# Patient Record
Sex: Male | Born: 1980
Health system: Southern US, Community
[De-identification: ages and names within clinical notes are randomized; demographics above are authoritative.]

## PROBLEM LIST (undated history)

## (undated) DIAGNOSIS — Z0000 Encounter for general adult medical examination without abnormal findings: Secondary | ICD-10-CM

## (undated) DIAGNOSIS — E785 Hyperlipidemia, unspecified: Secondary | ICD-10-CM

## (undated) DIAGNOSIS — K219 Gastro-esophageal reflux disease without esophagitis: Secondary | ICD-10-CM

## (undated) DIAGNOSIS — F71 Moderate intellectual disabilities: Secondary | ICD-10-CM

## (undated) DIAGNOSIS — R109 Unspecified abdominal pain: Secondary | ICD-10-CM

## (undated) DIAGNOSIS — Z22322 Carrier or suspected carrier of Methicillin resistant Staphylococcus aureus: Secondary | ICD-10-CM

## (undated) DIAGNOSIS — R319 Hematuria, unspecified: Secondary | ICD-10-CM

## (undated) DIAGNOSIS — R634 Abnormal weight loss: Secondary | ICD-10-CM

## (undated) DIAGNOSIS — M810 Age-related osteoporosis without current pathological fracture: Secondary | ICD-10-CM

## (undated) HISTORY — DX: Encounter for general adult medical examination without abnormal findings: Z00.00

## (undated) HISTORY — DX: Unspecified abdominal pain: R10.9

## (undated) HISTORY — DX: Hematuria, unspecified: R31.9

## (undated) HISTORY — DX: Gastro-esophageal reflux disease without esophagitis: K21.9

## (undated) HISTORY — DX: Hyperlipidemia, unspecified: E78.5

## (undated) HISTORY — DX: Carrier or suspected carrier of methicillin resistant Staphylococcus aureus: Z22.322

## (undated) HISTORY — DX: Age-related osteoporosis without current pathological fracture: M81.0

## (undated) HISTORY — PX: HIP SURGERY: SHX245

## (undated) HISTORY — DX: Abnormal weight loss: R63.4

---

## 2008-10-11 ENCOUNTER — Emergency Department (HOSPITAL_COMMUNITY): Admission: EM | Admit: 2008-10-11 | Discharge: 2008-10-11 | Payer: Self-pay | Admitting: Emergency Medicine

## 2009-06-24 ENCOUNTER — Emergency Department (HOSPITAL_COMMUNITY): Admission: EM | Admit: 2009-06-24 | Discharge: 2009-06-24 | Payer: Self-pay | Admitting: Emergency Medicine

## 2009-11-18 ENCOUNTER — Emergency Department (HOSPITAL_COMMUNITY): Admission: EM | Admit: 2009-11-18 | Discharge: 2009-11-18 | Payer: Self-pay | Admitting: Emergency Medicine

## 2010-01-20 ENCOUNTER — Emergency Department (HOSPITAL_COMMUNITY): Admission: EM | Admit: 2010-01-20 | Discharge: 2010-01-20 | Payer: Self-pay | Admitting: Emergency Medicine

## 2010-06-24 ENCOUNTER — Encounter: Payer: Self-pay | Admitting: Emergency Medicine

## 2010-08-19 LAB — COMPREHENSIVE METABOLIC PANEL
ALT: 22 U/L (ref 0–53)
Albumin: 3.9 g/dL (ref 3.5–5.2)
Alkaline Phosphatase: 44 U/L (ref 39–117)
CO2: 29 mEq/L (ref 19–32)
Calcium: 9.3 mg/dL (ref 8.4–10.5)
Creatinine, Ser: 0.92 mg/dL (ref 0.4–1.5)
GFR calc non Af Amer: >60 mL/min (ref >60)
Sodium: 137 mEq/L (ref 135–145)
Total Protein: 6.4 g/dL (ref 6.0–8.3)

## 2010-08-19 LAB — DIFFERENTIAL
Basophils Absolute: 0.1 10*3/uL (ref 0.0–0.1)
Basophils Absolute: 0 10*3/uL (ref 0.0–0.1)
Basophils Relative: 1 % (ref 0–1)
Basophils Relative: 0 % (ref 0–1)
Lymphocytes Relative: 28 % (ref 12–46)
Lymphs Abs: 1.5 10*3/uL (ref 0.7–4.0)
Monocytes Relative: 8 % (ref 3–12)
Neutrophils Relative %: 61 % (ref 43–77)

## 2010-08-19 LAB — CBC
HCT: 44.4 % (ref 39.0–52.0)
Hemoglobin: 15.3 g/dL (ref 13.0–17.0)
Hemoglobin: 14.5 g/dL (ref 13.0–17.0)
MCHC: 34.3 g/dL (ref 30.0–36.0)
Platelets: 237 10*3/uL (ref 150–400)
Platelets: 227 10*3/uL (ref 150–400)
RDW: 12.8 % (ref 11.5–15.5)
RDW: 12.8 % (ref 11.5–15.5)
WBC: 5.2 10*3/uL (ref 4.0–10.5)
WBC: 6.7 10*3/uL (ref 4.0–10.5)

## 2010-08-19 LAB — BASIC METABOLIC PANEL
BUN: 8 mg/dL (ref 6–23)
Calcium: 9.3 mg/dL (ref 8.4–10.5)
Chloride: 106 mEq/L (ref 96–112)
GFR calc Af Amer: >60 mL/min (ref >60)
Potassium: 3.6 mEq/L (ref 3.5–5.1)

## 2010-08-19 LAB — URINE MICROSCOPIC-ADD ON

## 2010-08-19 LAB — URINALYSIS, ROUTINE W REFLEX MICROSCOPIC
Protein, ur: NEGATIVE mg/dL
Specific Gravity, Urine: 1.02 (ref 1.005–1.030)
pH: 7 (ref 5.0–8.0)

## 2010-08-19 LAB — POCT CARDIAC MARKERS: Troponin i, poc: <0.05 ng/mL (ref 0.00–0.09)

## 2011-02-16 ENCOUNTER — Encounter: Payer: Self-pay | Admitting: *Deleted

## 2011-02-16 ENCOUNTER — Emergency Department (HOSPITAL_COMMUNITY)
Admission: EM | Admit: 2011-02-16 | Discharge: 2011-02-16 | Disposition: A | Discharge status: Home / Self Care | Payer: Medicaid Other | Attending: Emergency Medicine | Admitting: Emergency Medicine

## 2011-02-16 DIAGNOSIS — J45909 Unspecified asthma, uncomplicated: Secondary | ICD-10-CM | POA: Insufficient documentation

## 2011-02-16 DIAGNOSIS — F79 Unspecified intellectual disabilities: Secondary | ICD-10-CM | POA: Insufficient documentation

## 2011-02-16 DIAGNOSIS — L02419 Cutaneous abscess of limb, unspecified: Secondary | ICD-10-CM

## 2011-02-16 DIAGNOSIS — L539 Erythematous condition, unspecified: Secondary | ICD-10-CM | POA: Insufficient documentation

## 2011-02-16 DIAGNOSIS — E119 Type 2 diabetes mellitus without complications: Secondary | ICD-10-CM | POA: Insufficient documentation

## 2011-02-16 HISTORY — DX: Moderate intellectual disabilities: F71

## 2011-02-16 MED ORDER — DOXYCYCLINE HYCLATE 100 MG PO TABS
100.0000 mg | ORAL_TABLET | Freq: Once | ORAL | Status: AC
  Administered 2011-02-16: 100 mg via ORAL
  Filled 2011-02-16: qty 1

## 2011-02-16 MED ORDER — DOXYCYCLINE HYCLATE 100 MG PO CAPS: 100.0000 mg | ORAL_CAPSULE | Freq: Two times a day (BID) | ORAL | Status: DC

## 2011-02-16 MED ORDER — BACITRACIN ZINC 500 UNIT/GM EX OINT
TOPICAL_OINTMENT | Freq: Once | CUTANEOUS | Status: AC
  Administered 2011-02-16: 17:00:00 via TOPICAL
  Filled 2011-02-16: qty 0.9

## 2011-02-16 NOTE — ED Provider Notes (Signed)
History     CSN: [id number] Arrival date & time: 02/16/2011  3:31 PM   Chief Complaint  Patient presents with  . Abscess     (Include location/radiation/quality/duration/timing/severity/associated sxs/prior treatment) HPI Comments: Mom concerned that lesion may be MRSA.  Patient is a 30 y.o. male presenting with abscess. The history is provided by the patient and a relative. No language interpreter was used.  Abscess  This is a new problem. Episode onset: several days ago. The problem has been gradually improving. The abscess is present on the left lower leg. The problem is mild. Incident location: at a group home. Pertinent negatives include no fever.     Past Medical History  Diagnosis Date  . MR (mental retardation), moderate   . Diabetes mellitus   . Asthma      History reviewed. No pertinent past surgical history.  History reviewed. No pertinent family history.  History  Substance Use Topics  . Smoking status: Never Smoker   . Smokeless tobacco: Not on file  . Alcohol Use: No      Review of Systems  Constitutional: Negative for fever and chills.  Skin: Positive for wound.  All other systems reviewed and are negative.    Allergies  Other  Home Medications   Current Outpatient Rx  Name Route Sig Dispense Refill  . ALBUTEROL SULFATE HFA 108 (90 BASE) MCG/ACT IN AERS Inhalation Inhale 2 puffs into the lungs every 4 (four) hours as needed. Shortness of breath/wheezing     . METFORMIN HCL 500 MG PO TABS Oral Take 500 mg by mouth 2 (two) times daily.      Marland Kitchen MONTELUKAST SODIUM 10 MG PO TABS Oral Take 10 mg by mouth at bedtime.      . OMEPRAZOLE 40 MG PO CPDR Oral Take 40 mg by mouth daily.        Physical Exam    BP 127/80  Pulse 92  Temp(Src) 97.7 F (36.5 C) (Oral)  Resp 20  Ht 5\' 9"  (1.753 m)  Wt 197 lb (89.359 kg)  BMI 29.09 kg/m2  SpO2 99%  Physical Exam  Nursing note and vitals reviewed. Constitutional: He is oriented to person, place, and  time. Vital signs are normal. He appears well-developed and well-nourished.  HENT:  Head: Normocephalic and atraumatic.  Right Ear: External ear normal.  Left Ear: External ear normal.  Nose: Nose normal.  Mouth/Throat: No oropharyngeal exudate.  Eyes: Conjunctivae and EOM are normal. Pupils are equal, round, and reactive to light. Right eye exhibits no discharge. Left eye exhibits no discharge. No scleral icterus.  Neck: Normal range of motion. Neck supple. No JVD present. No tracheal deviation present. No thyromegaly present.  Cardiovascular: Normal rate, regular rhythm, normal heart sounds, intact distal pulses and normal pulses.  Exam reveals no gallop and no friction rub.   No murmur heard. Pulmonary/Chest: Effort normal and breath sounds normal. No stridor. No respiratory distress. He has no wheezes. He has no rales. He exhibits no tenderness.  Abdominal: Soft. Normal appearance and bowel sounds are normal. He exhibits no distension and no mass. There is no tenderness. There is no rebound and no guarding.  Musculoskeletal: Normal range of motion. He exhibits no edema and no tenderness.       Left lower leg: He exhibits tenderness. He exhibits no swelling, no deformity and no laceration.       Legs: Lymphadenopathy:    He has no cervical adenopathy.  Neurological: He is alert and oriented  to person, place, and time. He has normal reflexes. No cranial nerve deficit. Coordination normal. GCS eye subscore is 4. GCS verbal subscore is 5. GCS motor subscore is 6.  Skin: Skin is warm and dry. No rash noted. He is not diaphoretic.  Psychiatric: He has a normal mood and affect. His speech is normal and behavior is normal. Judgment and thought content normal. Cognition and memory are normal.    ED Course  Procedures  Results for orders placed during the hospital encounter of 11/18/09  BASIC METABOLIC PANEL      Component Value Range   Sodium 138  135 - 145 (mEq/L)   Potassium 3.6  3.5 - 5.1  (mEq/L)   Chloride 106  96 - 112 (mEq/L)   CO2 25  19 - 32 (mEq/L)   Glucose, Bld 247 (*) 70 - 99 (mg/dL)   BUN 8  6 - 23 (mg/dL)   Creatinine, Ser 4.78  0.4 - 1.5 (mg/dL)   Calcium 9.3  8.4 - 29.5 (mg/dL)   GFR calc non Af Amer >60  >60 (mL/min)   GFR calc Af Amer    >60 (mL/min)   Value: >60            The eGFR has been calculated     using the MDRD equation.     This calculation has not been     validated in all clinical     situations.     eGFR's persistently     <60 mL/min signify     possible Chronic Kidney Disease.  CBC      Component Value Range   WBC 6.7  4.0 - 10.5 (K/uL)   RBC 4.79  4.22 - 5.81 (MIL/uL)   Hemoglobin 14.5  13.0 - 17.0 (g/dL)   HCT 62.1  30.8 - 65.7 (%)   MCV 88.8  78.0 - 100.0 (fL)   MCHC 34.1  30.0 - 36.0 (g/dL)   RDW 84.6  96.2 - 95.2 (%)   Platelets 227  150 - 400 (K/uL)  DIFFERENTIAL      Component Value Range   Neutrophils Relative 61  43 - 77 (%)   Neutro Abs 4.1  1.7 - 7.7 (K/uL)   Lymphocytes Relative 28  12 - 46 (%)   Lymphs Abs 1.9  0.7 - 4.0 (K/uL)   Monocytes Relative 8  3 - 12 (%)   Monocytes Absolute 0.5  0.1 - 1.0 (K/uL)   Eosinophils Relative 3  0 - 5 (%)   Eosinophils Absolute 0.2  0.0 - 0.7 (K/uL)   Basophils Relative 0  0 - 1 (%)   Basophils Absolute 0.0  0.0 - 0.1 (K/uL)  URINALYSIS, ROUTINE W REFLEX MICROSCOPIC      Component Value Range   Color, Urine YELLOW  YELLOW    Appearance HAZY (*) CLEAR    Specific Gravity, Urine 1.020  1.005 - 1.030    pH 7.0  5.0 - 8.0    Glucose, UA >1000 (*) NEGATIVE (mg/dL)   Hgb urine dipstick NEGATIVE  NEGATIVE    Bilirubin Urine NEGATIVE  NEGATIVE    Ketones, ur TRACE (*) NEGATIVE (mg/dL)   Protein, ur NEGATIVE  NEGATIVE (mg/dL)   Urobilinogen, UA 0.2  0.0 - 1.0 (mg/dL)   Nitrite NEGATIVE  NEGATIVE    Leukocytes, UA NEGATIVE  NEGATIVE   URINE MICROSCOPIC-ADD ON      Component Value Range   Squamous Epithelial / LPF RARE  RARE  WBC, UA 0-2  <3 (WBC/hpf)   RBC / HPF 0-2  <3  (RBC/hpf)   Bacteria, UA RARE  RARE    Crystals AMORPHOUS PHOSPHATES (*) NEGATIVE   POCT CARDIAC MARKERS      Component Value Range   Myoglobin, poc 26.6  12 - 200 (ng/mL)   CKMB, poc <1.0 (*) 1.0 - 8.0 (ng/mL)   Troponin i, poc <0.05  0.00 - 0.09 (ng/mL)   Comment       Value:            TROPONIN VALUES IN THE RANGE     OF 0.00-0.09 ng/mL SHOW     NO INDICATION OF     MYOCARDIAL INJURY.                PERSISTENTLY INCREASED TROPONIN     VALUES IN THE RANGE OF 0.10-0.24     ng/mL CAN BE SEEN IN:           -UNSTABLE ANGINA           -CONGESTIVE HEART FAILURE           -MYOCARDITIS           -CHEST TRAUMA           -ARRYHTHMIAS           -LATE PRESENTING MI           -COPD       CLINICAL FOLLOW-UP RECOMMENDED.                TROPONIN VALUES >=0.25 ng/mL     INDICATE POSSIBLE MYOCARDIAL     ISCHEMIA. SERIAL TESTING     RECOMMENDED.   No results found.   No diagnosis found.   MDM        Worthy Rancher, PA 02/16/11 [phone]

## 2011-02-16 NOTE — ED Notes (Signed)
Pt has red swollen area to lle on back side of leg. Pts mother states pt lives at group home and does not know how long it has been there.

## 2011-02-16 NOTE — ED Provider Notes (Signed)
Medical screening examination/treatment/procedure(s) were performed by non-physician practitioner and as supervising physician I was immediately available for consultation/collaboration.  Adam Hutching, MD 02/16/11 Paulo Fruit

## 2011-06-14 ENCOUNTER — Ambulatory Visit: Payer: Medicare Other | Attending: Family Medicine | Admitting: Physical Therapy

## 2011-06-14 DIAGNOSIS — R42 Dizziness and giddiness: Secondary | ICD-10-CM | POA: Insufficient documentation

## 2011-06-14 DIAGNOSIS — IMO0001 Reserved for inherently not codable concepts without codable children: Secondary | ICD-10-CM | POA: Diagnosis not present

## 2011-06-19 ENCOUNTER — Encounter: Payer: Medicare Other | Admitting: Rehabilitative and Restorative Service Providers"

## 2011-06-24 ENCOUNTER — Ambulatory Visit: Payer: Medicare Other | Admitting: Rehabilitative and Restorative Service Providers"

## 2011-06-24 DIAGNOSIS — R42 Dizziness and giddiness: Secondary | ICD-10-CM | POA: Diagnosis not present

## 2011-06-24 DIAGNOSIS — IMO0001 Reserved for inherently not codable concepts without codable children: Secondary | ICD-10-CM | POA: Diagnosis not present

## 2011-06-26 ENCOUNTER — Ambulatory Visit: Payer: Medicare Other | Admitting: Rehabilitative and Restorative Service Providers"

## 2011-06-26 DIAGNOSIS — R42 Dizziness and giddiness: Secondary | ICD-10-CM | POA: Diagnosis not present

## 2011-06-26 DIAGNOSIS — IMO0001 Reserved for inherently not codable concepts without codable children: Secondary | ICD-10-CM | POA: Diagnosis not present

## 2011-07-03 ENCOUNTER — Encounter: Payer: Medicare Other | Admitting: Physical Therapy

## 2011-07-05 ENCOUNTER — Ambulatory Visit: Payer: Medicare Other | Attending: Family Medicine | Admitting: Physical Therapy

## 2011-07-05 DIAGNOSIS — IMO0001 Reserved for inherently not codable concepts without codable children: Secondary | ICD-10-CM | POA: Diagnosis not present

## 2011-07-05 DIAGNOSIS — R42 Dizziness and giddiness: Secondary | ICD-10-CM | POA: Insufficient documentation

## 2011-07-08 ENCOUNTER — Encounter: Payer: Medicare Other | Admitting: Rehabilitative and Restorative Service Providers"

## 2011-07-10 ENCOUNTER — Ambulatory Visit: Payer: Medicare Other | Admitting: Rehabilitative and Restorative Service Providers"

## 2011-07-12 ENCOUNTER — Encounter: Payer: Medicare Other | Admitting: Rehabilitative and Restorative Service Providers"

## 2011-07-15 ENCOUNTER — Encounter: Payer: Medicare Other | Admitting: Rehabilitative and Restorative Service Providers"

## 2011-07-17 ENCOUNTER — Encounter: Payer: Medicare Other | Admitting: Physical Therapy

## 2011-08-01 DIAGNOSIS — R42 Dizziness and giddiness: Secondary | ICD-10-CM | POA: Diagnosis not present

## 2011-08-01 DIAGNOSIS — H9390 Unspecified disorder of ear, unspecified ear: Secondary | ICD-10-CM | POA: Diagnosis not present

## 2011-10-23 ENCOUNTER — Telehealth: Payer: Self-pay | Admitting: *Deleted

## 2011-10-23 ENCOUNTER — Ambulatory Visit (INDEPENDENT_AMBULATORY_CARE_PROVIDER_SITE_OTHER): Payer: Medicare Other | Admitting: Family Medicine

## 2011-10-23 ENCOUNTER — Encounter: Payer: Self-pay | Admitting: Family Medicine

## 2011-10-23 DIAGNOSIS — IMO0001 Reserved for inherently not codable concepts without codable children: Secondary | ICD-10-CM

## 2011-10-23 DIAGNOSIS — K219 Gastro-esophageal reflux disease without esophagitis: Secondary | ICD-10-CM | POA: Diagnosis not present

## 2011-10-23 DIAGNOSIS — J45909 Unspecified asthma, uncomplicated: Secondary | ICD-10-CM | POA: Insufficient documentation

## 2011-10-23 DIAGNOSIS — F79 Unspecified intellectual disabilities: Secondary | ICD-10-CM

## 2011-10-23 DIAGNOSIS — R319 Hematuria, unspecified: Secondary | ICD-10-CM | POA: Diagnosis not present

## 2011-10-23 DIAGNOSIS — E119 Type 2 diabetes mellitus without complications: Secondary | ICD-10-CM

## 2011-10-23 LAB — CBC WITH DIFFERENTIAL/PLATELET
Eosinophils Relative: 4.2 % (ref 0.0–5.0)
Lymphs Abs: 1.5 10*3/uL (ref 0.7–4.0)
Monocytes Relative: 7.4 % (ref 3.0–12.0)
Neutro Abs: 2.8 10*3/uL (ref 1.4–7.7)
Neutrophils Relative %: 57.9 % (ref 43.0–77.0)
WBC: 4.9 10*3/uL (ref 4.5–10.5)

## 2011-10-23 LAB — BASIC METABOLIC PANEL
BUN: 8 mg/dL (ref 6–23)
Creatinine, Ser: 0.7 mg/dL (ref 0.4–1.5)
GFR: 150.75 mL/min (ref >60.00)
Glucose, Bld: 76 mg/dL (ref 70–99)
Potassium: 5 mEq/L (ref 3.5–5.1)
Sodium: 142 mEq/L (ref 135–145)

## 2011-10-23 LAB — POCT URINALYSIS DIPSTICK
Ketones, UA: NEGATIVE
Nitrite, UA: NEGATIVE
Urobilinogen, UA: 0.2

## 2011-10-23 LAB — HEPATIC FUNCTION PANEL
ALT: 13 U/L (ref 0–53)
Total Bilirubin: 1.9 mg/dL — ABNORMAL HIGH (ref 0.3–1.2)
Total Protein: 7.3 g/dL (ref 6.0–8.3)

## 2011-10-23 LAB — LIPID PANEL
Cholesterol: 161 mg/dL (ref 0–200)
HDL: 46.3 mg/dL (ref >39.00)
Total CHOL/HDL Ratio: 3
Triglycerides: 85 mg/dL (ref 0.0–149.0)

## 2011-10-23 LAB — TSH: TSH: 1.37 u[IU]/mL (ref 0.35–5.50)

## 2011-10-23 MED ORDER — OMEPRAZOLE 20 MG PO CPDR: 20.0000 mg | DELAYED_RELEASE_CAPSULE | Freq: Every day | ORAL | Status: DC

## 2011-10-23 NOTE — Assessment & Plan Note (Signed)
New to provider.  Chronic for pt.  On ARB for renal protection.  UTD on eye exam.  On metformin.  Uncertain as to level of control.  Due for A1C.  Pt now very active and eating well- has lost considerable amount of weight.  Applauded his efforts.  Will follow closely.

## 2011-10-23 NOTE — Patient Instructions (Signed)
Follow up in 3-4 months We'll notify you of your lab results When he needs refills have the pharmacy contact me Take the Omeprazole 20mg  x1 month and then stop We'll call you with your urology appt Call with any questions or concerns Welcome!  We're glad to have you!

## 2011-10-23 NOTE — Assessment & Plan Note (Signed)
New to provider.  Will decrease to 20mg  omeprazole and if sxs remain well controlled will d/c.  Will follow.

## 2011-10-23 NOTE — Assessment & Plan Note (Signed)
New to provider.  Chronic for pt.  Well controlled on Singulair.  Rarely using albuterol inhaler.  Will continue to follow.

## 2011-10-23 NOTE — Assessment & Plan Note (Signed)
New.  High functioning.  Aunt is legal guardian.  Living in group home.

## 2011-10-23 NOTE — Assessment & Plan Note (Signed)
New to provider.  Recurrent for pt.  Pt reports frank blood last week.  Has not seen urology.  UA unremarkable today but given report will refer to uro for complete evaluation.  Pt and aunt in agreement.

## 2011-10-23 NOTE — Progress Notes (Signed)
  Subjective:    Patient ID: Adam Fitzpatrick, male    DOB: Oct 28, 1980, 31 y.o.   MRN: [id number]  HPI New to establish.  Previous MD- [place]ard.  Mental retardation- lives in group home in [place].  Celine Ahr is guardian.  Mom is addicted to prescription drugs and pt was removed from home.  Hematuria- 2 days ago told grandmother he had blood in urine.  Reportedly bright red, a lot, and intermittent.  Occasional dysuria.  Had w/u 1 yr ago in PCP office but never saw urology.  DM- dx'd 5 yrs ago.  On Metformin BID.  Has lost over 50lbs in 2 yrs.  Has completely changed diet and is now involved in [employer] (biking).  CBGs running 70-100s w/ one value of 64.  On low dose ARB for renal protection.  UTD on eye exam.  Asthma- chronic problem, has rarely required albuterol since leaving home (parents were smokers).  Taking Singulair daily.  Denies SOB, wheezing.  No difficulty w/ exercise.  GERD- was started on Omeprazole for abd pain.  Was previously on 20mg , aunt feels like the 40mg  dose may have been increased in error.  Would like to drop back to 20mg  daily.  Denies current sxs- pain, sour brash.   Review of Systems For ROS see HPI     Objective:   Physical Exam  Vitals reviewed. Constitutional: He is oriented to person, place, and time. He appears well-developed and well-nourished. No distress.  HENT:  Head: Normocephalic and atraumatic.  Eyes: Conjunctivae and EOM are normal. Pupils are equal, round, and reactive to light.  Neck: Normal range of motion. Neck supple. No thyromegaly present.  Cardiovascular: Normal rate, regular rhythm, normal heart sounds and intact distal pulses.   No murmur heard. Pulmonary/Chest: Effort normal and breath sounds normal. No respiratory distress.  Abdominal: Soft. Bowel sounds are normal. He exhibits no distension.  Musculoskeletal: He exhibits no edema.  Lymphadenopathy:    He has no cervical adenopathy.  Neurological: He is alert and oriented  to person, place, and time. No cranial nerve deficit.  Skin: Skin is warm and dry.  Psychiatric: He has a normal mood and affect. His behavior is normal.          Assessment & Plan:

## 2011-10-23 NOTE — Telephone Encounter (Signed)
Received incoming call from Terri with [facility] noting new RX for omeprazole 20mg , per notes pt has enough of the 40mg  to last til the end of the month and wanted to know if pt can wait to change til the end of the month to save from paying another co-pay, advised per MD Beverely Low it is ok for the pt to start the new rx of 20mg  Omeprazole the first of next month.

## 2011-10-25 ENCOUNTER — Encounter: Payer: Self-pay | Admitting: *Deleted

## 2011-10-25 ENCOUNTER — Telehealth: Payer: Self-pay | Admitting: *Deleted

## 2011-10-25 LAB — CULTURE, URINE COMPREHENSIVE: Colony Count: NO GROWTH

## 2011-10-25 NOTE — Telephone Encounter (Signed)
Pt aunt left vm stating she understood the results that this nurse read to her this am, however wanted to ask if MD Beverely Low thinks that the pt should still have the Urology apt per noted elevated bilirubin and wondered if the blood in the urine was from bilrubin spill over? Called pt aunt back to discuss, however number noted busy several times when calling, will call again to advise MD Beverely Low is out of the office til 10-30-11 and see if she wants to wait til she gets back to decide? Will try to call again

## 2011-10-29 ENCOUNTER — Telehealth: Payer: Self-pay

## 2011-10-29 NOTE — Telephone Encounter (Signed)
Spoke with patient's aunt Lura Em, Lura Em verbalized understanding of culture results.

## 2011-10-29 NOTE — Telephone Encounter (Signed)
Message copied by Maurice Small on Tue Oct 29, 2011  4:25 PM ------      Message from: Sheliah Hatch      Created: Tue Oct 29, 2011  7:33 AM       No evidence of infxn

## 2011-11-01 NOTE — Telephone Encounter (Signed)
Pt should proceed w/ urology appt b/c the UA tests for protein and bilirubin in the urine (which is how the elevated bilirubin would present) and he notes seeing bright red blood.

## 2011-11-01 NOTE — Telephone Encounter (Signed)
Please advise 

## 2011-11-01 NOTE — Telephone Encounter (Signed)
.  left message to have patient return my call.  

## 2011-11-04 NOTE — Telephone Encounter (Signed)
.  left message to have patient return my call.  

## 2011-11-07 NOTE — Telephone Encounter (Signed)
Several attempts have been made to contact pt with no resolve, sent letter to pt address noted in chart with results/instructions/prescriptions. Advised pt to call office if any questions or concerns per letter. Noted in letter mailed for pt to keep his apt with urology on 11-22-11 per MD Tabori advice

## 2011-11-22 DIAGNOSIS — B356 Tinea cruris: Secondary | ICD-10-CM | POA: Diagnosis not present

## 2011-11-22 DIAGNOSIS — R310 Gross hematuria: Secondary | ICD-10-CM | POA: Diagnosis not present

## 2011-11-26 DIAGNOSIS — R310 Gross hematuria: Secondary | ICD-10-CM | POA: Diagnosis not present

## 2011-11-26 DIAGNOSIS — K7689 Other specified diseases of liver: Secondary | ICD-10-CM | POA: Diagnosis not present

## 2012-03-26 ENCOUNTER — Telehealth: Payer: Self-pay

## 2012-03-26 MED ORDER — OMEPRAZOLE 20 MG PO CPDR: 20.0000 mg | DELAYED_RELEASE_CAPSULE | Freq: Every day | ORAL | Status: DC

## 2012-03-26 NOTE — Telephone Encounter (Signed)
Spoke with Ms. Pruitt when she called in concerning pt. Pruitt asked should she d/c Prilosec or continue after using the one I just sent in? Pruitt explained their is a family history of pt's issue and fears if taking off Prilosec he may have problems at group home. ( I believe based off our conversation Cresenciano Genre is asking this because pt ran out of meds and thought once ran out that meant d/c med and you wouldn't approve anymore.) Plz advise     MW

## 2012-03-26 NOTE — Telephone Encounter (Signed)
.  left message to have patient return my call.  

## 2012-03-26 NOTE — Telephone Encounter (Signed)
Pt should continue to Omeprazole 20mg  daily

## 2012-03-30 NOTE — Telephone Encounter (Signed)
.  left message to have patient return my call on mobile

## 2012-03-31 NOTE — Telephone Encounter (Signed)
.  left message to have patient return my call on pt home number

## 2012-04-03 NOTE — Telephone Encounter (Signed)
Noted waiver in pt chart signed to allow detailed messages to be left on voicemail, left detailed message about: results/instructions/prescribtion information. Advise if any further concerns or questions please call our office at [phone]. Advised directions to take the omeprazole 20mg  daily, to aunt Celene Kras per DPR to leave detailed message on home line and to call office with any further assistance needed

## 2012-05-07 DIAGNOSIS — Z23 Encounter for immunization: Secondary | ICD-10-CM | POA: Diagnosis not present

## 2012-07-23 ENCOUNTER — Other Ambulatory Visit: Payer: Self-pay | Admitting: Family Medicine

## 2012-10-15 ENCOUNTER — Ambulatory Visit (INDEPENDENT_AMBULATORY_CARE_PROVIDER_SITE_OTHER): Payer: Medicare Other | Admitting: Family Medicine

## 2012-10-15 ENCOUNTER — Encounter: Payer: Self-pay | Admitting: Family Medicine

## 2012-10-15 VITALS — BP 104/70 | HR 62 | Temp 98.2°F | Ht 68.5 in | Wt 149.6 lb

## 2012-10-15 DIAGNOSIS — M549 Dorsalgia, unspecified: Secondary | ICD-10-CM | POA: Diagnosis not present

## 2012-10-15 DIAGNOSIS — R21 Rash and other nonspecific skin eruption: Secondary | ICD-10-CM | POA: Diagnosis not present

## 2012-10-15 DIAGNOSIS — Z0000 Encounter for general adult medical examination without abnormal findings: Secondary | ICD-10-CM

## 2012-10-15 DIAGNOSIS — E119 Type 2 diabetes mellitus without complications: Secondary | ICD-10-CM

## 2012-10-15 HISTORY — DX: Encounter for general adult medical examination without abnormal findings: Z00.00

## 2012-10-15 MED ORDER — NAPROXEN 500 MG PO TABS: 500.0000 mg | ORAL_TABLET | Freq: Two times a day (BID) | ORAL | Status: DC

## 2012-10-15 MED ORDER — CLOTRIMAZOLE-BETAMETHASONE 1-0.05 % EX CREA: TOPICAL_CREAM | Freq: Two times a day (BID) | CUTANEOUS | Status: DC

## 2012-10-15 MED ORDER — TIZANIDINE HCL 4 MG PO TABS: 4.0000 mg | ORAL_TABLET | Freq: Three times a day (TID) | ORAL | Status: DC | PRN

## 2012-10-15 NOTE — Assessment & Plan Note (Signed)
Pt's PE WNL w/ exception of tinea cruris and known kyphosis/scoliosis.  Check labs.  Anticipatory guidance provided.

## 2012-10-15 NOTE — Assessment & Plan Note (Signed)
Chronic problem.  Tolerating metformin w/out difficulty.  On ARB for renal protection.  Asymptomatic.  Foot exam WNL.  Encouraged him to schedule eye exam.  Check labs.  Adjust meds prn

## 2012-10-15 NOTE — Patient Instructions (Addendum)
Follow up in 6 months to recheck diabetes Start the Naproxen twice daily x10 days and then as needed for back pain- take w/ food Use the Zanaflex 3x/day as needed for muscle spasm/back pain Apply the cream to the groin twice daily for the fungus and the itching We'll notify you of your lab results and make any changes if needed Schedule an eye exam Call with any questions or concerns Have a great summer!

## 2012-10-15 NOTE — Assessment & Plan Note (Signed)
New.  Tinea cruris.  Start Lotrisone.

## 2012-10-15 NOTE — Assessment & Plan Note (Signed)
New.  Pt w/ known kyphosis and scoliosis.  No distress today.  Will add scheduled NSAIDs and muscle relaxer TID.  Suspect sxs flared w/ swimming.  Will follow.

## 2012-10-15 NOTE — Progress Notes (Signed)
  Subjective:    Patient ID: Adam Fitzpatrick, male    DOB: [birth year], 32 y.o.   MRN: [id number]  HPI Here today for CPE.  Risk Factors: DM- chronic problem, on Metformin twice daily.  CBGs ranging 70-130s.  Due for eye exam.  No symptomatic lows.  No CP, SOB, HAs, visual changes, edema.  + numbness and cold feeling to both feet. Back pain- upper back and mid thoracic, + kyphosis and scoliosis.  Has been swimming for [employer].  sxs x6 weeks.  Has stopped swimming recently.  Has tried tylenol and advil for pain w/out relief. Groin rash- hx of similar previously.  Was treated w/ topical ointment.  Very itchy.  Pt unable to relay duration of rash. Physical Activity: exercising regularly Fall Risk: low Depression: no current sxs Hearing: normal hearing ADL's: can perform tasks independently w/ direction Cognitive: mental retardation Home Safety: lives at group home, feels safe Height, Weight, BMI, Visual Acuity: see vitals, vision corrected to 20/20 w/ glasses Counseling: due for eye exam Labs Ordered: See A&P Care Plan: See A&P    Review of Systems Patient reports no vision/hearing changes, anorexia, fever ,adenopathy, persistant/recurrent hoarseness, swallowing issues, chest pain, palpitations, edema, persistant/recurrent cough, hemoptysis, dyspnea (rest,exertional, paroxysmal nocturnal), gastrointestinal  bleeding (melena, rectal bleeding), abdominal pain, excessive heart burn, GU symptoms (dysuria, hematuria, voiding/incontinence issues) syncope, focal weakness, memory loss, skin/hair/nail changes, depression, anxiety, abnormal bruising/bleeding, musculoskeletal symptoms/signs.     Objective:   Physical Exam BP 104/70  Pulse 62  Temp(Src) 98.2 F (36.8 C) (Oral)  Ht 5' 8.5" (1.74 m)  Wt 149 lb 9.6 oz (67.858 kg)  BMI 22.41 kg/m2  SpO2 98%  General Appearance:    Alert, cooperative, no distress, appears stated age  Head:    Normocephalic, without obvious abnormality,  atraumatic  Eyes:    PERRL, conjunctiva/corneas clear, EOM's intact, fundi    benign, both eyes       Ears:    Normal TM's and external ear canals, both ears  Nose:   Nares normal, septum midline, mucosa normal, no drainage   or sinus tenderness  Throat:   Lips, mucosa, and tongue normal; poor dentition  Neck:   Supple, symmetrical, trachea midline, no adenopathy;       thyroid:  No enlargement/tenderness/nodules  Back:     Kyphosis and scoliosis present w/ paraspinal muscle tenderness and spasm  Lungs:     Clear to auscultation bilaterally, respirations unlabored  Chest wall:    No tenderness or deformity  Heart:    Regular rate and rhythm, S1 and S2 normal, no murmur, rub   or gallop  Abdomen:     Soft, non-tender, bowel sounds active all four quadrants,    no masses, no organomegaly  Genitalia:    deferred  Rectal:    Extremities:   Extremities normal, atraumatic, no cyanosis or edema  Pulses:   2+ and symmetric all extremities  Skin:   Skin color, texture, turgor normal, tinear cruris present  Lymph nodes:   Cervical, supraclavicular, and axillary nodes normal  Neurologic:   CNII-XII intact. Normal strength, sensation and reflexes      throughout          Assessment & Plan:

## 2012-10-16 ENCOUNTER — Encounter: Payer: Self-pay | Admitting: *Deleted

## 2012-10-16 LAB — LIPID PANEL
Cholesterol: 157 mg/dL (ref 0–200)
LDL Cholesterol: 94 mg/dL (ref 0–99)
Total CHOL/HDL Ratio: 4

## 2012-10-16 LAB — CBC WITH DIFFERENTIAL/PLATELET
Basophils Absolute: 0 10*3/uL (ref 0.0–0.1)
Basophils Relative: 0.3 % (ref 0.0–3.0)
Hemoglobin: 14.8 g/dL (ref 13.0–17.0)
Lymphocytes Relative: 21.8 % (ref 12.0–46.0)
Monocytes Relative: 7.1 % (ref 3.0–12.0)
Neutro Abs: 4.9 10*3/uL (ref 1.4–7.7)
RBC: 4.8 Mil/uL (ref 4.22–5.81)
RDW: 12.6 % (ref 11.5–14.6)

## 2012-10-16 LAB — BASIC METABOLIC PANEL
CO2: 31 mEq/L (ref 19–32)
Chloride: 101 mEq/L (ref 96–112)
Creatinine, Ser: 0.7 mg/dL (ref 0.4–1.5)

## 2012-10-16 LAB — HEPATIC FUNCTION PANEL
ALT: 16 U/L (ref 0–53)
AST: 14 U/L (ref 0–37)
Bilirubin, Direct: 0.1 mg/dL (ref 0.0–0.3)
Total Protein: 7.3 g/dL (ref 6.0–8.3)

## 2012-10-16 LAB — HEMOGLOBIN A1C: Hgb A1c MFr Bld: 6.1 % (ref 4.6–6.5)

## 2012-12-10 ENCOUNTER — Other Ambulatory Visit: Payer: Self-pay | Admitting: Family Medicine

## 2013-01-29 ENCOUNTER — Other Ambulatory Visit: Payer: Self-pay | Admitting: General Practice

## 2013-04-08 ENCOUNTER — Other Ambulatory Visit: Payer: Self-pay

## 2013-05-04 DIAGNOSIS — E119 Type 2 diabetes mellitus without complications: Secondary | ICD-10-CM | POA: Diagnosis not present

## 2013-07-05 ENCOUNTER — Encounter (HOSPITAL_COMMUNITY): Payer: Self-pay | Admitting: Emergency Medicine

## 2013-07-05 ENCOUNTER — Emergency Department (INDEPENDENT_AMBULATORY_CARE_PROVIDER_SITE_OTHER)
Admission: EM | Admit: 2013-07-05 | Discharge: 2013-07-05 | Disposition: A | Discharge status: Home / Self Care | Payer: Medicare Other | Source: Home / Self Care

## 2013-07-05 DIAGNOSIS — J069 Acute upper respiratory infection, unspecified: Secondary | ICD-10-CM

## 2013-07-05 MED ORDER — AZITHROMYCIN 250 MG PO TABS: ORAL_TABLET | ORAL | Status: DC

## 2013-07-05 MED ORDER — IPRATROPIUM BROMIDE 0.06 % NA SOLN: 2.0000 | Freq: Four times a day (QID) | NASAL | Status: DC

## 2013-07-05 NOTE — Discharge Instructions (Signed)
Drink plenty of fluids as discussed, use medicine as prescribed- z-pack 2 tabs today then 1 for next 4 days, and atrovent nasal spray 2 sprays each nostril 4 times a day, and delsym 2tsp every 12 hrs for cough. Return or see your doctor if further problems

## 2013-07-05 NOTE — ED Provider Notes (Signed)
CSN: [id number]     Arrival date & time 07/05/13  1931 History   None    Chief Complaint  Patient presents with  . Influenza   (Consider location/radiation/quality/duration/timing/severity/associated sxs/prior Treatment) Patient is a 33 y.o. male presenting with flu symptoms. The history is provided by the patient and a relative.  Influenza Presenting symptoms: cough, fever and rhinorrhea   Presenting symptoms: no nausea, no sore throat and no vomiting   Severity:  Mild Onset quality:  Gradual Duration:  11 days Progression:  Partially resolved Chronicity:  New Associated symptoms: nasal congestion   Risk factors: sick contacts   Risk factors comment:  Lives in a group home.   Past Medical History  Diagnosis Date  . MR (mental retardation), moderate   . Diabetes mellitus   . Asthma   . GERD (gastroesophageal reflux disease)   . Hyperlipidemia     notes only hx of hyperlipidemia  . Blood in urine    History reviewed. No pertinent past surgical history. Family History  Problem Relation Age of Onset  . Alcohol abuse Father   . Cancer Maternal Grandmother   . Hyperlipidemia Maternal Grandmother   . Heart disease Maternal Grandmother   . Hypertension Maternal Grandmother   . Heart disease Maternal Grandfather   . Hypertension Maternal Grandfather   . Hyperlipidemia Maternal Grandfather   . Hyperlipidemia Paternal Grandmother   . Heart disease Paternal Grandmother   . Hypertension Paternal Grandmother   . Kidney disease Paternal Grandmother   . Diabetes Paternal Grandmother   . Heart disease Paternal Grandfather   . Hypertension Paternal Grandfather   . Hyperlipidemia Paternal Grandfather    History  Substance Use Topics  . Smoking status: Never Smoker   . Smokeless tobacco: Not on file  . Alcohol Use: No    Review of Systems  Constitutional: Positive for fever. Negative for appetite change.  HENT: Positive for congestion, postnasal drip, rhinorrhea and sinus  pressure. Negative for sore throat.   Respiratory: Positive for cough.   Cardiovascular: Negative.   Gastrointestinal: Negative.  Negative for nausea and vomiting.    Allergies  Other  Home Medications   Current Outpatient Rx  Name  Route  Sig  Dispense  Refill  . albuterol (PROVENTIL HFA;VENTOLIN HFA) 108 (90 BASE) MCG/ACT inhaler   Inhalation   Inhale 2 puffs into the lungs every 4 (four) hours as needed. Shortness of breath/wheezing          . losartan (COZAAR) 25 MG tablet               . metFORMIN (GLUCOPHAGE) 500 MG tablet   Oral   Take 500 mg by mouth 2 (two) times daily.           . montelukast (SINGULAIR) 10 MG tablet   Oral   Take 10 mg by mouth at bedtime.           Marland Kitchen omeprazole (PRILOSEC) 20 MG capsule      TAKE 1 CAPSULE BY MOUTH ONCE DAILY.   31 capsule   5   . acetaminophen (TYLENOL) 500 MG tablet   Oral   Take 500 mg by mouth every 4 (four) hours as needed for pain.         Marland Kitchen azithromycin (ZITHROMAX Z-PAK) 250 MG tablet      Take as directed on pack   6 each   0   . guaiFENesin-dextromethorphan (ROBITUSSIN DM) 100-10 MG/5ML syrup   Oral   Take  15 mLs by mouth every 4 (four) hours as needed for cough. Take 15cc by mouth every 4 hours prn.         . ipratropium (ATROVENT) 0.06 % nasal spray   Each Nare   Place 2 sprays into both nostrils 4 (four) times daily.   15 mL   1   . loperamide (IMODIUM A-D) 2 MG tablet   Oral   Take 2 mg by mouth as needed for diarrhea or loose stools. Take 2 caplets po or 4 tsp liquid po after first loose stool and 1 caplet po or 1 tsp liquid po for each subsequent stool.         . naproxen (NAPROSYN) 500 MG tablet      TAKE 1 TABLET BY MOUTH TWICE DAILY WITH A MEAL.   62 tablet   0   . tiZANidine (ZANAFLEX) 4 MG tablet   Oral   Take 1 tablet (4 mg total) by mouth every 8 (eight) hours as needed.   60 tablet   1    BP 132/89  Pulse 89  Temp(Src) 98.4 F (36.9 C) (Oral)  Resp 16  SpO2  100% Physical Exam  Nursing note and vitals reviewed. Constitutional: He is oriented to person, place, and time. He appears well-developed and well-nourished.  HENT:  Head: Normocephalic.  Right Ear: External ear normal.  Left Ear: External ear normal.  Mouth/Throat: Oropharynx is clear and moist.  Eyes: Conjunctivae are normal. Pupils are equal, round, and reactive to light.  Neck: Normal range of motion. Neck supple.  Cardiovascular: Normal rate, regular rhythm, normal heart sounds and intact distal pulses.   Pulmonary/Chest: Effort normal and breath sounds normal.  Abdominal: Soft. Bowel sounds are normal. There is no tenderness.  Lymphadenopathy:    He has no cervical adenopathy.  Neurological: He is alert and oriented to person, place, and time.  Skin: Skin is warm and dry.    ED Course  Procedures (including critical care time) Labs Review Labs Reviewed - No data to display Imaging Review No results found.    MDM      Billy Fischer, MD 07/05/13 2046

## 2013-07-05 NOTE — ED Notes (Signed)
C/o  Productive cough with yellow sputum.  Headache.  Sinus pressure and pain.  Diarrhea.  Increase in blood sugars, hx of DM.  Pt has been taking tylenol for fever.  Denies n/v.  Symptoms present x 11 days.

## 2013-08-13 ENCOUNTER — Encounter: Payer: Self-pay | Admitting: Family Medicine

## 2013-08-13 ENCOUNTER — Ambulatory Visit (INDEPENDENT_AMBULATORY_CARE_PROVIDER_SITE_OTHER): Payer: Medicare Other | Admitting: Family Medicine

## 2013-08-13 VITALS — BP 110/74 | HR 71 | Temp 98.4°F | Wt 150.0 lb

## 2013-08-13 DIAGNOSIS — H6590 Unspecified nonsuppurative otitis media, unspecified ear: Secondary | ICD-10-CM

## 2013-08-13 MED ORDER — LORATADINE 10 MG PO TABS: 10.0000 mg | ORAL_TABLET | Freq: Every day | ORAL | Status: DC

## 2013-08-13 MED ORDER — ACETIC ACID 2 % OT SOLN: 4.0000 [drp] | Freq: Three times a day (TID) | OTIC | Status: DC

## 2013-08-13 MED ORDER — AMOXICILLIN-POT CLAVULANATE 875-125 MG PO TABS: 1.0000 | ORAL_TABLET | Freq: Two times a day (BID) | ORAL | Status: DC

## 2013-08-13 MED ORDER — FLUTICASONE PROPIONATE 50 MCG/ACT NA SUSP: 2.0000 | Freq: Every day | NASAL | Status: DC

## 2013-08-13 NOTE — Progress Notes (Signed)
Pre visit review using our clinic review tool, if applicable. No additional management support is needed unless otherwise documented below in the visit note. 

## 2013-08-13 NOTE — Progress Notes (Signed)
  Subjective:     Adam Fitzpatrick is a 33 y.o. male who presents with ear pain and possible ear infection. Symptoms include: bilateral ear pain and plugged sensation in both ears. Onset of symptoms was several weeks ago, and have been gradually worsening since that time. Associated symptoms include: none.  Patient denies: achiness, chills, congestion, coryza, fever , headache, low grade fever, non productive cough, post nasal drip, productive cough, sinus pressure, sneezing and sore throat. He is drinking plenty of fluids.  The following portions of the patient's history were reviewed and updated as appropriate: allergies, current medications, past family history, past medical history, past social history, past surgical history and problem list.  Review of Systems Pertinent items are noted in HPI.   Objective:    BP 110/74  Pulse 71  Temp(Src) 98.4 F (36.9 C) (Oral)  Wt 150 lb (68.04 kg)  SpO2 95% General:  alert, cooperative, appears stated age and no distress  Right Ear: diminished mobility,  Canal--+ swelling and errythema  Left Ear: diminished mobility-- + canal swelling and errythema  Mouth:  lips, mucosa, and tongue normal; teeth and gums normal  Neck: no adenopathy, supple, symmetrical, trachea midline and thyroid not enlarged, symmetric, no tenderness/mass/nodules     Assessment:    Bilateral acute serous otitis media   Plan:    Treatment: Augmentin.-- nasacort and claritin OTC analgesia as needed. Fluids, rest, avoid carbonated/alcoholic and caffeinated beverages.  Follow up in a few days if not improving.

## 2013-08-13 NOTE — Patient Instructions (Signed)
Serous Otitis Media  Serous otitis media is fluid in the middle ear space. This space contains the bones for hearing and air. Air in the middle ear space helps to transmit sound.  The air gets there through the eustachian tube. This tube goes from the back of the nose (nasopharynx) to the middle ear space. It keeps the pressure in the middle ear the same as the outside world. It also helps to drain fluid from the middle ear space. CAUSES  Serous otitis media occurs when the eustachian tube gets blocked. Blockage can come from:  Ear infections.  Colds and other upper respiratory infections.  Allergies.  Irritants such as cigarette smoke.  Sudden changes in air pressure (such as descending in an airplane).  Enlarged adenoids.  A mass in the nasopharynx. During colds and upper respiratory infections, the middle ear space can become temporarily filled with fluid. This can happen after an ear infection also. Once the infection clears, the fluid will generally drain out of the ear through the eustachian tube. If it does not, then serous otitis media occurs. SIGNS AND SYMPTOMS   Hearing loss.  A feeling of fullness in the ear, without pain.  Young children may not show any symptoms but may show slight behavioral changes, such as agitation, ear pulling, or crying. DIAGNOSIS  Serous otitis media is diagnosed by an ear exam. Tests may be done to check on the movement of the eardrum. Hearing exams may also be done. TREATMENT  The fluid most often goes away without treatment. If allergy is the cause, allergy treatment may be helpful. Fluid that persists for several months may require minor surgery. A small tube is placed in the eardrum to:  Drain the fluid.  Restore the air in the middle ear space. In certain situations, antibiotics are used to avoid surgery. Surgery may be done to remove enlarged adenoids (if this is the cause). HOME CARE INSTRUCTIONS   Keep children away from tobacco  smoke.  Be sure to keep any follow-up appointments. SEEK MEDICAL CARE IF:   Your hearing is not better in 3 months.  Your hearing is worse.  You have ear pain.  You have drainage from the ear.  You have dizziness.  You have serous otitis media only in one ear or have any bleeding from your nose (epistaxis).  You notice a lump on your neck. MAKE SURE YOU:  Understand these instructions.   Will watch your condition.   Will get help right away if you are not doing well or get worse.  Document Released: 08/10/2003 Document Revised: 01/20/2013 Document Reviewed: 12/15/2012 [employer] Patient Information 2014 [employer].  

## 2013-09-21 ENCOUNTER — Ambulatory Visit: Payer: Medicare Other | Admitting: Family Medicine

## 2013-10-06 ENCOUNTER — Encounter: Payer: Self-pay | Admitting: Family Medicine

## 2013-10-06 ENCOUNTER — Ambulatory Visit (INDEPENDENT_AMBULATORY_CARE_PROVIDER_SITE_OTHER): Payer: Medicare Other | Admitting: Family Medicine

## 2013-10-06 VITALS — BP 108/80 | HR 77 | Temp 98.2°F | Resp 16 | Wt 150.4 lb

## 2013-10-06 DIAGNOSIS — Z593 Problems related to living in residential institution: Secondary | ICD-10-CM | POA: Insufficient documentation

## 2013-10-06 DIAGNOSIS — E119 Type 2 diabetes mellitus without complications: Secondary | ICD-10-CM | POA: Diagnosis not present

## 2013-10-06 LAB — HEMOGLOBIN A1C: Hgb A1c MFr Bld: 8.2 % — ABNORMAL HIGH (ref 4.6–6.5)

## 2013-10-06 LAB — HEPATIC FUNCTION PANEL
ALBUMIN: 4.2 g/dL (ref 3.5–5.2)
ALT: 13 U/L (ref 0–53)
AST: 14 U/L (ref 0–37)
Alkaline Phosphatase: 51 U/L (ref 39–117)
BILIRUBIN TOTAL: 1.6 mg/dL — AB (ref 0.2–1.2)
Bilirubin, Direct: 0.2 mg/dL (ref 0.0–0.3)
Total Protein: 7.1 g/dL (ref 6.0–8.3)

## 2013-10-06 LAB — BASIC METABOLIC PANEL
BUN: 7 mg/dL (ref 6–23)
CO2: 27 mEq/L (ref 19–32)
CREATININE: 0.7 mg/dL (ref 0.4–1.5)
Calcium: 9.5 mg/dL (ref 8.4–10.5)
Chloride: 102 mEq/L (ref 96–112)
GFR: 130.42 mL/min (ref >60.00)
GLUCOSE: 97 mg/dL (ref 70–99)
Potassium: 3.4 mEq/L — ABNORMAL LOW (ref 3.5–5.1)
Sodium: 137 mEq/L (ref 135–145)

## 2013-10-06 LAB — LIPID PANEL
CHOLESTEROL: 155 mg/dL (ref 0–200)
HDL: 42.9 mg/dL (ref >39.00)
LDL CALC: 92 mg/dL (ref 0–99)
Total CHOL/HDL Ratio: 4
Triglycerides: 102 mg/dL (ref 0.0–149.0)
VLDL: 20.4 mg/dL (ref 0.0–40.0)

## 2013-10-06 LAB — TSH: TSH: 0.71 u[IU]/mL (ref 0.35–4.50)

## 2013-10-06 NOTE — Patient Instructions (Signed)
Schedule your complete physical in 4 months We'll notify you of your lab results and make any changes if needed Keep up the good work! Happy Spring!!

## 2013-10-06 NOTE — Progress Notes (Signed)
Pre visit review using our clinic review tool, if applicable. No additional management support is needed unless otherwise documented below in the visit note. 

## 2013-10-06 NOTE — Assessment & Plan Note (Signed)
Chronic problem.  Pt overdue on labs.  UTD on eye exam.  On ARB for renal protection.  Asymptomatic.  Check labs.  Adjust meds prn.

## 2013-10-06 NOTE — Progress Notes (Signed)
   Subjective:    Patient ID: Adam Fitzpatrick, male    DOB: 10/08/1980, 33 y.o.   MRN: [id number]  HPI DM- chronic problem, on Metformin.  On ARB for renal protection.  Lives in a group home and gets CBGs checked regularly- typically sugars run 90-100s.  Denies symptomatic lows.  UTD on eye exam.  No CP, SOB, HAs, visual changes, edema, numbness/tingling hands.   No N/V/D.  Care management- pt needs FL2 and care plans updated today.   Review of Systems For ROS see HPI     Objective:   Physical Exam  Vitals reviewed. Constitutional: He is oriented to person, place, and time. He appears well-developed and well-nourished. No distress.  HENT:  Head: Normocephalic and atraumatic.  Eyes: Conjunctivae and EOM are normal. Pupils are equal, round, and reactive to light.  Neck: Normal range of motion. Neck supple. No thyromegaly present.  Cardiovascular: Normal rate, regular rhythm, normal heart sounds and intact distal pulses.   No murmur heard. Pulmonary/Chest: Effort normal and breath sounds normal. No respiratory distress.  Abdominal: Soft. Bowel sounds are normal. He exhibits no distension.  Musculoskeletal: He exhibits no edema.  Lymphadenopathy:    He has no cervical adenopathy.  Neurological: He is alert and oriented to person, place, and time. No cranial nerve deficit.  Skin: Skin is warm and dry.  Psychiatric: He has a normal mood and affect. His behavior is normal.          Assessment & Plan:

## 2013-10-06 NOTE — Assessment & Plan Note (Signed)
Completed new FL2, signed care plan and orders.

## 2013-10-06 NOTE — Progress Notes (Signed)
Called the aunt to advise of results.

## 2013-10-08 ENCOUNTER — Encounter: Payer: Self-pay | Admitting: General Practice

## 2013-10-08 MED ORDER — METFORMIN HCL 1000 MG PO TABS: 1000.0000 mg | ORAL_TABLET | Freq: Two times a day (BID) | ORAL | Status: DC

## 2013-10-12 ENCOUNTER — Telehealth: Payer: Self-pay | Admitting: Family Medicine

## 2013-10-12 ENCOUNTER — Encounter: Payer: Self-pay | Admitting: General Practice

## 2013-10-12 NOTE — Telephone Encounter (Signed)
You may leave detailed VM on phone if guardian is unavailable.

## 2013-10-12 NOTE — Telephone Encounter (Signed)
Caller name:  Patys Relation to pt: guardian  Call back number: [phone]   Reason for call:  Legal Guardian wanted to get pt's recent lab results and weight from most current office visit.

## 2013-10-12 NOTE — Telephone Encounter (Signed)
Called and spoke with Adam Fitzpatrick in regards to pt's labs. Notified that I had also mailed her a copy of the labs. Pt guardian also advised that they are using [employer] diabetic supplies.

## 2013-10-14 ENCOUNTER — Telehealth: Payer: Self-pay

## 2013-10-14 NOTE — Telephone Encounter (Signed)
Relevant patient education assigned to patient using Emmi. ° °

## 2013-12-28 ENCOUNTER — Telehealth: Payer: Self-pay | Admitting: Family Medicine

## 2013-12-28 NOTE — Telephone Encounter (Signed)
Caller name: Patsy Relation to HY:QMVH Call back [phone]   Reason for call: Aunt wanted to give a FYI that she  Will not be able to come with pt tomorrow but Kieth Brightly, group home owner, will be bringing him.    Shanon Brow is coming in for a rash, David's mother had the rash too for 4 months.  David did visit with her.  Aunt wants to let us know this update.

## 2013-12-28 NOTE — Telephone Encounter (Signed)
FYI

## 2013-12-29 ENCOUNTER — Ambulatory Visit (INDEPENDENT_AMBULATORY_CARE_PROVIDER_SITE_OTHER): Payer: Medicare Other | Admitting: Medical

## 2013-12-29 ENCOUNTER — Encounter: Payer: Self-pay | Admitting: Medical

## 2013-12-29 VITALS — BP 109/75 | HR 71 | Temp 98.2°F | Wt 147.0 lb

## 2013-12-29 DIAGNOSIS — L089 Local infection of the skin and subcutaneous tissue, unspecified: Secondary | ICD-10-CM | POA: Diagnosis not present

## 2013-12-29 DIAGNOSIS — B86 Scabies: Secondary | ICD-10-CM

## 2013-12-29 DIAGNOSIS — B354 Tinea corporis: Secondary | ICD-10-CM | POA: Diagnosis not present

## 2013-12-29 DIAGNOSIS — L851 Acquired keratosis [keratoderma] palmaris et plantaris: Secondary | ICD-10-CM | POA: Diagnosis not present

## 2013-12-29 DIAGNOSIS — L853 Xerosis cutis: Secondary | ICD-10-CM

## 2013-12-29 MED ORDER — DOXYCYCLINE HYCLATE 100 MG PO TABS: 100.0000 mg | ORAL_TABLET | Freq: Two times a day (BID) | ORAL | Status: DC

## 2013-12-29 MED ORDER — PERMETHRIN 5 % EX CREA: 1.0000 "application " | TOPICAL_CREAM | Freq: Once | CUTANEOUS | Status: DC

## 2013-12-29 NOTE — Assessment & Plan Note (Signed)
Lotrimin bid to all areas. Pt has standing order at group home. Advised to use on group home order form.

## 2013-12-29 NOTE — Assessment & Plan Note (Signed)
Bs 93 today. Continue metformin. If bs increasing with current skin conditions over next week or so please notify us.

## 2013-12-29 NOTE — Patient Instructions (Signed)
Please apply lotrimin otc to lt shoulder ringworm type area, groin regions and toes distal feet between toes.(Apply to these areas bid.) I sent in permethrin to your pharmacy for rash on wrist, waist and ankles. Use as directed. Also I sent doxycycline to your pharmacy for area back of left leg. Apply moisturizer to this area as well twice daily. Follow up in 10 days or as needed. During tx for these condition if bs reading are increasing please notify us.

## 2013-12-29 NOTE — Assessment & Plan Note (Signed)
Not definitive but some characteristic distribution. So rx permethrin. Did advise on group home order that this is not definitive. Treating out of caution. I wrote note to staff to note if other persons in home get characteristic rash. So in that event, they should get evaluated by medical provider.

## 2013-12-29 NOTE — Progress Notes (Signed)
   Subjective:    Patient ID: Adam Fitzpatrick, male    DOB: November 07, 1980, 33 y.o.   MRN: [id number]  HPI   Pt in with some recent rash. Describes lt shoulder area with appearance of ring worm. This area does itch some. Also has rash on groin and feet. These area itch and he use lotrim in past for these areas. Has standing order and can use. But not using recently.   Also he has  scattered small scabs around wrist and forearms.Minimal scab or bump on his rt ankle.(Pt states this present for 5 days approximate) Visited mom and states some scabs around wrist. His aunt thought her rash looked different.   Pt also has area area behind lt knee. Present for 1 wk dry skin. He itched this area a lot and then he noticed slight yellow dc on Monday but none since.  Pt is diabetic.    Review of Systems  Constitutional: Negative for fever, chills and fatigue.  HENT: Negative.   Respiratory: Negative for cough, chest tightness and wheezing.   Cardiovascular: Negative for chest pain and palpitations.  Gastrointestinal: Negative.   Skin: Positive for rash.       See hop. But rash on left shoulder, groin, feet. Wrist, waist, ankle and back of left leg.  Hematological: Negative.        Objective:   Physical Exam  General NAD, pleasant. Skin- lt shoulder anterior aspect. Area of skin typical ringworm appearace. Red slight raised border with central clearing.  -dark pigmented groin rash bilateral. Between toes and top of feet faint red/ pinkish rash.  -small scabs around both wrist. Some distal forearm. Faint few scabs around waist. Only couple of scabs on each anke. -lt posterior leg above poplieal fossae dry patch of skin with scattered scabs faint honey crusting(Earlier in week yellow creamy dc.None now. No fluctuance. No warm, faint tender.         Assessment & Plan:

## 2013-12-29 NOTE — Progress Notes (Signed)
Pre visit review using our clinic review tool, if applicable. No additional management support is needed unless otherwise documented below in the visit note. 

## 2013-12-29 NOTE — Assessment & Plan Note (Addendum)
Rx doxycycline  bid. Area looked dry initially and may have itched then got infection after breaking skin. So advised could use otc moisturizer to area bid.

## 2014-01-10 ENCOUNTER — Encounter: Payer: Self-pay | Admitting: Medical

## 2014-01-10 ENCOUNTER — Ambulatory Visit (INDEPENDENT_AMBULATORY_CARE_PROVIDER_SITE_OTHER): Payer: Medicare Other | Admitting: Medical

## 2014-01-10 VITALS — BP 115/78 | HR 70 | Temp 98.0°F | Wt 147.4 lb

## 2014-01-10 DIAGNOSIS — L089 Local infection of the skin and subcutaneous tissue, unspecified: Secondary | ICD-10-CM | POA: Diagnosis not present

## 2014-01-10 DIAGNOSIS — B86 Scabies: Secondary | ICD-10-CM

## 2014-01-10 DIAGNOSIS — B354 Tinea corporis: Secondary | ICD-10-CM | POA: Diagnosis not present

## 2014-01-10 NOTE — Patient Instructions (Addendum)
Your scabies, skin( bacterial) infection and fungal infections have all resolved. Please follow good hygiene(keep groin, feet and  buttox areas dry) and keep your blood sugars under control. Usual Lotrimin OTC twice daily when necessary for recurrent fungal infections. Followup in September with her PCP for diabetic exam/visit. Otherwise followup as needed.

## 2014-01-10 NOTE — Progress Notes (Signed)
Subjective:    Patient ID: Adam Fitzpatrick, male    DOB: 08/08/1980, 33 y.o.   MRN: [id number]  HPI Prior areas of skin infection have improved greatly. He used lotrimin otc. He used it on his left  shoulder, groin area and buttocks. These areas get better quickly.  The wrist areas, waist and ankle area scabbing got better quickly with scabies medication.  The area on the back of his left leg that appeared to be infected cleared quickly with the antibiotic.  Past Medical History  Diagnosis Date  . MR (mental retardation), moderate   . Diabetes mellitus   . Asthma   . GERD (gastroesophageal reflux disease)   . Hyperlipidemia     notes only hx of hyperlipidemia  . Blood in urine     History   Social History  . Marital Status: Single    Spouse Name: N/A    Number of Children: N/A  . Years of Education: N/A   Occupational History  . Not on file.   Social History Main Topics  . Smoking status: Never Smoker   . Smokeless tobacco: Not on file  . Alcohol Use: No  . Drug Use: No  . Sexual Activity: No   Other Topics Concern  . Not on file   Social History Narrative  . No narrative on file    No past surgical history on file.  Family History  Problem Relation Age of Onset  . Alcohol abuse Father   . Cancer Maternal Grandmother   . Hyperlipidemia Maternal Grandmother   . Heart disease Maternal Grandmother   . Hypertension Maternal Grandmother   . Heart disease Maternal Grandfather   . Hypertension Maternal Grandfather   . Hyperlipidemia Maternal Grandfather   . Hyperlipidemia Paternal Grandmother   . Heart disease Paternal Grandmother   . Hypertension Paternal Grandmother   . Kidney disease Paternal Grandmother   . Diabetes Paternal Grandmother   . Heart disease Paternal Grandfather   . Hypertension Paternal Grandfather   . Hyperlipidemia Paternal Grandfather     Allergies  Allergen Reactions  . Other Anaphylaxis    Shell fish    Current Outpatient  Prescriptions on File Prior to Visit  Medication Sig Dispense Refill  . acetaminophen (TYLENOL) 500 MG tablet Take 500 mg by mouth every 4 (four) hours as needed for pain.      Marland Kitchen albuterol (PROVENTIL HFA;VENTOLIN HFA) 108 (90 BASE) MCG/ACT inhaler Inhale 2 puffs into the lungs every 4 (four) hours as needed. Shortness of breath/wheezing       . fluticasone (FLONASE) 50 MCG/ACT nasal spray Place 2 sprays into both nostrils daily. As needed      . guaiFENesin-dextromethorphan (ROBITUSSIN DM) 100-10 MG/5ML syrup Take 15 mLs by mouth every 4 (four) hours as needed for cough. Take 15cc by mouth every 4 hours prn.      . ipratropium (ATROVENT) 0.06 % nasal spray       . loperamide (IMODIUM A-D) 2 MG tablet Take 2 mg by mouth as needed for diarrhea or loose stools. Take 2 caplets po or 4 tsp liquid po after first loose stool and 1 caplet po or 1 tsp liquid po for each subsequent stool. As needed      . loratadine (CLARITIN) 10 MG tablet Take 1 tablet (10 mg total) by mouth daily.  30 tablet  11  . losartan (COZAAR) 25 MG tablet       . metFORMIN (GLUCOPHAGE) 1000 MG tablet Take  1 tablet (1,000 mg total) by mouth 2 (two) times daily with a meal.  180 tablet  3  . montelukast (SINGULAIR) 10 MG tablet Take 10 mg by mouth at bedtime.        . naproxen (NAPROSYN) 500 MG tablet TAKE 1 TABLET BY MOUTH TWICE DAILY WITH A MEAL. as needed      . omeprazole (PRILOSEC) 20 MG capsule TAKE 1 CAPSULE BY MOUTH ONCE DAILY.  31 capsule  5  . permethrin (ELIMITE) 5 % cream Apply 1 application topically once. Apply to back of neck to soles of feet. Wash off 10-14 hours later. Repeat in 2 wks if necessary.  60 g  0  . tiZANidine (ZANAFLEX) 4 MG tablet Take 1 tablet (4 mg total) by mouth every 8 (eight) hours as needed.  60 tablet  1   No current facility-administered medications on file prior to visit.    BP 115/78  Pulse 70  Temp(Src) 98 F (36.7 C) (Oral)  Wt 147 lb 6.4 oz (66.86 kg)  SpO2 99%     Review of  Systems  Constitutional: Negative for fever, chills and fatigue.  Respiratory: Negative for cough, chest tightness and wheezing.   Cardiovascular: Negative for chest pain and palpitations.  Skin:       Per patient all the areas of his skin are now clear.  Hematological: Negative for adenopathy. Does not bruise/bleed easily.        Objective:   Physical Exam  General-no acute distress. Pleasant patient. Lungs-clear even and unlabored. Heart regular rate and rhythm. Skin-the areas of hyperpigmentation in his groin area and mild redness are now resolved. The same applies for the spaces between his toes. Also the circular area on his left shoulder consistent with tinea corporis has also resolved. In addition the area on the back of his left leg near the popliteal fossa is now normal and there is no drainage or yellow crusting.  Finally, all the small scabbing lesions around his wrist ankles and waist are now cleared.       Assessment & Plan:

## 2014-01-10 NOTE — Assessment & Plan Note (Signed)
Resolved after treatment and I did advise him to keep his sugars under control as hyperglycemia may promote fungal infections. Patient will followup with PCP in September for diabetic visit.

## 2014-01-10 NOTE — Assessment & Plan Note (Signed)
Resolved after treatment

## 2014-01-11 ENCOUNTER — Other Ambulatory Visit: Payer: Self-pay | Admitting: Family Medicine

## 2014-01-11 NOTE — Telephone Encounter (Signed)
Med filled.  

## 2014-02-09 ENCOUNTER — Encounter: Payer: Self-pay | Admitting: General Practice

## 2014-02-09 ENCOUNTER — Other Ambulatory Visit: Payer: Self-pay | Admitting: Family Medicine

## 2014-02-09 ENCOUNTER — Ambulatory Visit (INDEPENDENT_AMBULATORY_CARE_PROVIDER_SITE_OTHER): Payer: Medicare Other | Admitting: Family Medicine

## 2014-02-09 ENCOUNTER — Encounter: Payer: Self-pay | Admitting: Family Medicine

## 2014-02-09 VITALS — BP 110/72 | HR 71 | Temp 98.4°F | Resp 16 | Ht 69.5 in | Wt 146.2 lb

## 2014-02-09 DIAGNOSIS — E119 Type 2 diabetes mellitus without complications: Secondary | ICD-10-CM | POA: Diagnosis not present

## 2014-02-09 DIAGNOSIS — F79 Unspecified intellectual disabilities: Secondary | ICD-10-CM

## 2014-02-09 DIAGNOSIS — Z0000 Encounter for general adult medical examination without abnormal findings: Secondary | ICD-10-CM

## 2014-02-09 DIAGNOSIS — J4520 Mild intermittent asthma, uncomplicated: Secondary | ICD-10-CM

## 2014-02-09 DIAGNOSIS — J45909 Unspecified asthma, uncomplicated: Secondary | ICD-10-CM

## 2014-02-09 LAB — BASIC METABOLIC PANEL
BUN: 11 mg/dL (ref 6–23)
CO2: 30 mEq/L (ref 19–32)
CREATININE: 0.7 mg/dL (ref 0.4–1.5)
Calcium: 9.4 mg/dL (ref 8.4–10.5)
Chloride: 102 mEq/L (ref 96–112)
GFR: 130.13 mL/min (ref >60.00)
GLUCOSE: 102 mg/dL — AB (ref 70–99)
Potassium: 3.7 mEq/L (ref 3.5–5.1)
Sodium: 139 mEq/L (ref 135–145)

## 2014-02-09 LAB — CBC WITH DIFFERENTIAL/PLATELET
BASOS PCT: 0.3 % (ref 0.0–3.0)
Basophils Absolute: 0 10*3/uL (ref 0.0–0.1)
EOS PCT: 4.8 % (ref 0.0–5.0)
Eosinophils Absolute: 0.3 10*3/uL (ref 0.0–0.7)
HCT: 43.5 % (ref 39.0–52.0)
HEMOGLOBIN: 14.6 g/dL (ref 13.0–17.0)
LYMPHS PCT: 25.7 % (ref 12.0–46.0)
Lymphs Abs: 1.5 10*3/uL (ref 0.7–4.0)
MCHC: 33.6 g/dL (ref 30.0–36.0)
MCV: 91.3 fl (ref 78.0–100.0)
MONOS PCT: 5.7 % (ref 3.0–12.0)
Monocytes Absolute: 0.3 10*3/uL (ref 0.1–1.0)
NEUTROS PCT: 63.5 % (ref 43.0–77.0)
Neutro Abs: 3.6 10*3/uL (ref 1.4–7.7)
Platelets: 266 10*3/uL (ref 150.0–400.0)
RBC: 4.76 Mil/uL (ref 4.22–5.81)
RDW: 12.7 % (ref 11.5–15.5)
WBC: 5.7 10*3/uL (ref 4.0–10.5)

## 2014-02-09 LAB — LIPID PANEL
CHOLESTEROL: 151 mg/dL (ref 0–200)
HDL: 49.6 mg/dL (ref >39.00)
LDL CALC: 88 mg/dL (ref 0–99)
NonHDL: 101.4
Total CHOL/HDL Ratio: 3
Triglycerides: 65 mg/dL (ref 0.0–149.0)
VLDL: 13 mg/dL (ref 0.0–40.0)

## 2014-02-09 LAB — HEPATIC FUNCTION PANEL
ALBUMIN: 4.4 g/dL (ref 3.5–5.2)
ALT: 11 U/L (ref 0–53)
AST: 12 U/L (ref 0–37)
Alkaline Phosphatase: 46 U/L (ref 39–117)
BILIRUBIN DIRECT: 0.1 mg/dL (ref 0.0–0.3)
TOTAL PROTEIN: 7.6 g/dL (ref 6.0–8.3)
Total Bilirubin: 1.5 mg/dL — ABNORMAL HIGH (ref 0.2–1.2)

## 2014-02-09 LAB — TSH: TSH: 1.14 u[IU]/mL (ref 0.35–4.50)

## 2014-02-09 LAB — HEMOGLOBIN A1C: Hgb A1c MFr Bld: 6.5 % (ref 4.6–6.5)

## 2014-02-09 NOTE — Progress Notes (Signed)
Pre visit review using our clinic review tool, if applicable. No additional management support is needed unless otherwise documented below in the visit note. 

## 2014-02-09 NOTE — Progress Notes (Signed)
   Subjective:    Patient ID: Adam Fitzpatrick, male    DOB: [birth year], 33 y.o.   MRN: [id number]  HPI Here today for CPE.  Risk Factors: DM- chronic problem, on Metformin.  On ARB for renal protection.  Fasting CBGs 70-80s.  UTD on eye exam (December).  Denies symptomatic lows. Asthma- chronic problem, well controlled on Singulair and Albuterol as needed.  Has not required albuterol use recently Physical Activity: very active in outdoor activities Fall Risk: low risk Depression: denies current sxs Hearing: normal to conversational tones and whispered voice ADL's: independent- group home prepares meals Cognitive: pt w/ known MR Home Safety: safe at group home Height, Weight, BMI, Visual Acuity: see vitals, vision corrected to 20/20 w/ glasses Counseling: too young for DRE or PSA Labs Ordered: See A&P Care Plan: See A&P    Review of Systems Patient reports no vision/hearing changes, anorexia, fever ,adenopathy, persistant/recurrent hoarseness, swallowing issues, chest pain, palpitations, edema, persistant/recurrent cough, hemoptysis, dyspnea (rest,exertional, paroxysmal nocturnal), gastrointestinal  bleeding (melena, rectal bleeding), abdominal pain, excessive heart burn, GU symptoms (dysuria, hematuria, voiding/incontinence issues) syncope, focal weakness, memory loss, numbness & tingling, skin/hair/nail changes, depression, anxiety, abnormal bruising/bleeding, musculoskeletal symptoms/signs.     Objective:   Physical Exam General Appearance:    Alert, cooperative, no distress, appears stated age  Head:    Normocephalic, without obvious abnormality, atraumatic  Eyes:    PERRL, conjunctiva/corneas clear, EOM's intact, fundi    benign, both eyes       Ears:    Normal TM's and external ear canals, both ears  Nose:   Nares normal, septum midline, mucosa normal, no drainage   or sinus tenderness  Throat:   Lips, mucosa, and tongue normal; teeth and gums normal  Neck:   Supple,  symmetrical, trachea midline, no adenopathy;       thyroid:  No enlargement/tenderness/nodules  Back:     Symmetric, no curvature, ROM normal, no CVA tenderness  Lungs:     Clear to auscultation bilaterally, respirations unlabored  Chest wall:    No tenderness or deformity  Heart:    Regular rate and rhythm, S1 and S2 normal, no murmur, rub   or gallop  Abdomen:     Soft, non-tender, bowel sounds active all four quadrants,    no masses, no organomegaly  Genitalia:    Normal male without lesion, masses,discharge or tenderness  Rectal:    Deferred due to young age  Extremities:   Extremities normal, atraumatic, no cyanosis or edema  Pulses:   2+ and symmetric all extremities  Skin:   Skin color, texture, turgor normal, no rashes or lesions  Lymph nodes:   Cervical, supraclavicular, and axillary nodes normal  Neurologic:   CNII-XII intact. Normal strength, sensation and reflexes      throughout          Assessment & Plan:

## 2014-02-09 NOTE — Assessment & Plan Note (Signed)
Pt's PE WNL.  Check labs.  Anticipatory guidance provided.  

## 2014-02-09 NOTE — Telephone Encounter (Signed)
Med filled.  

## 2014-02-09 NOTE — Assessment & Plan Note (Signed)
Ongoing issue for pt.  Safe in group home.  Well cared for by aunt who is guardian.

## 2014-02-09 NOTE — Patient Instructions (Signed)
Follow up in 3-4 months to recheck diabetes We'll notify you of your lab results and make any changes if needed Keep up the good work!  You look great! Call with any questions or concerns Happy Fall!

## 2014-02-09 NOTE — Assessment & Plan Note (Signed)
Chronic problem, adequate control on Singulair w/ rare use of albuterol.  No changes at this time.  Will follow.

## 2014-02-09 NOTE — Assessment & Plan Note (Signed)
Chronic problem.  Tolerating metformin w/o difficulty.  Reported CBGs are excellent.  UTD on eye exam.  On ARB for renal protection.  Check labs.  Adjust meds prn

## 2014-02-10 ENCOUNTER — Encounter: Payer: Self-pay | Admitting: General Practice

## 2014-03-01 ENCOUNTER — Telehealth: Payer: Self-pay

## 2014-03-01 NOTE — Telephone Encounter (Signed)
Caregiver states she will still come and pick up the  Original copy as well.

## 2014-03-01 NOTE — Telephone Encounter (Signed)
Attempted to fax forms twice.  Unable to go through. Called and left a message on caregiver's phone making her aware.  She was encouraged to call back with questions or concerns.  Otherwise, original forms are up front for pick up.

## 2014-03-01 NOTE — Telephone Encounter (Signed)
Application brought into clinic by caregiver, Vivianne Master.  Application completed by Dr. Birdie Riddle.  Caregiver called and made aware that application was available for pick up.  Copy of application made, numbered and placed in scan basket for scanning.  Original placed up front for pick up.

## 2014-03-01 NOTE — Telephone Encounter (Signed)
Forms faxed to [phone].

## 2014-03-01 NOTE — Telephone Encounter (Signed)
Caregiver called back wanting to provide fax # to fax paperwork [phone]

## 2014-04-09 ENCOUNTER — Encounter (HOSPITAL_BASED_OUTPATIENT_CLINIC_OR_DEPARTMENT_OTHER): Payer: Self-pay | Admitting: *Deleted

## 2014-04-09 ENCOUNTER — Inpatient Hospital Stay (HOSPITAL_COMMUNITY): Payer: Medicare Other

## 2014-04-09 ENCOUNTER — Emergency Department (HOSPITAL_BASED_OUTPATIENT_CLINIC_OR_DEPARTMENT_OTHER): Payer: Medicare Other

## 2014-04-09 ENCOUNTER — Inpatient Hospital Stay (HOSPITAL_BASED_OUTPATIENT_CLINIC_OR_DEPARTMENT_OTHER)
Admission: EM | Admit: 2014-04-09 | Discharge: 2014-04-12 | DRG: 482 | Disposition: A | Discharge status: Skilled Nursing Facility | Payer: Medicare Other | Attending: Internal Medicine | Admitting: Internal Medicine

## 2014-04-09 DIAGNOSIS — S72132A Displaced apophyseal fracture of left femur, initial encounter for closed fracture: Secondary | ICD-10-CM | POA: Diagnosis not present

## 2014-04-09 DIAGNOSIS — Z01818 Encounter for other preprocedural examination: Secondary | ICD-10-CM | POA: Diagnosis not present

## 2014-04-09 DIAGNOSIS — W010XXA Fall on same level from slipping, tripping and stumbling without subsequent striking against object, initial encounter: Secondary | ICD-10-CM | POA: Diagnosis present

## 2014-04-09 DIAGNOSIS — B354 Tinea corporis: Secondary | ICD-10-CM

## 2014-04-09 DIAGNOSIS — I10 Essential (primary) hypertension: Secondary | ICD-10-CM | POA: Diagnosis present

## 2014-04-09 DIAGNOSIS — E1140 Type 2 diabetes mellitus with diabetic neuropathy, unspecified: Secondary | ICD-10-CM | POA: Diagnosis not present

## 2014-04-09 DIAGNOSIS — S72142D Displaced intertrochanteric fracture of left femur, subsequent encounter for closed fracture with routine healing: Secondary | ICD-10-CM | POA: Diagnosis not present

## 2014-04-09 DIAGNOSIS — R278 Other lack of coordination: Secondary | ICD-10-CM | POA: Diagnosis not present

## 2014-04-09 DIAGNOSIS — R21 Rash and other nonspecific skin eruption: Secondary | ICD-10-CM

## 2014-04-09 DIAGNOSIS — Y9351 Activity, roller skating (inline) and skateboarding: Secondary | ICD-10-CM | POA: Diagnosis not present

## 2014-04-09 DIAGNOSIS — F71 Moderate intellectual disabilities: Secondary | ICD-10-CM | POA: Diagnosis not present

## 2014-04-09 DIAGNOSIS — Z8249 Family history of ischemic heart disease and other diseases of the circulatory system: Secondary | ICD-10-CM | POA: Diagnosis not present

## 2014-04-09 DIAGNOSIS — F79 Unspecified intellectual disabilities: Secondary | ICD-10-CM

## 2014-04-09 DIAGNOSIS — L853 Xerosis cutis: Secondary | ICD-10-CM

## 2014-04-09 DIAGNOSIS — K219 Gastro-esophageal reflux disease without esophagitis: Secondary | ICD-10-CM | POA: Diagnosis not present

## 2014-04-09 DIAGNOSIS — E119 Type 2 diabetes mellitus without complications: Secondary | ICD-10-CM

## 2014-04-09 DIAGNOSIS — S72142A Displaced intertrochanteric fracture of left femur, initial encounter for closed fracture: Secondary | ICD-10-CM | POA: Diagnosis not present

## 2014-04-09 DIAGNOSIS — Z593 Problems related to living in residential institution: Secondary | ICD-10-CM

## 2014-04-09 DIAGNOSIS — Z833 Family history of diabetes mellitus: Secondary | ICD-10-CM | POA: Diagnosis not present

## 2014-04-09 DIAGNOSIS — Z91013 Allergy to seafood: Secondary | ICD-10-CM | POA: Diagnosis not present

## 2014-04-09 DIAGNOSIS — B86 Scabies: Secondary | ICD-10-CM

## 2014-04-09 DIAGNOSIS — M25552 Pain in left hip: Secondary | ICD-10-CM | POA: Diagnosis not present

## 2014-04-09 DIAGNOSIS — Y92331 Roller skating rink as the place of occurrence of the external cause: Secondary | ICD-10-CM

## 2014-04-09 DIAGNOSIS — W19XXXS Unspecified fall, sequela: Secondary | ICD-10-CM | POA: Diagnosis not present

## 2014-04-09 DIAGNOSIS — M21252 Flexion deformity, left hip: Secondary | ICD-10-CM | POA: Diagnosis not present

## 2014-04-09 DIAGNOSIS — E11349 Type 2 diabetes mellitus with severe nonproliferative diabetic retinopathy without macular edema: Secondary | ICD-10-CM | POA: Diagnosis not present

## 2014-04-09 DIAGNOSIS — T148XXA Other injury of unspecified body region, initial encounter: Secondary | ICD-10-CM

## 2014-04-09 DIAGNOSIS — S72143A Displaced intertrochanteric fracture of unspecified femur, initial encounter for closed fracture: Secondary | ICD-10-CM

## 2014-04-09 DIAGNOSIS — Z9889 Other specified postprocedural states: Secondary | ICD-10-CM | POA: Diagnosis not present

## 2014-04-09 DIAGNOSIS — E785 Hyperlipidemia, unspecified: Secondary | ICD-10-CM | POA: Diagnosis present

## 2014-04-09 DIAGNOSIS — L089 Local infection of the skin and subcutaneous tissue, unspecified: Secondary | ICD-10-CM

## 2014-04-09 DIAGNOSIS — R531 Weakness: Secondary | ICD-10-CM | POA: Diagnosis not present

## 2014-04-09 DIAGNOSIS — Z9181 History of falling: Secondary | ICD-10-CM | POA: Diagnosis not present

## 2014-04-09 DIAGNOSIS — Y998 Other external cause status: Secondary | ICD-10-CM

## 2014-04-09 DIAGNOSIS — R2681 Unsteadiness on feet: Secondary | ICD-10-CM | POA: Diagnosis not present

## 2014-04-09 DIAGNOSIS — S72142S Displaced intertrochanteric fracture of left femur, sequela: Secondary | ICD-10-CM | POA: Diagnosis not present

## 2014-04-09 DIAGNOSIS — Z0000 Encounter for general adult medical examination without abnormal findings: Secondary | ICD-10-CM

## 2014-04-09 LAB — BASIC METABOLIC PANEL
Anion gap: 14 (ref 5–15)
BUN: 11 mg/dL (ref 6–23)
CO2: 24 mEq/L (ref 19–32)
Calcium: 9.8 mg/dL (ref 8.4–10.5)
Chloride: 100 mEq/L (ref 96–112)
Creatinine, Ser: 0.7 mg/dL (ref 0.50–1.35)
GLUCOSE: 155 mg/dL — AB (ref 70–99)
Potassium: 4.4 mEq/L (ref 3.7–5.3)
SODIUM: 138 meq/L (ref 137–147)

## 2014-04-09 LAB — CBC WITH DIFFERENTIAL/PLATELET
BASOS ABS: 0 10*3/uL (ref 0.0–0.1)
BASOS PCT: 0 % (ref 0–1)
EOS ABS: 0 10*3/uL (ref 0.0–0.7)
EOS PCT: 0 % (ref 0–5)
HCT: 39.1 % (ref 39.0–52.0)
Hemoglobin: 13.6 g/dL (ref 13.0–17.0)
Lymphocytes Relative: 5 % — ABNORMAL LOW (ref 12–46)
Lymphs Abs: 0.8 10*3/uL (ref 0.7–4.0)
MCH: 31 pg (ref 26.0–34.0)
MCHC: 34.8 g/dL (ref 30.0–36.0)
MCV: 89.1 fL (ref 78.0–100.0)
Monocytes Absolute: 1 10*3/uL (ref 0.1–1.0)
Monocytes Relative: 7 % (ref 3–12)
NEUTROS PCT: 88 % — AB (ref 43–77)
Neutro Abs: 12.5 10*3/uL — ABNORMAL HIGH (ref 1.7–7.7)
Platelets: 270 10*3/uL (ref 150–400)
RBC: 4.39 MIL/uL (ref 4.22–5.81)
RDW: 11.9 % (ref 11.5–15.5)
WBC: 14.3 10*3/uL — ABNORMAL HIGH (ref 4.0–10.5)

## 2014-04-09 LAB — CBC
HCT: 36.8 % — ABNORMAL LOW (ref 39.0–52.0)
HEMOGLOBIN: 12.8 g/dL — AB (ref 13.0–17.0)
MCH: 30.3 pg (ref 26.0–34.0)
MCHC: 34.8 g/dL (ref 30.0–36.0)
MCV: 87.2 fL (ref 78.0–100.0)
PLATELETS: 278 10*3/uL (ref 150–400)
RBC: 4.22 MIL/uL (ref 4.22–5.81)
RDW: 12.1 % (ref 11.5–15.5)
WBC: 8.9 10*3/uL (ref 4.0–10.5)

## 2014-04-09 LAB — CREATININE, SERUM
CREATININE: 0.66 mg/dL (ref 0.50–1.35)
GFR calc Af Amer: >90 mL/min (ref >90)
GFR calc non Af Amer: >90 mL/min (ref >90)

## 2014-04-09 LAB — GLUCOSE, CAPILLARY
Glucose-Capillary: 103 mg/dL — ABNORMAL HIGH (ref 70–99)
Glucose-Capillary: 260 mg/dL — ABNORMAL HIGH (ref 70–99)

## 2014-04-09 MED ORDER — LOSARTAN POTASSIUM 25 MG PO TABS
25.0000 mg | ORAL_TABLET | Freq: Every day | ORAL | Status: DC
  Administered 2014-04-10 – 2014-04-12 (×3): 25 mg via ORAL
  Filled 2014-04-09 (×3): qty 1

## 2014-04-09 MED ORDER — MORPHINE SULFATE 4 MG/ML IJ SOLN
4.0000 mg | Freq: Once | INTRAMUSCULAR | Status: AC
Start: 2014-04-09 — End: 2014-04-09
  Administered 2014-04-09: 4 mg via INTRAVENOUS
  Filled 2014-04-09: qty 1

## 2014-04-09 MED ORDER — MORPHINE SULFATE 2 MG/ML IJ SOLN
0.5000 mg | INTRAMUSCULAR | Status: DC | PRN
  Administered 2014-04-09 – 2014-04-10 (×5): 0.5 mg via INTRAVENOUS
  Filled 2014-04-09 (×5): qty 1

## 2014-04-09 MED ORDER — ENOXAPARIN SODIUM 40 MG/0.4ML ~~LOC~~ SOLN
40.0000 mg | SUBCUTANEOUS | Status: DC
  Administered 2014-04-10 – 2014-04-11 (×2): 40 mg via SUBCUTANEOUS
  Filled 2014-04-09 (×4): qty 0.4

## 2014-04-09 MED ORDER — HYDROCODONE-ACETAMINOPHEN 5-325 MG PO TABS
1.0000 | ORAL_TABLET | Freq: Four times a day (QID) | ORAL | Status: DC | PRN
  Administered 2014-04-10 – 2014-04-12 (×5): 2 via ORAL
  Filled 2014-04-09 (×6): qty 2

## 2014-04-09 MED ORDER — INSULIN ASPART 100 UNIT/ML ~~LOC~~ SOLN
0.0000 [IU] | Freq: Three times a day (TID) | SUBCUTANEOUS | Status: DC
  Administered 2014-04-10: 3 [IU] via SUBCUTANEOUS
  Administered 2014-04-10 (×2): 2 [IU] via SUBCUTANEOUS
  Administered 2014-04-11: 5 [IU] via SUBCUTANEOUS

## 2014-04-09 NOTE — ED Notes (Signed)
Patient fell while skating [place] [employer], c/o L hip pain, unable to bear weight due to pain

## 2014-04-09 NOTE — ED Provider Notes (Signed)
CSN: [id number]     Arrival date & time 04/09/14  1247 History   First MD Initiated Contact with Patient 04/09/14 1310     Chief Complaint  Patient presents with  . Fall     (Consider location/radiation/quality/duration/timing/severity/associated sxs/prior Treatment) HPI Comments: Pt fell onto L hip while roller [place] [employer] [place]. He has pain & deformity of L prox femur  Patient is a 33 y.o. male presenting with fall. The history is provided by the patient and a caregiver.  Fall This is a new problem. The current episode started 1 to 2 hours ago. The problem occurs rarely. The problem has not changed since onset.Pertinent negatives include no chest pain, no abdominal pain, no headaches and no shortness of breath. Associated symptoms comments: L hip pain . Exacerbated by: movement. The symptoms are relieved by rest. He has tried nothing for the symptoms. The treatment provided no relief.    Past Medical History  Diagnosis Date  . MR (mental retardation), moderate   . Diabetes mellitus   . Asthma   . GERD (gastroesophageal reflux disease)   . Hyperlipidemia     notes only hx of hyperlipidemia  . Blood in urine    History reviewed. No pertinent past surgical history. Family History  Problem Relation Age of Onset  . Alcohol abuse Father   . Cancer Maternal Grandmother   . Hyperlipidemia Maternal Grandmother   . Heart disease Maternal Grandmother   . Hypertension Maternal Grandmother   . Heart disease Maternal Grandfather   . Hypertension Maternal Grandfather   . Hyperlipidemia Maternal Grandfather   . Hyperlipidemia Paternal Grandmother   . Heart disease Paternal Grandmother   . Hypertension Paternal Grandmother   . Kidney disease Paternal Grandmother   . Diabetes Paternal Grandmother   . Heart disease Paternal Grandfather   . Hypertension Paternal Grandfather   . Hyperlipidemia Paternal Grandfather    History  Substance Use Topics  . Smoking status:  Never Smoker   . Smokeless tobacco: Not on file  . Alcohol Use: No    Review of Systems  Constitutional: Negative for fever, activity change, appetite change and fatigue.  HENT: Negative for congestion, facial swelling, rhinorrhea and trouble swallowing.   Eyes: Negative for photophobia and pain.  Respiratory: Negative for cough, chest tightness and shortness of breath.   Cardiovascular: Negative for chest pain and leg swelling.  Gastrointestinal: Negative for nausea, vomiting, abdominal pain, diarrhea and constipation.  Endocrine: Negative for polydipsia and polyuria.  Genitourinary: Negative for dysuria, urgency, decreased urine volume and difficulty urinating.  Musculoskeletal: Negative for back pain and gait problem.  Skin: Negative for color change, rash and wound.  Allergic/Immunologic: Negative for immunocompromised state.  Neurological: Negative for dizziness, facial asymmetry, speech difficulty, weakness, numbness and headaches.  Psychiatric/Behavioral: Negative for confusion, decreased concentration and agitation.      Allergies  Other  Home Medications   Prior to Admission medications   Medication Sig Start Date End Date Taking? Authorizing Provider  acetaminophen (TYLENOL) 500 MG tablet Take 500 mg by mouth every 4 (four) hours as needed for pain.    Historical Provider, MD  albuterol (PROVENTIL HFA;VENTOLIN HFA) 108 (90 BASE) MCG/ACT inhaler Inhale 2 puffs into the lungs every 4 (four) hours as needed. Shortness of breath/wheezing     Historical Provider, MD  guaiFENesin-dextromethorphan (ROBITUSSIN DM) 100-10 MG/5ML syrup Take 15 mLs by mouth every 4 (four) hours as needed for cough. Take 15cc by mouth every 4 hours prn.  Historical Provider, MD  ipratropium (ATROVENT) 0.06 % nasal spray  08/11/13   Historical Provider, MD  loperamide (IMODIUM A-D) 2 MG tablet Take 2 mg by mouth as needed for diarrhea or loose stools. Take 2 caplets po or 4 tsp liquid po after first  loose stool and 1 caplet po or 1 tsp liquid po for each subsequent stool. As needed    Historical Provider, MD  loratadine (CLARITIN) 10 MG tablet Take 1 tablet (10 mg total) by mouth daily. Patient taking differently: Take 10 mg by mouth as needed.  08/13/13   Rosalita Chessman, DO  losartan (COZAAR) 25 MG tablet  10/07/11   Historical Provider, MD  metFORMIN (GLUCOPHAGE) 1000 MG tablet TAKE 1 TABLET BY MOUTH TWICE DAILY WITH A MEAL. 02/09/14   Midge Minium, MD  montelukast (SINGULAIR) 10 MG tablet Take 10 mg by mouth at bedtime.      Historical Provider, MD  naproxen (NAPROSYN) 500 MG tablet TAKE 1 TABLET BY MOUTH TWICE DAILY WITH A MEAL. as needed 12/10/12   Midge Minium, MD  omeprazole (PRILOSEC) 20 MG capsule TAKE 1 CAPSULE BY MOUTH ONCE DAILY. 07/23/12   Midge Minium, MD   BP 125/75 mmHg  Pulse 92  Temp(Src) 99.7 F (37.6 C) (Oral)  Resp 20  SpO2 100% Physical Exam  Constitutional: He is oriented to person, place, and time. He appears well-developed and well-nourished. No distress.  HENT:  Head: Normocephalic and atraumatic.  Mouth/Throat: No oropharyngeal exudate.  Eyes: Pupils are equal, round, and reactive to light.  Neck: Normal range of motion. Neck supple.  Cardiovascular: Normal rate, regular rhythm and normal heart sounds.  Exam reveals no gallop and no friction rub.   No murmur heard. Pulmonary/Chest: Effort normal and breath sounds normal. No respiratory distress. He has no wheezes. He has no rales.  Abdominal: Soft. Bowel sounds are normal. He exhibits no distension and no mass. There is no tenderness. There is no rebound and no guarding.  Musculoskeletal: Normal range of motion. He exhibits no edema.       Left hip: He exhibits tenderness and deformity.  L leg shortened and externally rotated. NVI distally.   Neurological: He is alert and oriented to person, place, and time.  Skin: Skin is warm and dry.  Psychiatric: He has a normal mood and affect.    ED  Course  Procedures (including critical care time) Labs Review Labs Reviewed  CBC WITH DIFFERENTIAL  BASIC METABOLIC PANEL    Imaging Review No results found.   EKG Interpretation None      MDM   Final diagnoses:  Left hip pain  Other non-in-line roller-skating accident, initial encounter    Pt is a 33 y.o. male with Pmhx as above who presents with L proximal femur deformity after fall while roller skating w/ [place] [employer]. NVI distally. No other complaints. XR shows L intertrochanteric hip fracture. Spoke w/ Dr. Percell Miller who will see pt at [facility]. Dr. Doretha Imus will admit to triad. Of note, pt does not have a health care power of attorney.  His Aunt who usually help make medical decisions is [place] town, but on way back to [place] [employer].         Ernestina Patches, MD 04/09/14 2008

## 2014-04-09 NOTE — Progress Notes (Addendum)
33 year old male with a history of moderate mental retardation who lives in a group home, diabetes, GERD who was skating at [employer] event and fell. Has a hip fracture. ED M.D. Has already spoken with Dr. Zeb Comfort on-call.patient has stable vitals, labs are relatively unremarkable. To keep NPO post midnight. Patient accepted to Med-Surg bed, please let orthopedics-Dr. Percell Miller know if patient's arrival when patient gets to [facility].

## 2014-04-09 NOTE — H&P (Signed)
PCP:   Annye Asa, MD   Chief Complaint:    HPI: 33 -year-old male who   has a past medical history of MR (mental retardation), moderate; Diabetes mellitus; Asthma; GERD (gastroesophageal reflux disease); Hyperlipidemia; and Blood in urine. Patient has history of moderate mental toleration and resides at a group home. Today presented to the [facility] [place] after patient fell while skating at [place]. Patient sustained left hip fracture, and was transferred to [facility]. Orthopedics Dr. Percell Miller was consulted by the ED physician, who has already seen the patient. Patient denies passing out, says the pain is controlled at this time. 2/10 in intensity He denies nausea vomiting or diarrhea. No chest pain shortness of breath Patient has history of diabetes mellitus, and takes metformin at home. No history of CAD, no history of seizures. Allergies:   Allergies  Allergen Reactions  . Other Anaphylaxis    Shell fish      Past Medical History  Diagnosis Date  . MR (mental retardation), moderate   . Diabetes mellitus   . Asthma   . GERD (gastroesophageal reflux disease)   . Hyperlipidemia     notes only hx of hyperlipidemia  . Blood in urine     History reviewed. No pertinent past surgical history.  Prior to Admission medications   Medication Sig Start Date End Date Taking? Authorizing Provider  acetaminophen (TYLENOL) 500 MG tablet Take 500 mg by mouth every 4 (four) hours as needed for pain.    Historical Provider, MD  albuterol (PROVENTIL HFA;VENTOLIN HFA) 108 (90 BASE) MCG/ACT inhaler Inhale 2 puffs into the lungs every 4 (four) hours as needed. Shortness of breath/wheezing     Historical Provider, MD  guaiFENesin-dextromethorphan (ROBITUSSIN DM) 100-10 MG/5ML syrup Take 15 mLs by mouth every 4 (four) hours as needed for cough. Take 15cc by mouth every 4 hours prn.    Historical Provider, MD  ipratropium (ATROVENT) 0.06 % nasal spray  08/11/13    Historical Provider, MD  loperamide (IMODIUM A-D) 2 MG tablet Take 2 mg by mouth as needed for diarrhea or loose stools. Take 2 caplets po or 4 tsp liquid po after first loose stool and 1 caplet po or 1 tsp liquid po for each subsequent stool. As needed    Historical Provider, MD  loratadine (CLARITIN) 10 MG tablet Take 1 tablet (10 mg total) by mouth daily. Patient taking differently: Take 10 mg by mouth as needed.  08/13/13   Rosalita Chessman, DO  losartan (COZAAR) 25 MG tablet  10/07/11   Historical Provider, MD  metFORMIN (GLUCOPHAGE) 1000 MG tablet TAKE 1 TABLET BY MOUTH TWICE DAILY WITH A MEAL. 02/09/14   Midge Minium, MD  montelukast (SINGULAIR) 10 MG tablet Take 10 mg by mouth at bedtime.      Historical Provider, MD  naproxen (NAPROSYN) 500 MG tablet TAKE 1 TABLET BY MOUTH TWICE DAILY WITH A MEAL. as needed 12/10/12   Midge Minium, MD  omeprazole (PRILOSEC) 20 MG capsule TAKE 1 CAPSULE BY MOUTH ONCE DAILY. 07/23/12   Midge Minium, MD    Social History:  reports that he has never smoked. He does not have any smokeless tobacco history on file. He reports that he does not drink alcohol or use illicit drugs.  Family History  Problem Relation Age of Onset  . Alcohol abuse Father   . Cancer Maternal Grandmother   . Hyperlipidemia Maternal Grandmother   . Heart disease Maternal Grandmother   .  Hypertension Maternal Grandmother   . Heart disease Maternal Grandfather   . Hypertension Maternal Grandfather   . Hyperlipidemia Maternal Grandfather   . Hyperlipidemia Paternal Grandmother   . Heart disease Paternal Grandmother   . Hypertension Paternal Grandmother   . Kidney disease Paternal Grandmother   . Diabetes Paternal Grandmother   . Heart disease Paternal Grandfather   . Hypertension Paternal Grandfather   . Hyperlipidemia Paternal Grandfather      All the positives are listed in BOLD  Review of Systems:  HEENT: Headache, blurred vision, runny nose, sore throat Neck:  Hypothyroidism, hyperthyroidism,,lymphadenopathy Chest : Shortness of breath, history of COPD, Asthma Heart : Chest pain, history of coronary arterey disease GI:  Nausea, vomiting, diarrhea, constipation, GERD GU: Dysuria, urgency, frequency of urination, hematuria Neuro: Stroke, seizures, syncope Psych: Depression, anxiety, hallucinations   Physical Exam: Blood pressure 122/79, pulse 88, temperature 98.5 F (36.9 C), temperature source Oral, resp. rate 16, height 5\' 10"  (1.778 m), weight 66.225 kg (146 lb), SpO2 100 %. Constitutional:   Patient is a well-developed and well-nourished male * in no acute distress and cooperative with exam. Head: Normocephalic and atraumatic Mouth: Mucus membranes moist Eyes: PERRL, EOMI, conjunctivae normal Neck: Supple, No Thyromegaly Cardiovascular: RRR, S1 normal, S2 normal Pulmonary/Chest: CTAB, no wheezes, rales, or rhonchi Abdominal: Soft. Non-tender, non-distended, bowel sounds are normal, no masses, organomegaly, or guarding present.  Neurological: A&O x3, Strenght is normal and symmetric bilaterally, cranial nerve II-XII are grossly intact, no focal motor deficit, sensory intact to light touch bilaterally.  Extremities : No Cyanosis, Clubbing or Edema  Labs on Admission:  Basic Metabolic Panel:  Recent Labs Lab 04/09/14 1435  NA 138  K 4.4  CL 100  CO2 24  GLUCOSE 155*  BUN 11  CREATININE 0.70  CALCIUM 9.8   Liver Function Tests: No results for input(s): AST, ALT, ALKPHOS, BILITOT, PROT, ALBUMIN in the last 168 hours. No results for input(s): LIPASE, AMYLASE in the last 168 hours. No results for input(s): AMMONIA in the last 168 hours. CBC:  Recent Labs Lab 04/09/14 1435  WBC 14.3*  NEUTROABS 12.5*  HGB 13.6  HCT 39.1  MCV 89.1  PLT 270   Cardiac Enzymes: No results for input(s): CKTOTAL, CKMB, CKMBINDEX, TROPONINI in the last 168 hours.  BNP (last 3 results) No results for input(s): PROBNP in the last 8760  hours. CBG: No results for input(s): GLUCAP in the last 168 hours.  Radiological Exams on Admission: Dg Hip Complete Left  04/09/2014   CLINICAL DATA:  Rollerskating injury, left hip pain  EXAM: LEFT HIP - COMPLETE 2+ VIEW  COMPARISON:  None.  FINDINGS: Intertrochanteric left hip fracture. Foreshortening with varus angulation.  Visualized bony pelvis appears intact.  IMPRESSION: Intertrochanteric left hip fracture.   Electronically Signed   By: Julian Hy M.D.   On: 04/09/2014 14:48   Dg Femur Left  04/09/2014   CLINICAL DATA:  33 year old male unable to move left lower extremity secondary to pain  EXAM: LEFT FEMUR - 2 VIEW  COMPARISON:  Concurrently obtained radiographs of the pelvis  FINDINGS: Intertrochanteric left femoral fracture with varus angulation of the hip joint. The visualized bony pelvis appears intact. The femoral head remains located on the cross-table lateral view. The remainder of the femur and visualize knee are unremarkable. Normal bony mineralization. No liver blastic osseous lesion. No knee joint effusion.  IMPRESSION: Intertrochanteric left femoral fracture with varus angulation.   Electronically Signed   By: Jacqulynn Cadet  M.D.   On: 04/09/2014 14:50       Assessment/Plan Active Problems:   DM type 2 (diabetes mellitus, type 2)   Mental retardation   Intertrochanteric fracture of left hip   Hip fracture  Left intertrochanteric hip fracture Patient will be admitted, will initiate hip fracture protocol Dr. Percell Miller orthopedics consulted Patient will be kept nothing by mouth for surgery tomorrow  Diabetes mellitus We'll hold the metformin, and initiate sliding scale insulin with NovoLog.  Hypertension Continue Cozaar 25 mg by mouth daily  DVT prophylaxis Lovenox  Will obtain chest x-ray and EKG as per protocol for preoperative evaluation  Code status:Full code  Family discussion: No family at bedside.   Time Spent on Admission: 60  minutes  [facility] Pager: [phone] 04/09/2014, 6:08 PM  If 7PM-7AM, please contact night-coverage  [email or web address]  Password [email or web address]

## 2014-04-09 NOTE — Consult Note (Signed)
   ORTHOPAEDIC CONSULTATION  REQUESTING PHYSICIAN: Gagan S Lama, MD  Chief Complaint: left hip fracture  HPI: Adam Fitzpatrick is a 33 y.o. male who complains of a mechanical fall [place]. Pain in the left groin, no other pain  Past Medical History  Diagnosis Date  . MR (mental retardation), moderate   . Diabetes mellitus   . Asthma   . GERD (gastroesophageal reflux disease)   . Hyperlipidemia     notes only hx of hyperlipidemia  . Blood in urine    History reviewed. No pertinent past surgical history. History   Social History  . Marital Status: Single    Spouse Name: N/A    Number of Children: N/A  . Years of Education: N/A   Social History Main Topics  . Smoking status: Never Smoker   . Smokeless tobacco: None  . Alcohol Use: No  . Drug Use: No  . Sexual Activity: No   Other Topics Concern  . None   Social History Narrative   Family History  Problem Relation Age of Onset  . Alcohol abuse Father   . Cancer Maternal Grandmother   . Hyperlipidemia Maternal Grandmother   . Heart disease Maternal Grandmother   . Hypertension Maternal Grandmother   . Heart disease Maternal Grandfather   . Hypertension Maternal Grandfather   . Hyperlipidemia Maternal Grandfather   . Hyperlipidemia Paternal Grandmother   . Heart disease Paternal Grandmother   . Hypertension Paternal Grandmother   . Kidney disease Paternal Grandmother   . Diabetes Paternal Grandmother   . Heart disease Paternal Grandfather   . Hypertension Paternal Grandfather   . Hyperlipidemia Paternal Grandfather    Allergies  Allergen Reactions  . Shellfish Allergy Anaphylaxis   Prior to Admission medications   Medication Sig Start Date End Date Taking? Authorizing Provider  albuterol (PROVENTIL HFA;VENTOLIN HFA) 108 (90 BASE) MCG/ACT inhaler Inhale 2 puffs into the lungs 4 (four) times daily as needed for wheezing or shortness of breath. Shortness of breath/wheezing    Yes Historical Provider, MD  losartan (COZAAR) 25 MG tablet Take 25 mg by mouth daily.  10/07/11  Yes Historical Provider, MD  metFORMIN (GLUCOPHAGE) 1000 MG tablet TAKE 1 TABLET BY MOUTH TWICE DAILY WITH A MEAL. 02/09/14  Yes [name], MD  montelukast (SINGULAIR) 10 MG tablet Take 10 mg by mouth daily.    Yes Historical Provider, MD  naproxen sodium (ALEVE) 220 MG tablet Take 220 mg by mouth every 8 (eight) hours as needed (pain).   Yes Historical Provider, MD  acetaminophen (TYLENOL) 500 MG tablet Take 500 mg by mouth every 4 (four) hours as needed for pain.    Historical Provider, MD  fluticasone (FLONASE) 50 MCG/ACT nasal spray  03/14/14   Historical Provider, MD  guaiFENesin-dextromethorphan (ROBITUSSIN DM) 100-10 MG/5ML syrup Take 15 mLs by mouth every 4 (four) hours as needed for cough. Take 15cc by mouth every 4 hours prn.    Historical Provider, MD  ipratropium (ATROVENT) 0.06 % nasal spray  08/11/13   Historical Provider, MD  loperamide (IMODIUM A-D) 2 MG tablet Take 2 mg by mouth as needed for diarrhea or loose stools. Take 2 caplets po or 4 tsp liquid po after first loose stool and 1 caplet po or 1 tsp liquid po for each subsequent stool. As needed    Historical Provider, MD  loratadine (CLARITIN) 10 MG tablet Take 1 tablet (10 mg total) by mouth daily. Patient taking   differently: Take 10 mg by mouth as needed.  08/13/13   Yvonne R Lowne, DO  naproxen (NAPROSYN) 500 MG tablet TAKE 1 TABLET BY MOUTH TWICE DAILY WITH A MEAL. as needed 12/10/12   [name], MD  omeprazole (PRILOSEC) 20 MG capsule TAKE 1 CAPSULE BY MOUTH ONCE DAILY. 07/23/12   [name], MD   Dg Hip Complete Left  04/09/2014   CLINICAL DATA:  Rollerskating injury, left hip pain  EXAM: LEFT HIP - COMPLETE 2+ VIEW  COMPARISON:  None.  FINDINGS: Intertrochanteric left hip fracture. Foreshortening with varus angulation.  Visualized bony pelvis appears intact.  IMPRESSION: Intertrochanteric left hip fracture.    Electronically Signed   By: Sriyesh  Krishnan M.D.   On: 04/09/2014 14:48   Dg Femur Left  04/09/2014   CLINICAL DATA:  33-year-old male unable to move left lower extremity secondary to pain  EXAM: LEFT FEMUR - 2 VIEW  COMPARISON:  Concurrently obtained radiographs of the pelvis  FINDINGS: Intertrochanteric left femoral fracture with varus angulation of the hip joint. The visualized bony pelvis appears intact. The femoral head remains located on the cross-table lateral view. The remainder of the femur and visualize knee are unremarkable. Normal bony mineralization. No liver blastic osseous lesion. No knee joint effusion.  IMPRESSION: Intertrochanteric left femoral fracture with varus angulation.   Electronically Signed   By: Heath  McCullough M.D.   On: 04/09/2014 14:50    Positive ROS: All other systems have been reviewed and were otherwise negative with the exception of those mentioned in the HPI and as above.  Labs cbc  Recent Labs  04/09/14 1435 04/09/14 1928  WBC 14.3* 8.9  HGB 13.6 12.8*  HCT 39.1 36.8*  PLT 270 278    Labs inflam No results for input(s): CRP in the last 72 hours.  Invalid input(s): ESR  Labs coag No results for input(s): INR, PTT in the last 72 hours.  Invalid input(s): PT   Recent Labs  04/09/14 1435  NA 138  K 4.4  CL 100  CO2 24  GLUCOSE 155*  BUN 11  CREATININE 0.70  CALCIUM 9.8    Physical Exam: Filed Vitals:   04/09/14 1944  BP: 126/86  Pulse: 87  Temp: 99.7 F (37.6 C)  Resp: 16   General: Alert, no acute distress Cardiovascular: No pedal edema Respiratory: No cyanosis, no use of accessory musculature GI: No organomegaly, abdomen is soft and non-tender Skin: No lesions in the area of chief complaint other than those listed below in MSK exam.  Neurologic: Sensation intact distally Psychiatric: Patient is competent for consent with normal mood and affect Lymphatic: No axillary or cervical lymphadenopathy  MUSCULOSKELETAL:    LLE: shortened leg Skin benign, wiggles toes, decreased sensation globally at feet 2/2 diabetic neuropathy Other extremities are atraumatic with painless ROM and NVI.  Assessment: Left intertroch hip fracture  Plan: IM nail on 11/8 Weight Bearing Status: Bedrest for now, WBAT post op PT VTE px: SCD's and hold chemical px tomorrow am.    Yassmine Tamm, D, MD Cell [phone]   04/09/2014 8:01 PM    

## 2014-04-10 ENCOUNTER — Inpatient Hospital Stay (HOSPITAL_COMMUNITY): Payer: Medicare Other

## 2014-04-10 ENCOUNTER — Inpatient Hospital Stay (HOSPITAL_COMMUNITY): Payer: Medicare Other | Admitting: Anesthesiology

## 2014-04-10 ENCOUNTER — Encounter (HOSPITAL_COMMUNITY)
Admission: EM | Disposition: A | Discharge status: Skilled Nursing Facility | Payer: Self-pay | Source: Home / Self Care | Attending: Internal Medicine

## 2014-04-10 ENCOUNTER — Encounter (HOSPITAL_COMMUNITY): Payer: Self-pay | Admitting: Anesthesiology

## 2014-04-10 DIAGNOSIS — I10 Essential (primary) hypertension: Secondary | ICD-10-CM

## 2014-04-10 HISTORY — PX: INTRAMEDULLARY (IM) NAIL INTERTROCHANTERIC: SHX5875

## 2014-04-10 LAB — GLUCOSE, CAPILLARY
GLUCOSE-CAPILLARY: 150 mg/dL — AB (ref 70–99)
GLUCOSE-CAPILLARY: 175 mg/dL — AB (ref 70–99)
GLUCOSE-CAPILLARY: 169 mg/dL — AB (ref 70–99)
GLUCOSE-CAPILLARY: 198 mg/dL — AB (ref 70–99)
GLUCOSE-CAPILLARY: 235 mg/dL — AB (ref 70–99)

## 2014-04-10 LAB — MRSA PCR SCREENING: MRSA by PCR: POSITIVE — AB

## 2014-04-10 SURGERY — FIXATION, FRACTURE, INTERTROCHANTERIC, WITH INTRAMEDULLARY ROD
Anesthesia: General | Site: Hip | Laterality: Left

## 2014-04-10 MED ORDER — ONDANSETRON HCL 4 MG/2ML IJ SOLN: 4.0000 mg | Freq: Four times a day (QID) | INTRAMUSCULAR | Status: DC | PRN

## 2014-04-10 MED ORDER — GLYCOPYRROLATE 0.2 MG/ML IJ SOLN
INTRAMUSCULAR | Status: DC | PRN
  Administered 2014-04-10: 0.6 mg via INTRAVENOUS

## 2014-04-10 MED ORDER — MENTHOL 3 MG MT LOZG: 1.0000 | LOZENGE | OROMUCOSAL | Status: DC | PRN

## 2014-04-10 MED ORDER — ACETAMINOPHEN 325 MG PO TABS: 650.0000 mg | ORAL_TABLET | Freq: Four times a day (QID) | ORAL | Status: DC | PRN

## 2014-04-10 MED ORDER — PHENOL 1.4 % MT LIQD: 1.0000 | OROMUCOSAL | Status: DC | PRN

## 2014-04-10 MED ORDER — MONTELUKAST SODIUM 10 MG PO TABS
10.0000 mg | ORAL_TABLET | Freq: Every day | ORAL | Status: DC
  Administered 2014-04-10 – 2014-04-12 (×3): 10 mg via ORAL
  Filled 2014-04-10 (×3): qty 1

## 2014-04-10 MED ORDER — METOCLOPRAMIDE HCL 10 MG PO TABS: 5.0000 mg | ORAL_TABLET | Freq: Three times a day (TID) | ORAL | Status: DC | PRN

## 2014-04-10 MED ORDER — VANCOMYCIN HCL IN DEXTROSE 1-5 GM/200ML-% IV SOLN
INTRAVENOUS | Status: AC
  Filled 2014-04-10: qty 200

## 2014-04-10 MED ORDER — VANCOMYCIN HCL 1000 MG IV SOLR
1000.0000 mg | INTRAVENOUS | Status: DC | PRN
  Administered 2014-04-10: 1000 mg via INTRAVENOUS

## 2014-04-10 MED ORDER — MIDAZOLAM HCL 5 MG/5ML IJ SOLN
INTRAMUSCULAR | Status: DC | PRN
  Administered 2014-04-10: 2 mg via INTRAVENOUS

## 2014-04-10 MED ORDER — FLUTICASONE PROPIONATE 50 MCG/ACT NA SUSP: 1.0000 | Freq: Every day | NASAL | Status: DC | PRN

## 2014-04-10 MED ORDER — PROPOFOL 10 MG/ML IV BOLUS
INTRAVENOUS | Status: AC
  Filled 2014-04-10: qty 20

## 2014-04-10 MED ORDER — DOCUSATE SODIUM 100 MG PO CAPS: 100.0000 mg | ORAL_CAPSULE | Freq: Two times a day (BID) | ORAL | Status: AC

## 2014-04-10 MED ORDER — LIDOCAINE HCL (CARDIAC) 20 MG/ML IV SOLN
INTRAVENOUS | Status: DC | PRN
  Administered 2014-04-10: 80 mg via INTRAVENOUS

## 2014-04-10 MED ORDER — PROPOFOL 10 MG/ML IV BOLUS
INTRAVENOUS | Status: DC | PRN
  Administered 2014-04-10: 150 mg via INTRAVENOUS

## 2014-04-10 MED ORDER — 0.9 % SODIUM CHLORIDE (POUR BTL) OPTIME
TOPICAL | Status: DC | PRN
  Administered 2014-04-10: 1000 mL

## 2014-04-10 MED ORDER — ACETAMINOPHEN 650 MG RE SUPP: 650.0000 mg | Freq: Four times a day (QID) | RECTAL | Status: DC | PRN

## 2014-04-10 MED ORDER — ASPIRIN EC 325 MG PO TBEC: 325.0000 mg | DELAYED_RELEASE_TABLET | Freq: Every day | ORAL | Status: DC

## 2014-04-10 MED ORDER — HYDROCODONE-ACETAMINOPHEN 5-325 MG PO TABS: 1.0000 | ORAL_TABLET | ORAL | Status: DC | PRN

## 2014-04-10 MED ORDER — ROCURONIUM BROMIDE 50 MG/5ML IV SOLN
INTRAVENOUS | Status: AC
  Filled 2014-04-10: qty 1

## 2014-04-10 MED ORDER — ONDANSETRON HCL 4 MG PO TABS: 4.0000 mg | ORAL_TABLET | Freq: Four times a day (QID) | ORAL | Status: DC | PRN

## 2014-04-10 MED ORDER — METHOCARBAMOL 500 MG PO TABS: 500.0000 mg | ORAL_TABLET | Freq: Four times a day (QID) | ORAL | Status: DC | PRN

## 2014-04-10 MED ORDER — ROCURONIUM BROMIDE 100 MG/10ML IV SOLN
INTRAVENOUS | Status: DC | PRN
  Administered 2014-04-10: 40 mg via INTRAVENOUS

## 2014-04-10 MED ORDER — LACTATED RINGERS IV SOLN
INTRAVENOUS | Status: DC | PRN
  Administered 2014-04-10 (×2): via INTRAVENOUS

## 2014-04-10 MED ORDER — HYDROMORPHONE HCL 1 MG/ML IJ SOLN
INTRAMUSCULAR | Status: AC
Start: 2014-04-10 — End: 2014-04-10
  Administered 2014-04-10: 0.25 mg via INTRAVENOUS
  Filled 2014-04-10: qty 1

## 2014-04-10 MED ORDER — CEFAZOLIN SODIUM-DEXTROSE 2-3 GM-% IV SOLR
2.0000 g | Freq: Four times a day (QID) | INTRAVENOUS | Status: AC
  Administered 2014-04-10 (×2): 2 g via INTRAVENOUS
  Filled 2014-04-10 (×2): qty 50

## 2014-04-10 MED ORDER — FENTANYL CITRATE 0.05 MG/ML IJ SOLN
INTRAMUSCULAR | Status: AC
  Filled 2014-04-10: qty 5

## 2014-04-10 MED ORDER — HYDROMORPHONE HCL 1 MG/ML IJ SOLN
0.2500 mg | INTRAMUSCULAR | Status: DC | PRN
  Administered 2014-04-10 (×2): 0.25 mg via INTRAVENOUS

## 2014-04-10 MED ORDER — PHENYLEPHRINE HCL 10 MG/ML IJ SOLN
INTRAMUSCULAR | Status: DC | PRN
  Administered 2014-04-10 (×3): 80 ug via INTRAVENOUS

## 2014-04-10 MED ORDER — MIDAZOLAM HCL 2 MG/2ML IJ SOLN
INTRAMUSCULAR | Status: AC
  Filled 2014-04-10: qty 2

## 2014-04-10 MED ORDER — FENTANYL CITRATE 0.05 MG/ML IJ SOLN
INTRAMUSCULAR | Status: DC | PRN
  Administered 2014-04-10 (×2): 50 ug via INTRAVENOUS

## 2014-04-10 MED ORDER — NEOSTIGMINE METHYLSULFATE 10 MG/10ML IV SOLN
INTRAVENOUS | Status: DC | PRN
  Administered 2014-04-10: 4 mg via INTRAVENOUS

## 2014-04-10 MED ORDER — PANTOPRAZOLE SODIUM 40 MG PO TBEC
40.0000 mg | DELAYED_RELEASE_TABLET | Freq: Every day | ORAL | Status: DC
  Administered 2014-04-10 – 2014-04-12 (×3): 40 mg via ORAL
  Filled 2014-04-10 (×3): qty 1

## 2014-04-10 MED ORDER — ASPIRIN EC 325 MG PO TBEC
325.0000 mg | DELAYED_RELEASE_TABLET | Freq: Every day | ORAL | Status: DC
  Administered 2014-04-11 – 2014-04-12 (×2): 325 mg via ORAL
  Filled 2014-04-10 (×3): qty 1

## 2014-04-10 MED ORDER — METOCLOPRAMIDE HCL 5 MG/ML IJ SOLN: 5.0000 mg | Freq: Three times a day (TID) | INTRAMUSCULAR | Status: DC | PRN

## 2014-04-10 MED ORDER — ONDANSETRON HCL 4 MG/2ML IJ SOLN
INTRAMUSCULAR | Status: AC
  Filled 2014-04-10: qty 2

## 2014-04-10 MED ORDER — ONDANSETRON HCL 4 MG/2ML IJ SOLN
INTRAMUSCULAR | Status: DC | PRN
  Administered 2014-04-10: 4 mg via INTRAVENOUS

## 2014-04-10 SURGICAL SUPPLY — 43 items
CLOSURE STERI-STRIP 1/2X4 (GAUZE/BANDAGES/DRESSINGS) ×2
CLSR STERI-STRIP ANTIMIC 1/2X4 (GAUZE/BANDAGES/DRESSINGS) ×4 IMPLANT
COVER MAYO STAND STRL (DRAPES) ×3 IMPLANT
COVER PERINEAL POST (MISCELLANEOUS) ×3 IMPLANT
COVER SURGICAL LIGHT HANDLE (MISCELLANEOUS) ×3 IMPLANT
DRAPE STERI IOBAN 125X83 (DRAPES) ×3 IMPLANT
DRSG ADAPTIC 3X8 NADH LF (GAUZE/BANDAGES/DRESSINGS) ×3 IMPLANT
DRSG MEPILEX BORDER 4X4 (GAUZE/BANDAGES/DRESSINGS) ×6 IMPLANT
DRSG TEGADERM 2-3/8X2-3/4 SM (GAUZE/BANDAGES/DRESSINGS) ×3 IMPLANT
DRSG TEGADERM 4X4.75 (GAUZE/BANDAGES/DRESSINGS) ×3 IMPLANT
DURAPREP 26ML APPLICATOR (WOUND CARE) ×3 IMPLANT
ELECT REM PT RETURN 9FT ADLT (ELECTROSURGICAL) ×3
ELECTRODE REM PT RTRN 9FT ADLT (ELECTROSURGICAL) ×1 IMPLANT
GAUZE SPONGE 4X4 12PLY STRL (GAUZE/BANDAGES/DRESSINGS) ×3 IMPLANT
GAUZE XEROFORM 5X9 LF (GAUZE/BANDAGES/DRESSINGS) ×3 IMPLANT
GLOVE BIO SURGEON STRL SZ7.5 (GLOVE) ×3 IMPLANT
GLOVE BIOGEL PI IND STRL 8 (GLOVE) ×1 IMPLANT
GLOVE BIOGEL PI INDICATOR 8 (GLOVE) ×2
GOWN STRL REUS W/ TWL LRG LVL3 (GOWN DISPOSABLE) ×1 IMPLANT
GOWN STRL REUS W/TWL LRG LVL3 (GOWN DISPOSABLE) ×3
GUIDEROD T2 3X1000 (ROD) ×3 IMPLANT
K-WIRE  3.2X450M STR (WIRE) ×2
K-WIRE 3.2X450M STR (WIRE) ×1
KIT NAIL LONG 10X140X125 (Nail) ×3 IMPLANT
KIT ROOM TURNOVER OR (KITS) ×3 IMPLANT
KWIRE 3.2X450M STR (WIRE) ×1 IMPLANT
LINER BOOT UNIVERSAL DISP (MISCELLANEOUS) ×3 IMPLANT
MANIFOLD NEPTUNE II (INSTRUMENTS) ×3 IMPLANT
NS IRRIG 1000ML POUR BTL (IV SOLUTION) ×3 IMPLANT
PACK GENERAL/GYN (CUSTOM PROCEDURE TRAY) ×3 IMPLANT
PAD ARMBOARD 7.5X6 YLW CONV (MISCELLANEOUS) ×6 IMPLANT
REAMER SHAFT BIXCUT (INSTRUMENTS) ×3 IMPLANT
SCREW LAG GAMMA 3 TI 10.5X105M (Screw) ×3 IMPLANT
STAPLER VISISTAT 35W (STAPLE) ×3 IMPLANT
SUT MNCRL AB 4-0 PS2 18 (SUTURE) IMPLANT
SUT MON AB 2-0 CT1 27 (SUTURE) ×3 IMPLANT
SUT MON AB 2-0 CT1 36 (SUTURE) ×3 IMPLANT
SUT VIC AB 0 CT1 27 (SUTURE) ×3
SUT VIC AB 0 CT1 27XBRD ANBCTR (SUTURE) ×1 IMPLANT
TOWEL OR 17X24 6PK STRL BLUE (TOWEL DISPOSABLE) ×3 IMPLANT
TOWEL OR 17X26 10 PK STRL BLUE (TOWEL DISPOSABLE) ×3 IMPLANT
TOWEL OR NON WOVEN STRL DISP B (DISPOSABLE) ×3 IMPLANT
WATER STERILE IRR 1000ML POUR (IV SOLUTION) IMPLANT

## 2014-04-10 NOTE — Anesthesia Postprocedure Evaluation (Signed)
  Anesthesia Post-op Note  Patient: Adam Fitzpatrick  Procedure(s) Performed: Procedure(s): INTRAMEDULLARY (IM) NAIL INTERTROCHANTRIC LEFT HIP (Left)  Patient Location: PACU  Anesthesia Type:General  Level of Consciousness: awake  Airway and Oxygen Therapy: Patient Spontanous Breathing  Post-op Pain: mild  Post-op Assessment: Post-op Vital signs reviewed  Post-op Vital Signs: Reviewed  Last Vitals:  Filed Vitals:   04/10/14 1001  BP: 133/93  Pulse: 73  Temp:   Resp: 14    Complications: No apparent anesthesia complications

## 2014-04-10 NOTE — Progress Notes (Signed)
Patient and patients aunt confirmed that they are both aware of the surgical procedure to take place on 04/10/2014. Patients aunt informed RN that patient is competent to sign surgical consent and blood consent. There are no surgical consent orders as of yet. Dr. Alain Marion visited with the patient on 04/09/2014 during day shift and will re-visit with the patient before surgery on 04/10/2014 as reported by Candace Cruise, RN. Night RN will perform PCR swab on patient. Patients aunt stated that she spoke with Dr. Percell Miller on the phone and that he stated that the patient will have a left IM nail on 04/10/2014. Nursing will continue to monitor the status of the patient.

## 2014-04-10 NOTE — Anesthesia Procedure Notes (Signed)
Procedure Name: Intubation Date/Time: 04/10/2014 7:43 AM Performed by: Neldon Newport Pre-anesthesia Checklist: Patient being monitored, Suction available, Emergency Drugs available, Patient identified and Timeout performed Patient Re-evaluated:Patient Re-evaluated prior to inductionOxygen Delivery Method: Circle system utilized Preoxygenation: Pre-oxygenation with 100% oxygen Intubation Type: IV induction Ventilation: Mask ventilation without difficulty Laryngoscope Size: Mac and 3 Grade View: Grade I Tube type: Oral Tube size: 7.5 mm Number of attempts: 1 Placement Confirmation: ETT inserted through vocal cords under direct vision,  breath sounds checked- equal and bilateral and positive ETCO2 Secured at: 23 cm Tube secured with: Tape Dental Injury: Teeth and Oropharynx as per pre-operative assessment

## 2014-04-10 NOTE — Op Note (Signed)
DATE OF SURGERY:  04/10/2014  TIME: 9:25 AM  PATIENT NAME:  Adam Fitzpatrick  AGE: 33 y.o.  PRE-OPERATIVE DIAGNOSIS:  LEFT HIP FX  POST-OPERATIVE DIAGNOSIS:  SAME  PROCEDURE:  INTRAMEDULLARY (IM) NAIL INTERTROCHANTRIC LEFT HIP  SURGEON:  MURPHY, TIMOTHY, D  ASSISTANT: none   OPERATIVE IMPLANTS: Stryker Gamma Nail with distal interlock screw  PREOPERATIVE INDICATIONS:  Adam Fitzpatrick is a 33 y.o. year old who fell and suffered a hip fracture. He was brought into the ER and then admitted and optimized and then elected for surgical intervention.    The risks benefits and alternatives were discussed with the patient including but not limited to the risks of nonoperative treatment, versus surgical intervention including infection, bleeding, nerve injury, malunion, nonunion, hardware prominence, hardware failure, need for hardware removal, blood clots, cardiopulmonary complications, morbidity, mortality, among others, and they were willing to proceed.    OPERATIVE PROCEDURE:  The patient was brought to the operating room and placed in the supine position. General anesthesia was administered, with a foley. He was placed on the fracture table.  Closed reduction was performed under C-arm guidance. The length of the femur was also measured using fluoroscopy. Time out was then performed after sterile prep and drape. He received preoperative antibiotics.  Incision was made proximal to the greater trochanter. A guidewire was placed in the appropriate position. Confirmation was made on AP and lateral views. The above-named nail was opened. I opened the proximal femur with a reamer. I then placed the nail by hand easily down. I did not need to ream the femur.  Once the nail was completely seated, I placed a guidepin into the femoral head into the center center position. I measured the length, and then reamed the lateral cortex and up into the head. I then placed the lag screw. Slight compression was  applied. Anatomic fixation achieved. Bone quality was mediocre.  I then secured the proximal interlocking bolt, and took off a half a turn, and then removed the instruments, and took final C-arm pictures AP and lateral the entire length of the leg.   Anatomic reconstruction was achieved, and the wounds were irrigated copiously and closed with Vicryl followed by staples and sterile gauze for the skin. The patient was awakened and returned to PACU in stable and satisfactory condition. There no complications and the patient tolerated the procedure well.  He will be weightbearing as tolerated, and will be on ASA 325  for a period of four weeks after discharge.   Adam Fitzpatrick, M.D.    This note was generated using a template and dragon dictation system. In light of that, I have reviewed the note and all aspects of it are applicable to this case. Any dictation errors are due to the computerized dictation system.

## 2014-04-10 NOTE — Transfer of Care (Signed)
Immediate Anesthesia Transfer of Care Note  Patient: Adam Fitzpatrick  Procedure(s) Performed: Procedure(s): INTRAMEDULLARY (IM) NAIL INTERTROCHANTRIC LEFT HIP (Left)  Patient Location: PACU  Anesthesia Type:General  Level of Consciousness: awake, alert  and oriented  Airway & Oxygen Therapy: Patient Spontanous Breathing and Patient connected to nasal cannula oxygen  Post-op Assessment: Report given to PACU RN, Post -op Vital signs reviewed and stable and Patient moving all extremities X 4  Post vital signs: Reviewed and stable  Complications: No apparent anesthesia complications

## 2014-04-10 NOTE — Progress Notes (Signed)
Orthopedic Tech Progress Note Patient Details:  Adam Fitzpatrick [birth year] [id number] OHF applied to bed.  Patient ID: Adam Fitzpatrick, male   DOB: 1981/04/08, 33 y.o.   MRN: [id number]   [name] 04/10/2014, 12:59 PM

## 2014-04-10 NOTE — Discharge Instructions (Signed)
Bear weight as tolerated  Take aspirin 325 daily for 30 days  Keep incisions dry and covered until follow up

## 2014-04-10 NOTE — Interval H&P Note (Signed)
History and Physical Interval Note:  04/10/2014 7:12 AM  Adam Fitzpatrick  has presented today for surgery, with the diagnosis of HIP FX  The various methods of treatment have been discussed with the patient and family. After consideration of risks, benefits and other options for treatment, the patient has consented to  Procedure(s): INTRAMEDULLARY (IM) NAIL INTERTROCHANTRIC LEFT HIP (Left) as a surgical intervention .  The patient's history has been reviewed, patient examined, no change in status, stable for surgery.  I have reviewed the patient's chart and labs.  Questions were answered to the patient's satisfaction.     Reis Pienta, D

## 2014-04-10 NOTE — H&P (View-Only) (Signed)
   ORTHOPAEDIC CONSULTATION  REQUESTING PHYSICIAN: Gagan S Lama, MD  Chief Complaint: left hip fracture  HPI: Adam Fitzpatrick is a 33 y.o. male who complains of a mechanical fall [place]. Pain in the left groin, no other pain  Past Medical History  Diagnosis Date  . MR (mental retardation), moderate   . Diabetes mellitus   . Asthma   . GERD (gastroesophageal reflux disease)   . Hyperlipidemia     notes only hx of hyperlipidemia  . Blood in urine    History reviewed. No pertinent past surgical history. History   Social History  . Marital Status: Single    Spouse Name: N/A    Number of Children: N/A  . Years of Education: N/A   Social History Main Topics  . Smoking status: Never Smoker   . Smokeless tobacco: None  . Alcohol Use: No  . Drug Use: No  . Sexual Activity: No   Other Topics Concern  . None   Social History Narrative   Family History  Problem Relation Age of Onset  . Alcohol abuse Father   . Cancer Maternal Grandmother   . Hyperlipidemia Maternal Grandmother   . Heart disease Maternal Grandmother   . Hypertension Maternal Grandmother   . Heart disease Maternal Grandfather   . Hypertension Maternal Grandfather   . Hyperlipidemia Maternal Grandfather   . Hyperlipidemia Paternal Grandmother   . Heart disease Paternal Grandmother   . Hypertension Paternal Grandmother   . Kidney disease Paternal Grandmother   . Diabetes Paternal Grandmother   . Heart disease Paternal Grandfather   . Hypertension Paternal Grandfather   . Hyperlipidemia Paternal Grandfather    Allergies  Allergen Reactions  . Shellfish Allergy Anaphylaxis   Prior to Admission medications   Medication Sig Start Date End Date Taking? Authorizing Provider  albuterol (PROVENTIL HFA;VENTOLIN HFA) 108 (90 BASE) MCG/ACT inhaler Inhale 2 puffs into the lungs 4 (four) times daily as needed for wheezing or shortness of breath. Shortness of breath/wheezing    Yes Historical Provider, MD  losartan (COZAAR) 25 MG tablet Take 25 mg by mouth daily.  10/07/11  Yes Historical Provider, MD  metFORMIN (GLUCOPHAGE) 1000 MG tablet TAKE 1 TABLET BY MOUTH TWICE DAILY WITH A MEAL. 02/09/14  Yes [name], MD  montelukast (SINGULAIR) 10 MG tablet Take 10 mg by mouth daily.    Yes Historical Provider, MD  naproxen sodium (ALEVE) 220 MG tablet Take 220 mg by mouth every 8 (eight) hours as needed (pain).   Yes Historical Provider, MD  acetaminophen (TYLENOL) 500 MG tablet Take 500 mg by mouth every 4 (four) hours as needed for pain.    Historical Provider, MD  fluticasone (FLONASE) 50 MCG/ACT nasal spray  03/14/14   Historical Provider, MD  guaiFENesin-dextromethorphan (ROBITUSSIN DM) 100-10 MG/5ML syrup Take 15 mLs by mouth every 4 (four) hours as needed for cough. Take 15cc by mouth every 4 hours prn.    Historical Provider, MD  ipratropium (ATROVENT) 0.06 % nasal spray  08/11/13   Historical Provider, MD  loperamide (IMODIUM A-D) 2 MG tablet Take 2 mg by mouth as needed for diarrhea or loose stools. Take 2 caplets po or 4 tsp liquid po after first loose stool and 1 caplet po or 1 tsp liquid po for each subsequent stool. As needed    Historical Provider, MD  loratadine (CLARITIN) 10 MG tablet Take 1 tablet (10 mg total) by mouth daily. Patient taking   differently: Take 10 mg by mouth as needed.  08/13/13   Yvonne R Lowne, DO  naproxen (NAPROSYN) 500 MG tablet TAKE 1 TABLET BY MOUTH TWICE DAILY WITH A MEAL. as needed 12/10/12   [name], MD  omeprazole (PRILOSEC) 20 MG capsule TAKE 1 CAPSULE BY MOUTH ONCE DAILY. 07/23/12   [name], MD   Dg Hip Complete Left  04/09/2014   CLINICAL DATA:  Rollerskating injury, left hip pain  EXAM: LEFT HIP - COMPLETE 2+ VIEW  COMPARISON:  None.  FINDINGS: Intertrochanteric left hip fracture. Foreshortening with varus angulation.  Visualized bony pelvis appears intact.  IMPRESSION: Intertrochanteric left hip fracture.    Electronically Signed   By: Sriyesh  Krishnan M.D.   On: 04/09/2014 14:48   Dg Femur Left  04/09/2014   CLINICAL DATA:  33-year-old male unable to move left lower extremity secondary to pain  EXAM: LEFT FEMUR - 2 VIEW  COMPARISON:  Concurrently obtained radiographs of the pelvis  FINDINGS: Intertrochanteric left femoral fracture with varus angulation of the hip joint. The visualized bony pelvis appears intact. The femoral head remains located on the cross-table lateral view. The remainder of the femur and visualize knee are unremarkable. Normal bony mineralization. No liver blastic osseous lesion. No knee joint effusion.  IMPRESSION: Intertrochanteric left femoral fracture with varus angulation.   Electronically Signed   By: Heath  McCullough M.D.   On: 04/09/2014 14:50    Positive ROS: All other systems have been reviewed and were otherwise negative with the exception of those mentioned in the HPI and as above.  Labs cbc  Recent Labs  04/09/14 1435 04/09/14 1928  WBC 14.3* 8.9  HGB 13.6 12.8*  HCT 39.1 36.8*  PLT 270 278    Labs inflam No results for input(s): CRP in the last 72 hours.  Invalid input(s): ESR  Labs coag No results for input(s): INR, PTT in the last 72 hours.  Invalid input(s): PT   Recent Labs  04/09/14 1435  NA 138  K 4.4  CL 100  CO2 24  GLUCOSE 155*  BUN 11  CREATININE 0.70  CALCIUM 9.8    Physical Exam: Filed Vitals:   04/09/14 1944  BP: 126/86  Pulse: 87  Temp: 99.7 F (37.6 C)  Resp: 16   General: Alert, no acute distress Cardiovascular: No pedal edema Respiratory: No cyanosis, no use of accessory musculature GI: No organomegaly, abdomen is soft and non-tender Skin: No lesions in the area of chief complaint other than those listed below in MSK exam.  Neurologic: Sensation intact distally Psychiatric: Patient is competent for consent with normal mood and affect Lymphatic: No axillary or cervical lymphadenopathy  MUSCULOSKELETAL:    LLE: shortened leg Skin benign, wiggles toes, decreased sensation globally at feet 2/2 diabetic neuropathy Other extremities are atraumatic with painless ROM and NVI.  Assessment: Left intertroch hip fracture  Plan: IM nail on 11/8 Weight Bearing Status: Bedrest for now, WBAT post op PT VTE px: SCD's and hold chemical px tomorrow am.    MURPHY, TIMOTHY, D, MD Cell [phone]   04/09/2014 8:01 PM    

## 2014-04-10 NOTE — Progress Notes (Signed)
PATIENT DETAILS Name: Adam Fitzpatrick Age: 33 y.o. Sex: male Date of Birth: 10/28/1980 Admit Date: 04/09/2014 Admitting Physician Evalee Mutton Kristeen Mans, MD [id number] Birdie Riddle, MD  Subjective: No major issues-excepts  For pain in the left hip.  Assessment/Plan: Active Problems:   Intertrochanteric fracture of left TRV:UYEBXI post intramedullary nail on 11/8.await PT eval, per orthopedics, use aspirin for DVT prophylaxis. Suspect will need either home health PT or SNF on discharge.   DM type 2 (diabetes mellitus, type 2):CBGs stable, continue with SSI. Resume metformin on discharge   Hypertension: controlled,continue losartan   GERD: continue PPI   Mental retardation:currently at baseline, lives in a group home.   Disposition: Remain inpatient  Antibiotics: See Below   Anti-infectives    Start     Dose/Rate Route Frequency Ordered Stop   04/10/14 1115  ceFAZolin (ANCEF) IVPB 2 g/50 mL premix     2 g100 mL/hr over 30 Minutes Intravenous Every 6 hours 04/10/14 1107 04/10/14 2314   04/10/14 0720  vancomycin (VANCOCIN) 1 GM/200ML IVPB    Comments:  Elige Ko   : cabinet override      04/10/14 0720 04/10/14 1929      DVT Prophylaxis: Prophylactic Lovenox  Code Status: Full code   Family Communication Mother at bedside  Procedures:  None  CONSULTS:  orthopedic surgery  Time spent 40 minutes-which includes 50% of the time with face-to-face with patient/ family and coordinating care related to the above assessment and plan.  MEDICATIONS: Scheduled Meds: . [START ON 04/11/2014] aspirin EC  325 mg Oral Q breakfast  .  ceFAZolin (ANCEF) IV  2 g Intravenous Q6H  . enoxaparin (LOVENOX) injection  40 mg Subcutaneous Q24H  . insulin aspart  0-9 Units Subcutaneous TID WC  . losartan  25 mg Oral Daily  . vancomycin       Continuous Infusions:  PRN Meds:.acetaminophen **OR** acetaminophen, HYDROcodone-acetaminophen, menthol-cetylpyridinium **OR** phenol,  metoCLOPramide **OR** metoCLOPramide (REGLAN) injection, morphine injection, ondansetron **OR** ondansetron (ZOFRAN) IV    PHYSICAL EXAM: Vital signs in last 24 hours: Filed Vitals:   04/10/14 1001 04/10/14 1005 04/10/14 1108 04/10/14 1200  BP: 133/93  128/84   Pulse: 73  88   Temp:  98.9 F (37.2 C) 99.6 F (37.6 C)   TempSrc:      Resp: 14  16 12   Height:      Weight:      SpO2: 100%  99% 99%    Weight change:  Filed Weights   04/09/14 1514  Weight: 66.225 kg (146 lb)   Body mass index is 20.95 kg/(m^2).   Gen Exam: Awake and alert with clear speech.   Neck: Supple, No JVD.   Chest: B/L Clear.   CVS: S1 S2 Regular, no murmurs.  Abdomen: soft, BS +, non tender, non distended.  Extremities: no edema, lower extremities warm to touch. Neurologic: Non Focal.   Skin: No Rash.   Wounds: N/A.    Intake/Output from previous day:  Intake/Output Summary (Last 24 hours) at 04/10/14 1440 Last data filed at 04/10/14 0904  Gross per 24 hour  Intake   1600 ml  Output   1200 ml  Net    400 ml     LAB RESULTS: CBC  Recent Labs Lab 04/09/14 1435 04/09/14 1928  WBC 14.3* 8.9  HGB 13.6 12.8*  HCT 39.1 36.8*  PLT 270 278  MCV 89.1 87.2  MCH 31.0 30.3  MCHC 34.8 34.8  RDW 11.9 12.1  LYMPHSABS 0.8  --   MONOABS 1.0  --   EOSABS 0.0  --   BASOSABS 0.0  --     Chemistries   Recent Labs Lab 04/09/14 1435 04/09/14 1928  NA 138  --   K 4.4  --   CL 100  --   CO2 24  --   GLUCOSE 155*  --   BUN 11  --   CREATININE 0.70 0.66  CALCIUM 9.8  --     CBG:  Recent Labs Lab 04/09/14 1910 04/09/14 2341 04/10/14 0606 04/10/14 0919 04/10/14 1148  GLUCAP 103* 260* 150* 175* 169*    GFR Estimated Creatinine Clearance: 124.1 mL/min (by C-G formula based on Cr of 0.66).  Coagulation profile No results for input(s): INR, PROTIME in the last 168 hours.  Cardiac Enzymes No results for input(s): CKMB, TROPONINI, MYOGLOBIN in the last 168 hours.  Invalid  input(s): CK  Invalid input(s): POCBNP No results for input(s): DDIMER in the last 72 hours. No results for input(s): HGBA1C in the last 72 hours. No results for input(s): CHOL, HDL, LDLCALC, TRIG, CHOLHDL, LDLDIRECT in the last 72 hours. No results for input(s): TSH, T4TOTAL, T3FREE, THYROIDAB in the last 72 hours.  Invalid input(s): FREET3 No results for input(s): VITAMINB12, FOLATE, FERRITIN, TIBC, IRON, RETICCTPCT in the last 72 hours. No results for input(s): LIPASE, AMYLASE in the last 72 hours.  Urine Studies No results for input(s): UHGB, CRYS in the last 72 hours.  Invalid input(s): UACOL, UAPR, USPG, UPH, UTP, UGL, UKET, UBIL, UNIT, UROB, ULEU, UEPI, UWBC, URBC, UBAC, CAST, UCOM, BILUA  MICROBIOLOGY: Recent Results (from the past 240 hour(s))  MRSA PCR Screening     Status: Abnormal   Collection Time: 04/10/14  4:15 AM  Result Value Ref Range Status   MRSA by PCR POSITIVE (A) NEGATIVE Final    Comment:        The GeneXpert MRSA Assay (FDA approved for NASAL specimens only), is one component of a comprehensive MRSA colonization surveillance program. It is not intended to diagnose MRSA infection nor to guide or monitor treatment for MRSA infections. RESULT CALLED TO, READ BACK BY AND VERIFIED WITH: STEVENS,A RN [id number] 04/10/14 MITCHELL,L     RADIOLOGY STUDIES/RESULTS: Dg Hip Complete Left  04/09/2014   CLINICAL DATA:  Rollerskating injury, left hip pain  EXAM: LEFT HIP - COMPLETE 2+ VIEW  COMPARISON:  None.  FINDINGS: Intertrochanteric left hip fracture. Foreshortening with varus angulation.  Visualized bony pelvis appears intact.  IMPRESSION: Intertrochanteric left hip fracture.   Electronically Signed   By: Julian Hy M.D.   On: 04/09/2014 14:48   Dg Hip Operative Left  04/10/2014   CLINICAL DATA:  Intraoperative fixation left hip fracture  EXAM: OPERATIVE LEFT HIP  COMPARISON:  04/09/2014  FINDINGS: Three intraprocedural fluoroscopic images demonstrate left  intra medullary nail fixation of the previously seen intertrochanteric fracture. No evidence for hardware failure. Fracture fragments are in near anatomic alignment.  IMPRESSION: Intraoperative imaging as above.   Electronically Signed   By: Conchita Paris M.D.   On: 04/10/2014 09:07   Dg Femur Left  04/09/2014   CLINICAL DATA:  33 year old male unable to move left lower extremity secondary to pain  EXAM: LEFT FEMUR - 2 VIEW  COMPARISON:  Concurrently obtained radiographs of the pelvis  FINDINGS: Intertrochanteric left femoral fracture with varus angulation of the hip joint. The visualized bony pelvis appears intact. The femoral head remains located on the  cross-table lateral view. The remainder of the femur and visualize knee are unremarkable. Normal bony mineralization. No liver blastic osseous lesion. No knee joint effusion.  IMPRESSION: Intertrochanteric left femoral fracture with varus angulation.   Electronically Signed   By: Jacqulynn Cadet M.D.   On: 04/09/2014 14:50   Pelvis Portable  04/10/2014   CLINICAL DATA:  Postoperative exam after left femoral nailing  EXAM: PORTABLE PELVIS 1-2 VIEWS  COMPARISON:  04/09/2014  FINDINGS: Expected postoperative appearance after left femoral dynamic nail fixation of previously seen intertrochanteric fracture. Fracture fragments are in near anatomic alignment. Soft tissue gas is present. No evidence for hardware failure. Right femoral probable bone island incidentally noted.  IMPRESSION: Expected postoperative appearance after left femoral intra medullary nail fixation.   Electronically Signed   By: Conchita Paris M.D.   On: 04/10/2014 10:55   Chest Portable 1 View  04/09/2014   CLINICAL DATA:  Preoperative clearance for hip surgery  EXAM: PORTABLE CHEST - 1 VIEW  COMPARISON:  11/18/2009  FINDINGS: Normal mediastinum and cardiac silhouette. Normal pulmonary vasculature. No evidence of effusion, infiltrate, or pneumothorax. No acute bony abnormality.   IMPRESSION: Normal chest radiograph.   Electronically Signed   By: Suzy Bouchard M.D.   On: 04/09/2014 21:50    Oren Binet, MD  [facility] Pager:[phone]  If 7PM-7AM, please contact night-coverage [email or web address] Password [email or web address] 04/10/2014, 2:40 PM   LOS: 1 day

## 2014-04-10 NOTE — Anesthesia Preprocedure Evaluation (Addendum)
Anesthesia Evaluation  Patient identified by MRN, date of birth, ID band Patient confused    Airway Mallampati: II       Dental  (+) Dental Advidsory Given   Pulmonary asthma ,  breath sounds clear to auscultation        Cardiovascular negative cardio ROS  Rhythm:Regular Rate:Normal     Neuro/Psych PSYCHIATRIC DISORDERS    GI/Hepatic GERD-  ,  Endo/Other  diabetes  Renal/GU      Musculoskeletal   Abdominal   Peds  Hematology   Anesthesia Other Findings Mental retardation  Reproductive/Obstetrics                            Anesthesia Physical Anesthesia Plan  ASA: II  Anesthesia Plan: General   Post-op Pain Management:    Induction: Intravenous  Airway Management Planned: Oral ETT  Additional Equipment:   Intra-op Plan:   Post-operative Plan: Extubation in OR  Informed Consent: I have reviewed the patients History and Physical, chart, labs and discussed the procedure including the risks, benefits and alternatives for the proposed anesthesia with the patient or authorized representative who has indicated his/her understanding and acceptance.   Dental Advisory Given and Dental advisory given  Plan Discussed with: CRNA and Anesthesiologist  Anesthesia Plan Comments:        Anesthesia Quick Evaluation

## 2014-04-11 DIAGNOSIS — F79 Unspecified intellectual disabilities: Secondary | ICD-10-CM

## 2014-04-11 DIAGNOSIS — W19XXXS Unspecified fall, sequela: Secondary | ICD-10-CM

## 2014-04-11 DIAGNOSIS — S72142S Displaced intertrochanteric fracture of left femur, sequela: Secondary | ICD-10-CM

## 2014-04-11 LAB — BASIC METABOLIC PANEL
Anion gap: 13 (ref 5–15)
BUN: 5 mg/dL — ABNORMAL LOW (ref 6–23)
CO2: 22 meq/L (ref 19–32)
Calcium: 8.8 mg/dL (ref 8.4–10.5)
Chloride: 100 mEq/L (ref 96–112)
Creatinine, Ser: 0.56 mg/dL (ref 0.50–1.35)
GFR calc Af Amer: >90 mL/min (ref >90)
GFR calc non Af Amer: >90 mL/min (ref >90)
GLUCOSE: 297 mg/dL — AB (ref 70–99)
POTASSIUM: 3.9 meq/L (ref 3.7–5.3)
SODIUM: 135 meq/L — AB (ref 137–147)

## 2014-04-11 LAB — CBC
HCT: 33.2 % — ABNORMAL LOW (ref 39.0–52.0)
HEMOGLOBIN: 11.4 g/dL — AB (ref 13.0–17.0)
MCH: 30.3 pg (ref 26.0–34.0)
MCHC: 34.3 g/dL (ref 30.0–36.0)
MCV: 88.3 fL (ref 78.0–100.0)
Platelets: 234 10*3/uL (ref 150–400)
RBC: 3.76 MIL/uL — ABNORMAL LOW (ref 4.22–5.81)
RDW: 12.1 % (ref 11.5–15.5)
WBC: 7.8 10*3/uL (ref 4.0–10.5)

## 2014-04-11 LAB — GLUCOSE, CAPILLARY
GLUCOSE-CAPILLARY: 276 mg/dL — AB (ref 70–99)
Glucose-Capillary: 269 mg/dL — ABNORMAL HIGH (ref 70–99)
Glucose-Capillary: 257 mg/dL — ABNORMAL HIGH (ref 70–99)
Glucose-Capillary: 215 mg/dL — ABNORMAL HIGH (ref 70–99)

## 2014-04-11 MED ORDER — MUPIROCIN 2 % EX OINT
1.0000 "application " | TOPICAL_OINTMENT | Freq: Two times a day (BID) | CUTANEOUS | Status: DC
  Administered 2014-04-11 – 2014-04-12 (×3): 1 via NASAL
  Filled 2014-04-11 (×2): qty 22

## 2014-04-11 MED ORDER — DIPHENHYDRAMINE HCL 25 MG PO CAPS
25.0000 mg | ORAL_CAPSULE | Freq: Three times a day (TID) | ORAL | Status: DC | PRN
  Administered 2014-04-11: 25 mg via ORAL
  Filled 2014-04-11: qty 1

## 2014-04-11 MED ORDER — CHLORHEXIDINE GLUCONATE CLOTH 2 % EX PADS
6.0000 | MEDICATED_PAD | Freq: Every day | CUTANEOUS | Status: DC
  Administered 2014-04-12: 6 via TOPICAL

## 2014-04-11 MED ORDER — INSULIN ASPART 100 UNIT/ML ~~LOC~~ SOLN
0.0000 [IU] | Freq: Three times a day (TID) | SUBCUTANEOUS | Status: DC
  Administered 2014-04-11: 8 [IU] via SUBCUTANEOUS
  Administered 2014-04-11: 5 [IU] via SUBCUTANEOUS
  Administered 2014-04-11: 8 [IU] via SUBCUTANEOUS
  Administered 2014-04-12: 3 [IU] via SUBCUTANEOUS

## 2014-04-11 MED ORDER — CLOTRIMAZOLE 1 % EX CREA
TOPICAL_CREAM | Freq: Two times a day (BID) | CUTANEOUS | Status: DC
  Administered 2014-04-11 – 2014-04-12 (×2): via TOPICAL
  Filled 2014-04-11: qty 15

## 2014-04-11 NOTE — Clinical Social Work Psychosocial (Signed)
Clinical Social Work Department BRIEF PSYCHOSOCIAL ASSESSMENT 04/11/2014  Patient:  Adam Fitzpatrick, Adam Fitzpatrick     Account Number:  [id number]     Admit date:  04/09/2014  Clinical Social Worker:  Wylene Men  Date/Time:  04/11/2014 10:00 AM  Referred by:  Physician  Date Referred:  04/11/2014 Referred for  Psychosocial assessment   Other Referral:   none   Interview type:  Other - See comment Other interview type:   Group Home Coordinator [place] [phone]    PSYCHOSOCIAL DATA Living Status:  FACILITY Admitted from facility:  Other Level of care:  Group Home Primary support name:  Patsy Pruitt Primary support relationship to patient:  FAMILY Degree of support available:   minimal    CURRENT CONCERNS Current Concerns  Post-Acute Placement   Other Concerns:   none    SOCIAL WORK ASSESSMENT / PLAN Patient has cognitive functioning deficits hx dx of Mild Mental Retardation.  CSW spoke with Kieth Brightly at [facility] [phone] who states that pt has not beeen adjudicated incompentent and has no guardian.  Pt legally has right to make own decisions. Most decisions made at the group home are made with assistance from aunt Racheal Patches (patient's maternal aunt).  Patient has relationship with his mother though reportedly (from group home and aunt Tessie Fass), has substance abuse issues.  Per group home, when patient has visited mother, the patient returns with blood sugar readings of over 300.  Aunt Patsy and Group Home Director, Vickii Chafe do not believe patient's recovery would be successful in the mother's care given the noted history.  Adult Protective Services may need to be aware of these issues if the patient is discharged into mother's custody. CSW will continue to monitor.  Patient has not worked with PT as of yet (order placed).  Once patient works with PT, [facility] will contact aunt, Tessie Fass and review possible disposition options.  Peggy, group home director states she will also be  at the hospital this afternoon for a face-to-face.  Per group home director, patient will need to meet the qualifications of "ambulatory" (able to be independent to evacuate the facility in the case of an emergency) in order to return to the facility once dc'd from the hospital.  PT has been consulted to assist with this evaluation.  CSW will continue to asssit.  RN has been updated.   Assessment/plan status:  Psychosocial Support/Ongoing Assessment of Needs Other assessment/ plan:   FL2  [facility]  (for possible SNF placement)  Group Home Director - face-to-face evaluation  PT evaluation to assist with evaluation of ambulations  RNCM - HH PT/OT   Information/referral to community resources:   RN- Home Health PT/OT  Group Home  possible SNF/STR    PATIENT'S/FAMILY'S RESPONSE TO PLAN OF CARE: Pt is aware of possible dispositions and is agreeable to "what is best".  Group Home director aware of possible return discharge to group home.  Disposition remains unclear.       Nonnie Done, [place] [phone]  Psychiatric & Orthopedics (5N 1-16) Clinical Social Worker

## 2014-04-11 NOTE — Progress Notes (Signed)
    Subjective:  Patient reports pain as mild  Objective:   VITALS:   Filed Vitals:   04/10/14 1617 04/10/14 2003 04/11/14 0025 04/11/14 0501  BP: 117/69 124/80 130/83 123/71  Pulse: 89 105 92 111  Temp: 99.4 F (37.4 C) 100.2 F (37.9 C) 99.1 F (37.3 C) 98.6 F (37 C)  TempSrc: Oral Oral Oral Oral  Resp: 16 16 16 16   Height:      Weight:      SpO2: 99% 99% 98% 97%    Physical Exam  Dressing: C/D/I  Compartments soft  SILT M/R/U, 2+rad puls, +EPL/FPL/IO   LABS  Results for orders placed or performed during the hospital encounter of 04/09/14 (from the past 24 hour(s))  Glucose, capillary     Status: Abnormal   Collection Time: 04/10/14  9:19 AM  Result Value Ref Range   Glucose-Capillary 175 (H) 70 - 99 mg/dL   Comment 1 Notify RN    Comment 2 Documented in Chart   Glucose, capillary     Status: Abnormal   Collection Time: 04/10/14 11:48 AM  Result Value Ref Range   Glucose-Capillary 169 (H) 70 - 99 mg/dL  Glucose, capillary     Status: Abnormal   Collection Time: 04/10/14  4:23 PM  Result Value Ref Range   Glucose-Capillary 198 (H) 70 - 99 mg/dL  Glucose, capillary     Status: Abnormal   Collection Time: 04/10/14  9:59 PM  Result Value Ref Range   Glucose-Capillary 235 (H) 70 - 99 mg/dL  CBC     Status: Abnormal   Collection Time: 04/11/14  5:07 AM  Result Value Ref Range   WBC 7.8 4.0 - 10.5 K/uL   RBC 3.76 (L) 4.22 - 5.81 MIL/uL   Hemoglobin 11.4 (L) 13.0 - 17.0 g/dL   HCT 33.2 (L) 39.0 - 52.0 %   MCV 88.3 78.0 - 100.0 fL   MCH 30.3 26.0 - 34.0 pg   MCHC 34.3 30.0 - 36.0 g/dL   RDW 12.1 11.5 - 15.5 %   Platelets 234 150 - 400 K/uL  Basic metabolic panel     Status: Abnormal   Collection Time: 04/11/14  5:07 AM  Result Value Ref Range   Sodium 135 (L) 137 - 147 mEq/L   Potassium 3.9 3.7 - 5.3 mEq/L   Chloride 100 96 - 112 mEq/L   CO2 22 19 - 32 mEq/L   Glucose, Bld 297 (H) 70 - 99 mg/dL   BUN 5 (L) 6 - 23 mg/dL   Creatinine, Ser 0.56 0.50 -  1.35 mg/dL   Calcium 8.8 8.4 - 10.5 mg/dL   GFR calc non Af Amer >90 >90 mL/min   GFR calc Af Amer >90 >90 mL/min   Anion gap 13 5 - 15  Glucose, capillary     Status: Abnormal   Collection Time: 04/11/14  7:03 AM  Result Value Ref Range   Glucose-Capillary 276 (H) 70 - 99 mg/dL     Assessment/Plan: 1 Day Post-Op   Active Problems:   DM type 2 (diabetes mellitus, type 2)   Mental retardation   Intertrochanteric fracture of left hip   Hip fracture   PLAN: Weight Bearing: WBAT Dressings: Keep C/D/I VTE prophylaxis: ASA/scd's/ambulation Dispo: pending PT assessment.    Edmonia Lynch, D 04/11/2014, 8:26 AM   Edmonia Lynch, MD Cell [phone]

## 2014-04-11 NOTE — Evaluation (Signed)
Occupational Therapy Evaluation Patient Details Name: Adam Fitzpatrick MRN: [id number] DOB: 02-Jul-1980 Today's Date: 04/11/2014    History of Present Illness  33 yo male s/p Intertrochanteric fracture of left KGU:RKYHCW post intramedullary nail on 11/8   Clinical Impression   Patient is s/p IM nail L hip surgery resulting in functional limitations due to the deficits listed below (see OT problem list). PTA lived at group home independent and fall during special Olympics roller skating. Patient will benefit from skilled OT acutely to increase independence and safety with ADLS to allow discharge CIR.     Follow Up Recommendations  CIR    Equipment Recommendations  Other (comment) (defer CIR)    Recommendations for Other Services Rehab consult     Precautions / Restrictions Precautions Precautions: Fall Restrictions Weight Bearing Restrictions: Yes LLE Weight Bearing: Weight bearing as tolerated      Mobility Bed Mobility               General bed mobility comments: Recieved pt for session sitting on the EOB  Transfers Overall transfer level: Needs assistance Equipment used: Rolling walker (2 wheeled) Transfers: Sit to/from Stand Sit to Stand: Mod assist         General transfer comment: Light mod anti-gravity assist to acheive fully upright standing; Tactile cues to correct head forward posture; Cues for hand placement, safety and positioning of LLE for more comfort with sit<>stand transitions    Balance Overall balance assessment: Needs assistance         Standing balance support: Bilateral upper extremity supported;During functional activity Standing balance-Leahy Scale: Poor                              ADL Overall ADL's : Needs assistance/impaired     Grooming: Wash/dry face;Wash/dry hands;Min guard;Standing       Lower Body Bathing: Total assistance       Lower Body Dressing: Total assistance   Toilet Transfer: Moderate  assistance;Regular Toilet           Functional mobility during ADLs: Moderate assistance General ADL Comments: Pt unable to don socks and problem solve reaching socks. pt required (A) with gown. pt with mother present and verbalized during session "i just want to go home and live with mom" Pts mother states "she will stay at hospital the entire length of time patient is here"     Vision                     Perception     Praxis      Pertinent Vitals/Pain Pain Assessment: Faces Faces Pain Scale: Hurts little more Pain Location: L Hip Pain Descriptors / Indicators: Grimacing Pain Intervention(s): Monitored during session;Repositioned     Hand Dominance     Extremity/Trunk Assessment Upper Extremity Assessment Upper Extremity Assessment: Overall WFL for tasks assessed   Lower Extremity Assessment Lower Extremity Assessment: Defer to PT evaluation   Cervical / Trunk Assessment Cervical / Trunk Assessment: Normal   Communication Communication Communication: No difficulties   Cognition Arousal/Alertness: Awake/alert Behavior During Therapy: WFL for tasks assessed/performed Overall Cognitive Status: Within Functional Limits for tasks assessed (for simple mobility tasks)                     General Comments       Exercises       Shoulder Instructions      Home Living  Family/patient expects to be discharged to:: Inpatient rehab                                        Prior Functioning/Environment Level of Independence: Independent        Comments: likes to in-line skate, and goes to [employer] (which I believe is an adult daycare center)    OT Diagnosis: Generalized weakness;Cognitive deficits;Acute pain   OT Problem List: Decreased strength;Decreased activity tolerance;Impaired balance (sitting and/or standing);Decreased cognition;Decreased safety awareness;Decreased knowledge of use of DME or AE;Decreased knowledge of  precautions;Pain   OT Treatment/Interventions: Self-care/ADL training;Therapeutic exercise;DME and/or AE instruction;Therapeutic activities;Cognitive remediation/compensation;Balance training;Patient/family education    OT Goals(Current goals can be found in the care plan section) Acute Rehab OT Goals Patient Stated Goal: States he wants to go home with his mom OT Goal Formulation: With patient/family Time For Goal Achievement: 04/25/14 Potential to Achieve Goals: Good  OT Frequency: Min 2X/week   Barriers to D/C: Decreased caregiver support          Co-evaluation PT/OT/SLP Co-Evaluation/Treatment: Yes Reason for Co-Treatment: Necessary to address cognition/behavior during functional activity   OT goals addressed during session: ADL's and self-care      End of Session Equipment Utilized During Treatment: Rolling walker;Gait belt Nurse Communication: Mobility status;Precautions  Activity Tolerance: Patient tolerated treatment well Patient left: in chair;with call bell/phone within reach;with chair alarm set;with family/visitor present   Time: 8341-9622 OT Time Calculation (min): 25 min Charges:  OT General Charges $OT Visit: 1 Procedure OT Evaluation $Initial OT Evaluation Tier I: 1 Procedure G-Codes:    Adam Fitzpatrick 2014/04/14, 5:23 PM  Pager: [phone]

## 2014-04-11 NOTE — Clinical Social Work Placement (Signed)
Clinical Social Work Department CLINICAL SOCIAL WORK PLACEMENT NOTE 04/11/2014  Patient:  Adam Fitzpatrick, Adam Fitzpatrick  Account Number:  [id number] Admit date:  04/09/2014  Clinical Social Worker:  Wylene Men  Date/time:  04/11/2014 07:01 PM  Clinical Social Work is seeking post-discharge placement for this patient at the following level of care:   SKILLED NURSING   (*CSW will update this form in Epic as items are completed)   04/11/2014  Patient/family provided with [facility] Department of Clinical Social Work's list of facilities offering this level of care within the geographic area requested by the patient (or if unable, by the patient's family).  04/11/2014  Patient/family informed of their freedom to choose among providers that offer the needed level of care, that participate in Medicare, Medicaid or managed care program needed by the patient, have an available bed and are willing to accept the patient.  04/11/2014  Patient/family informed of [facility]' ownership interest in [facility], as well as of the fact that they are under no obligation to receive care at this facility.  PASARR submitted to EDS on 04/11/2014 PASARR number received on currently under review  FL2 transmitted to all facilities in geographic area requested by pt/family on  04/11/2014 FL2 transmitted to all facilities within larger geographic area on   Patient informed that his/her managed care company has contracts with or will negotiate with  certain facilities, including the following:     Patient/family informed of bed offers received:   Patient chooses bed at  Physician recommends and patient chooses bed at    Patient to be transferred to  on   Patient to be transferred to facility by  Patient and family notified of transfer on  Name of family member notified:    The following physician request were entered in Epic:   Additional Comments:   Nonnie Done, [place] [phone]  Psychiatric & Orthopedics (5N 1-16) Clinical Social Worker

## 2014-04-11 NOTE — Progress Notes (Signed)
Patient family concerned about spot on patients back that looks very similar to ring worm. Dr Sloan Leiter at bedside assessed patients skin and ordered Lotrimin two times a day.

## 2014-04-11 NOTE — Clinical Social Work Note (Signed)
CSW spoke with Aunt/main caregiver, Racheal Patches, who states she feels that patient would benefit from SNF for a couple of weeks prior to returning to the group home setting.  Aunt feels that patient being discharged with narcotics and being discharged into mother's care,  with mom's history of SA/pain meds, would not be a successful recovery environment.  RNCM aware.   CSW awaiting PT evaluation for assistance with disposition.    Nonnie Done, [place] [phone]  Psychiatric & Orthopedics (5N 1-16) Clinical Social Worker

## 2014-04-11 NOTE — Progress Notes (Signed)
PATIENT DETAILS Name: Adam Fitzpatrick Age: 33 y.o. Sex: male Date of Birth: 10-30-1980 Admit Date: 04/09/2014 Admitting Physician Evalee Mutton Kristeen Mans, MD [id number] Birdie Riddle, MD  Subjective: Mild pain in the left hip, but otherwise much better  Assessment/Plan: Active Problems:   Intertrochanteric fracture of left FGH:WEXHBZ post intramedullary nail on 11/8.Await PT eval, per orthopedic to use aspirin for DVT prophylaxis. No major issues post operatively.Suspect will need either home health PT or SNF on discharge.Will defer to PCP further work up regarding osteoporosis/Hip fracture at this age. Explained this to patient's mother-who was agreeable.   DM type 2 (diabetes mellitus, type 2):CBGs in mid 200's range, continue with SSI-but change to moderate scale. Resume metformin on discharge   Hypertension: controlled,continue losartan   GERD: continue PPI   Mental retardation:currently at baseline, lives in a group home.   Disposition: Remain inpatient  Antibiotics: See Below   Anti-infectives    Start     Dose/Rate Route Frequency Ordered Stop   04/10/14 1115  ceFAZolin (ANCEF) IVPB 2 g/50 mL premix     2 g100 mL/hr over 30 Minutes Intravenous Every 6 hours 04/10/14 1107 04/10/14 1716   04/10/14 0720  vancomycin (VANCOCIN) 1 GM/200ML IVPB    Comments:  Elige Ko   : cabinet override      04/10/14 0720 04/10/14 1929      DVT Prophylaxis: Prophylactic Lovenox  Code Status: Full code   Family Communication Mother at bedside  Procedures:  None  CONSULTS:  orthopedic surgery   MEDICATIONS: Scheduled Meds: . aspirin EC  325 mg Oral Q breakfast  . enoxaparin (LOVENOX) injection  40 mg Subcutaneous Q24H  . insulin aspart  0-9 Units Subcutaneous TID WC  . losartan  25 mg Oral Daily  . montelukast  10 mg Oral Daily  . pantoprazole  40 mg Oral Daily   Continuous Infusions:  PRN Meds:.acetaminophen **OR** acetaminophen, fluticasone,  HYDROcodone-acetaminophen, menthol-cetylpyridinium **OR** phenol, metoCLOPramide **OR** metoCLOPramide (REGLAN) injection, morphine injection, ondansetron **OR** ondansetron (ZOFRAN) IV    PHYSICAL EXAM: Vital signs in last 24 hours: Filed Vitals:   04/10/14 1617 04/10/14 2003 04/11/14 0025 04/11/14 0501  BP: 117/69 124/80 130/83 123/71  Pulse: 89 105 92 111  Temp: 99.4 F (37.4 C) 100.2 F (37.9 C) 99.1 F (37.3 C) 98.6 F (37 C)  TempSrc: Oral Oral Oral Oral  Resp: 16 16 16 16   Height:      Weight:      SpO2: 99% 99% 98% 97%    Weight change:  Filed Weights   04/09/14 1514  Weight: 66.225 kg (146 lb)   Body mass index is 20.95 kg/(m^2).   Gen Exam: Awake and alert with clear speech.   Neck: Supple, No JVD.   Chest: B/L Clear.   CVS: S1 S2 Regular, no murmurs.  Abdomen: soft, BS +, non tender, non distended.  Extremities: no edema, lower extremities warm to touch. Neurologic: Non Focal.   Skin: No Rash.   Wounds: N/A.    Intake/Output from previous day:  Intake/Output Summary (Last 24 hours) at 04/11/14 0937 Last data filed at 04/10/14 1630  Gross per 24 hour  Intake    240 ml  Output      0 ml  Net    240 ml     LAB RESULTS: CBC  Recent Labs Lab 04/09/14 1435 04/09/14 1928 04/11/14 0507  WBC 14.3* 8.9 7.8  HGB 13.6 12.8* 11.4*  HCT 39.1  36.8* 33.2*  PLT 270 278 234  MCV 89.1 87.2 88.3  MCH 31.0 30.3 30.3  MCHC 34.8 34.8 34.3  RDW 11.9 12.1 12.1  LYMPHSABS 0.8  --   --   MONOABS 1.0  --   --   EOSABS 0.0  --   --   BASOSABS 0.0  --   --     Chemistries   Recent Labs Lab 04/09/14 1435 04/09/14 1928 04/11/14 0507  NA 138  --  135*  K 4.4  --  3.9  CL 100  --  100  CO2 24  --  22  GLUCOSE 155*  --  297*  BUN 11  --  5*  CREATININE 0.70 0.66 0.56  CALCIUM 9.8  --  8.8    CBG:  Recent Labs Lab 04/10/14 0919 04/10/14 1148 04/10/14 1623 04/10/14 2159 04/11/14 0703  GLUCAP 175* 169* 198* 235* 276*    GFR Estimated  Creatinine Clearance: 124.1 mL/min (by C-G formula based on Cr of 0.56).  Coagulation profile No results for input(s): INR, PROTIME in the last 168 hours.  Cardiac Enzymes No results for input(s): CKMB, TROPONINI, MYOGLOBIN in the last 168 hours.  Invalid input(s): CK  Invalid input(s): POCBNP No results for input(s): DDIMER in the last 72 hours. No results for input(s): HGBA1C in the last 72 hours. No results for input(s): CHOL, HDL, LDLCALC, TRIG, CHOLHDL, LDLDIRECT in the last 72 hours. No results for input(s): TSH, T4TOTAL, T3FREE, THYROIDAB in the last 72 hours.  Invalid input(s): FREET3 No results for input(s): VITAMINB12, FOLATE, FERRITIN, TIBC, IRON, RETICCTPCT in the last 72 hours. No results for input(s): LIPASE, AMYLASE in the last 72 hours.  Urine Studies No results for input(s): UHGB, CRYS in the last 72 hours.  Invalid input(s): UACOL, UAPR, USPG, UPH, UTP, UGL, UKET, UBIL, UNIT, UROB, ULEU, UEPI, UWBC, URBC, UBAC, CAST, UCOM, BILUA  MICROBIOLOGY: Recent Results (from the past 240 hour(s))  MRSA PCR Screening     Status: Abnormal   Collection Time: 04/10/14  4:15 AM  Result Value Ref Range Status   MRSA by PCR POSITIVE (A) NEGATIVE Final    Comment:        The GeneXpert MRSA Assay (FDA approved for NASAL specimens only), is one component of a comprehensive MRSA colonization surveillance program. It is not intended to diagnose MRSA infection nor to guide or monitor treatment for MRSA infections. RESULT CALLED TO, READ BACK BY AND VERIFIED WITH: STEVENS,A RN 1324 04/10/14 MITCHELL,L     RADIOLOGY STUDIES/RESULTS: Dg Hip Complete Left  04/09/2014   CLINICAL DATA:  Rollerskating injury, left hip pain  EXAM: LEFT HIP - COMPLETE 2+ VIEW  COMPARISON:  None.  FINDINGS: Intertrochanteric left hip fracture. Foreshortening with varus angulation.  Visualized bony pelvis appears intact.  IMPRESSION: Intertrochanteric left hip fracture.   Electronically Signed   By:  Julian Hy M.D.   On: 04/09/2014 14:48   Dg Hip Operative Left  04/10/2014   CLINICAL DATA:  Intraoperative fixation left hip fracture  EXAM: OPERATIVE LEFT HIP  COMPARISON:  04/09/2014  FINDINGS: Three intraprocedural fluoroscopic images demonstrate left intra medullary nail fixation of the previously seen intertrochanteric fracture. No evidence for hardware failure. Fracture fragments are in near anatomic alignment.  IMPRESSION: Intraoperative imaging as above.   Electronically Signed   By: Conchita Paris M.D.   On: 04/10/2014 09:07   Dg Femur Left  04/09/2014   CLINICAL DATA:  33 year old male unable to move left lower  extremity secondary to pain  EXAM: LEFT FEMUR - 2 VIEW  COMPARISON:  Concurrently obtained radiographs of the pelvis  FINDINGS: Intertrochanteric left femoral fracture with varus angulation of the hip joint. The visualized bony pelvis appears intact. The femoral head remains located on the cross-table lateral view. The remainder of the femur and visualize knee are unremarkable. Normal bony mineralization. No liver blastic osseous lesion. No knee joint effusion.  IMPRESSION: Intertrochanteric left femoral fracture with varus angulation.   Electronically Signed   By: Jacqulynn Cadet M.D.   On: 04/09/2014 14:50   Pelvis Portable  04/10/2014   CLINICAL DATA:  Postoperative exam after left femoral nailing  EXAM: PORTABLE PELVIS 1-2 VIEWS  COMPARISON:  04/09/2014  FINDINGS: Expected postoperative appearance after left femoral dynamic nail fixation of previously seen intertrochanteric fracture. Fracture fragments are in near anatomic alignment. Soft tissue gas is present. No evidence for hardware failure. Right femoral probable bone island incidentally noted.  IMPRESSION: Expected postoperative appearance after left femoral intra medullary nail fixation.   Electronically Signed   By: Conchita Paris M.D.   On: 04/10/2014 10:55   Chest Portable 1 View  04/09/2014   CLINICAL DATA:   Preoperative clearance for hip surgery  EXAM: PORTABLE CHEST - 1 VIEW  COMPARISON:  11/18/2009  FINDINGS: Normal mediastinum and cardiac silhouette. Normal pulmonary vasculature. No evidence of effusion, infiltrate, or pneumothorax. No acute bony abnormality.  IMPRESSION: Normal chest radiograph.   Electronically Signed   By: Suzy Bouchard M.D.   On: 04/09/2014 21:50    Oren Binet, MD  [facility] Pager:[phone]  If 7PM-7AM, please contact night-coverage [email or web address] Password [email or web address] 04/11/2014, 9:37 AM   LOS: 2 days

## 2014-04-11 NOTE — Progress Notes (Signed)
Rehab Admissions Coordinator Note:  Patient was screened by Retta Diones for appropriateness for an Inpatient Acute Rehab Consult.  At this time, we are recommending Inpatient Rehab consult.  Retta Diones 04/11/2014, 2:38 PM  I can be reached at [phone].

## 2014-04-11 NOTE — Consult Note (Signed)
Physical Medicine and Rehabilitation Consult Reason for Consult:Left Intertrochanteric hip fracture Referring Physician: [facility]   HPI: Adam Fitzpatrick is a 33 y.o.right handed male with history of mental retardation, diabetes mellitus neuropathy and asthma. Independent prior to admission living in a group home.Admitted 04/09/2014 after a fall while skating at [place]. There was no loss of consciousness. X-rays and imaging revealed a left intertrochanteric hip fracture. Underwent intramedullary nailing 04/10/2014 per Dr. Edmonia Lynch. Hospital course pain management. Weightbearing as tolerated left lower extremity.  Subcutaneous Lovenox for DVT prophylaxis. Contact precautions for positive  MRSA nasal naris. Physical therapy evaluation completed 04/11/2014 with recommendations of physical medicine rehabilitation consult.   Review of Systems  Gastrointestinal:       GERD  All other systems reviewed and are negative.  Past Medical History  Diagnosis Date  . MR (mental retardation), moderate   . Diabetes mellitus   . Asthma   . GERD (gastroesophageal reflux disease)   . Hyperlipidemia     notes only hx of hyperlipidemia  . Blood in urine    History reviewed. No pertinent past surgical history. Family History  Problem Relation Age of Onset  . Alcohol abuse Father   . Cancer Maternal Grandmother   . Hyperlipidemia Maternal Grandmother   . Heart disease Maternal Grandmother   . Hypertension Maternal Grandmother   . Heart disease Maternal Grandfather   . Hypertension Maternal Grandfather   . Hyperlipidemia Maternal Grandfather   . Hyperlipidemia Paternal Grandmother   . Heart disease Paternal Grandmother   . Hypertension Paternal Grandmother   . Kidney disease Paternal Grandmother   . Diabetes Paternal Grandmother   . Heart disease Paternal Grandfather   . Hypertension Paternal Grandfather   . Hyperlipidemia Paternal Grandfather    Social History:  reports that  he has never smoked. He does not have any smokeless tobacco history on file. He reports that he does not drink alcohol or use illicit drugs. Allergies:  Allergies  Allergen Reactions  . Shellfish Allergy Anaphylaxis  . Prednisone Itching   Medications Prior to Admission  Medication Sig Dispense Refill  . acetaminophen (TYLENOL) 500 MG tablet Take 500 mg by mouth every 4 (four) hours as needed (pain; fever >100).     Marland Kitchen albuterol (PROVENTIL HFA;VENTOLIN HFA) 108 (90 BASE) MCG/ACT inhaler Inhale 2 puffs into the lungs 4 (four) times daily as needed for wheezing or shortness of breath. Shortness of breath/wheezing    . clotrimazole (LOTRIMIN) 1 % cream Apply 1 application topically 2 (two) times daily as needed (rash; jock itch).    . diphenhydrAMINE (BENADRYL) 25 MG tablet Take 25-50 mg by mouth every 6 (six) hours as needed (allergic reaction). Take 2 tablets (50 mg) for ingestion of shellfish and seek emergency help.    . fluticasone (FLONASE) 50 MCG/ACT nasal spray Place 1 spray into both nostrils daily as needed for allergies or rhinitis.     Marland Kitchen guaiFENesin-dextromethorphan (ROBITUSSIN DM) 100-10 MG/5ML syrup Take 15 mLs by mouth every 4 (four) hours as needed for cough (cold symptoms). Not to exceed one week without notifying physician    . ibuprofen (ADVIL,MOTRIN) 200 MG tablet Take 200-400 mg by mouth every 4 (four) hours as needed (pain).    Marland Kitchen loperamide (IMODIUM A-D) 2 MG tablet Take 2 mg by mouth as needed for diarrhea or loose stools. Take 2 caplets  or 4 tsp (20 mls) liquid by mouth after first loose stool and 1 caplet or 1 tsp (  5 mls) liquid by mouth for each subsequent stool as needed; not to exceed 8 caplets or 50 mls in 24 hours    . loratadine (CLARITIN) 10 MG tablet Take 1 tablet (10 mg total) by mouth daily. (Patient taking differently: Take 10 mg by mouth as needed for allergies (sneezing, runny nose, coughing and watery, itchy eyes). ) 30 tablet 11  . losartan (COZAAR) 25 MG tablet  Take 25 mg by mouth daily.     . metFORMIN (GLUCOPHAGE) 1000 MG tablet TAKE 1 TABLET BY MOUTH TWICE DAILY WITH A MEAL. 60 tablet 6  . montelukast (SINGULAIR) 10 MG tablet Take 10 mg by mouth daily.     . naproxen sodium (ALEVE) 220 MG tablet Take 220 mg by mouth every 8 (eight) hours as needed (pain).    Marland Kitchen omeprazole (PRILOSEC) 20 MG capsule TAKE 1 CAPSULE BY MOUTH ONCE DAILY. 31 capsule 5  . naproxen (NAPROSYN) 500 MG tablet TAKE 1 TABLET BY MOUTH TWICE DAILY WITH A MEAL. as needed      Home: Home Living Family/patient expects to be discharged to:: Inpatient rehab  Functional History: Prior Function Level of Independence: Independent Comments: likes to in-line skate, and goes to [employer] (which I believe is an adult daycare center) Functional Status:  Mobility: Bed Mobility General bed mobility comments: Recieved pt for session sitting on the EOB Transfers Overall transfer level: Needs assistance Equipment used: Rolling walker (2 wheeled) Transfers: Sit to/from Stand Sit to Stand: Mod assist General transfer comment: Light mod anti-gravity assist to acheive fully upright standing; Tactile cues to correct head forward posture; Cues for hand placement, safety and positioning of LLE for more comfort with sit<>stand transitions Ambulation/Gait Ambulation/Gait assistance: Min assist Ambulation Distance (Feet): 30 Feet Assistive device: Rolling walker (2 wheeled) Gait Pattern/deviations: Step-to pattern, Decreased step length - left, Decreased stance time - left Gait velocity: slowed General Gait Details: Cues for gait sequence, and to bear weight into RW with bil UEs to unweigh painful LLE in stance    ADL:    Cognition: Cognition Overall Cognitive Status: Within Functional Limits for tasks assessed (for simple mobility tasks) Orientation Level: Oriented X4 Cognition Arousal/Alertness: Awake/alert Behavior During Therapy: WFL for tasks assessed/performed Overall Cognitive  Status: Within Functional Limits for tasks assessed (for simple mobility tasks)  Blood pressure 127/81, pulse 99, temperature 98.6 F (37 C), temperature source Oral, resp. rate 18, height 5\' 10"  (1.778 m), weight 66.225 kg (146 lb), SpO2 100 %. Physical Exam  Constitutional: He appears well-developed.  HENT:  Head: Normocephalic.  Eyes: EOM are normal.  Neck: Normal range of motion. Neck supple. No thyromegaly present.  Cardiovascular: Normal rate and regular rhythm.   Respiratory: Effort normal and breath sounds normal. No respiratory distress.  GI: Soft. Bowel sounds are normal. He exhibits no distension.  Musculoskeletal:  Left leg inhibited by pain.   Neurological: He is alert.  Patient is pleasant and makes good eye contact with examiner. He was oriented to person place and date of birth. He follows commands. Unable to lift left leg off bed. ADF and APF 4/5 LLE. RLE: 3/5 hf, 4/5 ke and 5/5 adf/apf. UE's 5/5 bilaterally prox to distal.   Skin:  Hip incision clean and dry with dressing in place appropriately tender  Psychiatric:  Reasonable attention and focus. Basic awareness and insight.    Results for orders placed or performed during the hospital encounter of 04/09/14 (from the past 24 hour(s))  Glucose, capillary  Status: Abnormal   Collection Time: 04/10/14  4:23 PM  Result Value Ref Range   Glucose-Capillary 198 (H) 70 - 99 mg/dL  Glucose, capillary     Status: Abnormal   Collection Time: 04/10/14  9:59 PM  Result Value Ref Range   Glucose-Capillary 235 (H) 70 - 99 mg/dL  CBC     Status: Abnormal   Collection Time: 04/11/14  5:07 AM  Result Value Ref Range   WBC 7.8 4.0 - 10.5 K/uL   RBC 3.76 (L) 4.22 - 5.81 MIL/uL   Hemoglobin 11.4 (L) 13.0 - 17.0 g/dL   HCT 33.2 (L) 39.0 - 52.0 %   MCV 88.3 78.0 - 100.0 fL   MCH 30.3 26.0 - 34.0 pg   MCHC 34.3 30.0 - 36.0 g/dL   RDW 12.1 11.5 - 15.5 %   Platelets 234 150 - 400 K/uL  Basic metabolic panel     Status:  Abnormal   Collection Time: 04/11/14  5:07 AM  Result Value Ref Range   Sodium 135 (L) 137 - 147 mEq/L   Potassium 3.9 3.7 - 5.3 mEq/L   Chloride 100 96 - 112 mEq/L   CO2 22 19 - 32 mEq/L   Glucose, Bld 297 (H) 70 - 99 mg/dL   BUN 5 (L) 6 - 23 mg/dL   Creatinine, Ser 0.56 0.50 - 1.35 mg/dL   Calcium 8.8 8.4 - 10.5 mg/dL   GFR calc non Af Amer >90 >90 mL/min   GFR calc Af Amer >90 >90 mL/min   Anion gap 13 5 - 15  Glucose, capillary     Status: Abnormal   Collection Time: 04/11/14  7:03 AM  Result Value Ref Range   Glucose-Capillary 276 (H) 70 - 99 mg/dL  Glucose, capillary     Status: Abnormal   Collection Time: 04/11/14 12:24 PM  Result Value Ref Range   Glucose-Capillary 269 (H) 70 - 99 mg/dL   Dg Hip Operative Left  04/10/2014   CLINICAL DATA:  Intraoperative fixation left hip fracture  EXAM: OPERATIVE LEFT HIP  COMPARISON:  04/09/2014  FINDINGS: Three intraprocedural fluoroscopic images demonstrate left intra medullary nail fixation of the previously seen intertrochanteric fracture. No evidence for hardware failure. Fracture fragments are in near anatomic alignment.  IMPRESSION: Intraoperative imaging as above.   Electronically Signed   By: Conchita Paris M.D.   On: 04/10/2014 09:07   Pelvis Portable  04/10/2014   CLINICAL DATA:  Postoperative exam after left femoral nailing  EXAM: PORTABLE PELVIS 1-2 VIEWS  COMPARISON:  04/09/2014  FINDINGS: Expected postoperative appearance after left femoral dynamic nail fixation of previously seen intertrochanteric fracture. Fracture fragments are in near anatomic alignment. Soft tissue gas is present. No evidence for hardware failure. Right femoral probable bone island incidentally noted.  IMPRESSION: Expected postoperative appearance after left femoral intra medullary nail fixation.   Electronically Signed   By: Conchita Paris M.D.   On: 04/10/2014 10:55   Chest Portable 1 View  04/09/2014   CLINICAL DATA:  Preoperative clearance for hip  surgery  EXAM: PORTABLE CHEST - 1 VIEW  COMPARISON:  11/18/2009  FINDINGS: Normal mediastinum and cardiac silhouette. Normal pulmonary vasculature. No evidence of effusion, infiltrate, or pneumothorax. No acute bony abnormality.  IMPRESSION: Normal chest radiograph.   Electronically Signed   By: Suzy Bouchard M.D.   On: 04/09/2014 21:50    Assessment/Plan: Diagnosis: left intertrochanteric hip fx after fall 1. Does the need for close, 24 hr/day medical supervision in  concert with the patient's rehab needs make it unreasonable for this patient to be served in a less intensive setting? Yes 2. Co-Morbidities requiring supervision/potential complications: dm2, gerd, MR 3. Due to bladder management, bowel management, safety, skin/wound care, disease management, medication administration, pain management and patient education, does the patient require 24 hr/day rehab nursing? Yes 4. Does the patient require coordinated care of a physician, rehab nurse, PT (1-2 hrs/day, 5 days/week) and OT (1-2 hrs/day, 5 days/week) to address physical and functional deficits in the context of the above medical diagnosis(es)? Yes Addressing deficits in the following areas: balance, endurance, locomotion, strength, transferring, bowel/bladder control, bathing, dressing, feeding, grooming, toileting and psychosocial support 5. Can the patient actively participate in an intensive therapy program of at least 3 hrs of therapy per day at least 5 days per week? Yes 6. The potential for patient to make measurable gains while on inpatient rehab is excellent 7. Anticipated functional outcomes upon discharge from inpatient rehab are modified independent  with PT, modified independent with OT, n/a with SLP. 8. Estimated rehab length of stay to reach the above functional goals is: 7-9 days 9. Does the patient have adequate social supports to accommodate these discharge functional goals? Yes 10. Anticipated D/C setting:  Home 11. Anticipated post D/C treatments: [place] therapy 12. Overall Rehab/Functional Prognosis: excellent  RECOMMENDATIONS: This patient's condition is appropriate for continued rehabilitative care in the following setting: CIR Patient has agreed to participate in recommended program. Yes Note that insurance prior authorization may be required for reimbursement for recommended care.  Comment: Pt is motivated. Has supportive mother. Rehab Admissions Coordinator to follow up.  Thanks,  Meredith Staggers, MD, Mellody Drown     04/11/2014

## 2014-04-11 NOTE — Evaluation (Signed)
Physical Therapy Evaluation Patient Details Name: Adam Fitzpatrick MRN: [id number] DOB: 02-11-1981 Today's Date: 04/11/2014   History of Present Illness  Intertrochanteric fracture of left ZGY:[id number] post intramedullary nail on 11/8  Past Medical History  Diagnosis Date  . MR (mental retardation), moderate   . Diabetes mellitus   . Asthma   . GERD (gastroesophageal reflux disease)   . Hyperlipidemia     notes only hx of hyperlipidemia  . Blood in urine      Clinical Impression  Patient is s/p above surgery resulting in functional limitations due to the deficits listed below (see PT Problem List).  Patient will benefit from skilled PT to increase their independence and safety with mobility to allow discharge to the venue listed below.       Follow Up Recommendations CIR    Equipment Recommendations  Rolling walker with 5" wheels;3in1 (PT)    Recommendations for Other Services Rehab consult     Precautions / Restrictions Precautions Precautions: Fall Restrictions LLE Weight Bearing: Weight bearing as tolerated      Mobility  Bed Mobility               General bed mobility comments: Recieved pt for session sitting on the EOB  Transfers Overall transfer level: Needs assistance Equipment used: Rolling walker (2 wheeled) Transfers: Sit to/from Stand Sit to Stand: Mod assist         General transfer comment: Light mod anti-gravity assist to acheive fully upright standing; Tactile cues to correct head forward posture; Cues for hand placement, safety and positioning of LLE for more comfort with sit<>stand transitions  Ambulation/Gait Ambulation/Gait assistance: Min assist Ambulation Distance (Feet): 30 Feet Assistive device: Rolling walker (2 wheeled) Gait Pattern/deviations: Step-to pattern;Decreased step length - left;Decreased stance time - left Gait velocity: slowed   General Gait Details: Cues for gait sequence, and to bear weight into RW with bil UEs to  unweigh painful LLE in stance  Stairs            Wheelchair Mobility    Modified Rankin (Stroke Patients Only)       Balance Overall balance assessment: Needs assistance         Standing balance support: Bilateral upper extremity supported Standing balance-Leahy Scale: Poor                               Pertinent Vitals/Pain Pain Assessment: Faces Faces Pain Scale: Hurts little more Pain Location: L hip with movement and WB Pain Descriptors / Indicators: Grimacing Pain Intervention(s): Monitored during session;Repositioned    Home Living Family/patient expects to be discharged to:: Inpatient rehab                      Prior Function Level of Independence: Independent         Comments: likes to in-line skate, and goes to [employer] (which I believe is an adult daycare center)     Hand Dominance        Extremity/Trunk Assessment   Upper Extremity Assessment: Defer to OT evaluation           Lower Extremity Assessment: LLE deficits/detail   LLE Deficits / Details: grossly decr AROM and strength, limited by pain postop     Communication   Communication: No difficulties  Cognition Arousal/Alertness: Awake/alert Behavior During Therapy: WFL for tasks assessed/performed Overall Cognitive Status: Within Functional Limits for tasks assessed (for simple mobility  tasks)                      General Comments      Exercises        Assessment/Plan    PT Assessment Patient needs continued PT services  PT Diagnosis Difficulty walking;Acute pain   PT Problem List Decreased strength;Decreased range of motion;Decreased activity tolerance;Decreased balance;Decreased mobility;Decreased coordination;Decreased knowledge of use of DME;Pain;Decreased knowledge of precautions  PT Treatment Interventions DME instruction;Gait training;Stair training;Functional mobility training;Therapeutic activities;Therapeutic  exercise;Patient/family education   PT Goals (Current goals can be found in the Care Plan section) Acute Rehab PT Goals Patient Stated Goal: States he wants to go home with his mom PT Goal Formulation: With patient Time For Goal Achievement: 04/25/14 Potential to Achieve Goals: Good    Frequency Min 5X/week   Barriers to discharge        Co-evaluation PT/OT/SLP Co-Evaluation/Treatment: Yes Reason for Co-Treatment: Other (comment);Necessary to address cognition/behavior during functional activity (during assessment of pt's activity tolerance) PT goals addressed during session: Mobility/safety with mobility         End of Session Equipment Utilized During Treatment: Gait belt Activity Tolerance: Patient tolerated treatment well Patient left: in chair;with call bell/phone within reach;with family/visitor present Nurse Communication: Mobility status         Time: 8338-2505 PT Time Calculation (min): 26 min   Charges:   PT Evaluation $Initial PT Evaluation Tier I: 1 Procedure PT Treatments $Gait Training: 8-22 mins   PT G Codes:          Roney Marion Hamff 04/11/2014, 1:20 PM  Roney Marion, [place] Pager [phone] Office [phone]

## 2014-04-11 NOTE — Care Management Note (Signed)
CARE MANAGEMENT NOTE 04/11/2014  Patient:  Adam Fitzpatrick, Adam Fitzpatrick   Account Number:  [id number]  Date Initiated:  04/11/2014  Documentation initiated by:  Ricki Miller  Subjective/Objective Assessment:   33 yr old male admitted s/p fall with a left hip fracture. Patient had a left hip IM Nailing.  Patient lives at a group home.     Action/Plan:   CM spoke with Social worker concerning patient's need at discharge. Patient will need shortterm rehab at SNF prior to returning to group home. Social worker has spoken to patient's aunt Patsy and to Group home.   Anticipated DC Date:  04/12/2014   Anticipated DC Plan:  SKILLED NURSING FACILITY  In-house referral  Clinical Social Worker      DC Planning Services  CM consult      [facility] Choice  NA   Choice offered to / List presented to:     DME arranged  NA        [place] arranged  NA      Status of service:  Completed, signed off Medicare Important Message given?   (If response is "NO", the following Medicare IM given date fields will be blank) Date Medicare IM given:   Medicare IM given by:   Date Additional Medicare IM given:   Additional Medicare IM given by:    Discharge Disposition:  [place]  Per UR Regulation:  Reviewed for med. necessity/level of care/duration of stay

## 2014-04-12 ENCOUNTER — Encounter (HOSPITAL_COMMUNITY): Payer: Self-pay | Admitting: General Practice

## 2014-04-12 DIAGNOSIS — S72142S Displaced intertrochanteric fracture of left femur, sequela: Secondary | ICD-10-CM | POA: Diagnosis not present

## 2014-04-12 DIAGNOSIS — J4520 Mild intermittent asthma, uncomplicated: Secondary | ICD-10-CM | POA: Diagnosis not present

## 2014-04-12 DIAGNOSIS — D62 Acute posthemorrhagic anemia: Secondary | ICD-10-CM | POA: Diagnosis not present

## 2014-04-12 DIAGNOSIS — R21 Rash and other nonspecific skin eruption: Secondary | ICD-10-CM | POA: Diagnosis not present

## 2014-04-12 DIAGNOSIS — F79 Unspecified intellectual disabilities: Secondary | ICD-10-CM | POA: Diagnosis not present

## 2014-04-12 DIAGNOSIS — I10 Essential (primary) hypertension: Secondary | ICD-10-CM | POA: Diagnosis not present

## 2014-04-12 DIAGNOSIS — R278 Other lack of coordination: Secondary | ICD-10-CM | POA: Diagnosis not present

## 2014-04-12 DIAGNOSIS — E119 Type 2 diabetes mellitus without complications: Secondary | ICD-10-CM | POA: Diagnosis not present

## 2014-04-12 DIAGNOSIS — K5900 Constipation, unspecified: Secondary | ICD-10-CM | POA: Diagnosis not present

## 2014-04-12 DIAGNOSIS — Z9181 History of falling: Secondary | ICD-10-CM | POA: Diagnosis not present

## 2014-04-12 DIAGNOSIS — S72142D Displaced intertrochanteric fracture of left femur, subsequent encounter for closed fracture with routine healing: Secondary | ICD-10-CM | POA: Diagnosis not present

## 2014-04-12 DIAGNOSIS — R531 Weakness: Secondary | ICD-10-CM | POA: Diagnosis not present

## 2014-04-12 DIAGNOSIS — E11349 Type 2 diabetes mellitus with severe nonproliferative diabetic retinopathy without macular edema: Secondary | ICD-10-CM

## 2014-04-12 DIAGNOSIS — M21252 Flexion deformity, left hip: Secondary | ICD-10-CM | POA: Diagnosis not present

## 2014-04-12 DIAGNOSIS — R2681 Unsteadiness on feet: Secondary | ICD-10-CM | POA: Diagnosis not present

## 2014-04-12 DIAGNOSIS — K219 Gastro-esophageal reflux disease without esophagitis: Secondary | ICD-10-CM | POA: Diagnosis not present

## 2014-04-12 DIAGNOSIS — M25552 Pain in left hip: Secondary | ICD-10-CM | POA: Diagnosis not present

## 2014-04-12 DIAGNOSIS — S72002S Fracture of unspecified part of neck of left femur, sequela: Secondary | ICD-10-CM | POA: Diagnosis not present

## 2014-04-12 LAB — GLUCOSE, CAPILLARY
GLUCOSE-CAPILLARY: 270 mg/dL — AB (ref 70–99)
Glucose-Capillary: 174 mg/dL — ABNORMAL HIGH (ref 70–99)

## 2014-04-12 LAB — CBC
HCT: 30.2 % — ABNORMAL LOW (ref 39.0–52.0)
Hemoglobin: 10.3 g/dL — ABNORMAL LOW (ref 13.0–17.0)
MCH: 30.1 pg (ref 26.0–34.0)
MCHC: 34.1 g/dL (ref 30.0–36.0)
MCV: 88.3 fL (ref 78.0–100.0)
PLATELETS: 205 10*3/uL (ref 150–400)
RBC: 3.42 MIL/uL — AB (ref 4.22–5.81)
RDW: 12.1 % (ref 11.5–15.5)
WBC: 5.6 10*3/uL (ref 4.0–10.5)

## 2014-04-12 MED ORDER — HYDROCODONE-ACETAMINOPHEN 5-325 MG PO TABS: 1.0000 | ORAL_TABLET | Freq: Four times a day (QID) | ORAL | Status: DC | PRN

## 2014-04-12 NOTE — Progress Notes (Signed)
On 04/10/14 at 1139 2mg /24mL of morphine was removed on patient. Patient has an order for 0.5mg  of morphine. Morphine (2mg /63mL) waste was mistaken for Dilaudid (1mg /46mL) waste and only 0.5mg  of morphine was wasted  with Durene Cal, RN in pyxis. Only 0.5mg  of morphine was scanned and administered to patient at 1143 on 04/10/14 (documentation on pts MAR). Today, 04/12/14 pyxis showed undocumented waste. Notified Sadie with pharmacy about incident and was told to make note in chart about occurrence. Wasted remaining amount (1mg ) of morphine today with Carron Brazen, RN in pyxis to fix undocumented waste.

## 2014-04-12 NOTE — Progress Notes (Signed)
Patient ID: IMAD SHOSTAK, male   DOB: Nov 09, 1980, 33 y.o.   MRN: [id number]     Subjective:  Patient reports pain as mild.  Patient and mother report great improvement.  Denies any SP or SOB  Objective:   VITALS:   Filed Vitals:   04/11/14 0947 04/11/14 1226 04/11/14 2117 04/12/14 0646  BP: 107/78 127/81 113/73 122/78  Pulse:  99 95 98  Temp:   98.5 F (36.9 C) 98.4 F (36.9 C)  TempSrc:      Resp:  18 18 18   Height:      Weight:      SpO2:  100% 99% 99%    ABD soft Sensation intact distally Dorsiflexion/Plantar flexion intact Incision: dressing C/D/I and no drainage   Lab Results  Component Value Date   WBC 5.6 04/12/2014   HGB 10.3* 04/12/2014   HCT 30.2* 04/12/2014   MCV 88.3 04/12/2014   PLT 205 04/12/2014     Assessment/Plan: 2 Days Post-Op   Active Problems:   DM type 2 (diabetes mellitus, type 2)   Mental retardation   Intertrochanteric fracture of left hip   Hip fracture   Advance diet Up with therapy  Continue plan per medicine WBAT Dry dressing PRN    Remonia Richter 04/12/2014, 10:33 AM   Edmonia Lynch MD [phone]

## 2014-04-12 NOTE — Clinical Social Work Note (Signed)
Patient will discharge today to [facility] RN to call report to: [phone] Transportation: [place] and patient aware of discharge plans and agreeable.  Nonnie Done, [place] [phone]  Psychiatric & Orthopedics (5N 1-16) Clinical Social Worker

## 2014-04-12 NOTE — Progress Notes (Signed)
Physical Therapy Treatment Patient Details Name: Adam Fitzpatrick MRN: [id number] DOB: September 16, 1980 Today's Date: 04/12/2014    History of Present Illness Intertrochanteric fracture of left CBJ:SEGBTD post intramedullary nail on 11/8    PT Comments    Continuing progress with mobility; Noted plans for dc to SNF for rehab; Anticipate good progress  Follow Up Recommendations  CIR     Equipment Recommendations  Rolling walker with 5" wheels;3in1 (PT)    Recommendations for Other Services       Precautions / Restrictions Precautions Precautions: Fall Restrictions LLE Weight Bearing: Weight bearing as tolerated    Mobility  Bed Mobility Overal bed mobility: Needs Assistance Bed Mobility: Supine to Sit     Supine to sit: Min guard     General bed mobility comments: Cues for technique  Transfers Overall transfer level: Needs assistance Equipment used: Rolling walker (2 wheeled) Transfers: Sit to/from Stand Sit to Stand: Min assist         General transfer comment: min assist mostly for steadiness  Ambulation/Gait Ambulation/Gait assistance: Min guard Ambulation Distance (Feet): 40 Feet Assistive device: Rolling walker (2 wheeled) Gait Pattern/deviations: Step-to pattern;Trunk flexed Gait velocity: slowed   General Gait Details: Cues for gait sequence, and to bear weight into RW with bil UEs to unweigh painful LLE in stance   Stairs            Wheelchair Mobility    Modified Rankin (Stroke Patients Only)       Balance                                    Cognition Arousal/Alertness: Awake/alert Behavior During Therapy: WFL for tasks assessed/performed Overall Cognitive Status: Within Functional Limits for tasks assessed                      Exercises Total Joint Exercises Ankle Circles/Pumps: AROM;Both;10 reps Quad Sets: AROM;Left;10 reps Gluteal Sets: AROM;Both;10 reps Towel Squeeze: AROM;Both;10 reps Heel Slides:  AAROM;Left;10 reps Hip ABduction/ADduction: AROM;Left;10 reps    General Comments        Pertinent Vitals/Pain Pain Assessment: Faces Faces Pain Scale: Hurts little more Pain Location: L hip with movement/therex in bed Pain Descriptors / Indicators: Grimacing Pain Intervention(s): Monitored during session;Repositioned    Home Living                      Prior Function            PT Goals (current goals can now be found in the care plan section) Acute Rehab PT Goals Patient Stated Goal: Hopeful for rehab PT Goal Formulation: With patient Time For Goal Achievement: 04/25/14 Potential to Achieve Goals: Good Progress towards PT goals: Progressing toward goals    Frequency  Min 5X/week    PT Plan Current plan remains appropriate    Co-evaluation             End of Session Equipment Utilized During Treatment: Gait belt Activity Tolerance: Patient tolerated treatment well Patient left: in chair;with call bell/phone within reach;with family/visitor present     Time: 1761-6073 PT Time Calculation (min) (ACUTE ONLY): 30 min  Charges:  $Gait Training: 8-22 mins $Therapeutic Exercise: 8-22 mins                    G Codes:      Roney Marion Hamff 04/12/2014, 4:19 PM  Roney Marion, [place]  Acute Rehabilitation Services Pager [phone] Office [phone]

## 2014-04-12 NOTE — Clinical Social Work Note (Addendum)
2:06pm- PASARR received.  SNF notified.  Aunt Patsy notified of transfer  11:38am- CSW spoke with [facility] who is able to offer a bed to patient.  [facility] requests to contact [facility] to inquire of behaviors or other areas of need for the patient.  Aunt is aware and has given verbal permission for this call.  [facility] will also contact Patsy regarding admission paperwork completion.    10:41am- CSW submitted clinicals for PASARR review.  PASARR pending.  Bed choices have been received by family: 1) [facility] 2) [facility]  CSW to continue to follow and assist with disposition/discharge.  Nonnie Done, [place] [phone]  Psychiatric & Orthopedics (5N 1-16) Clinical Social Worker

## 2014-04-12 NOTE — Discharge Summary (Signed)
PATIENT DETAILS Name: Adam Fitzpatrick Age: 33 y.o. Sex: male Date of Birth: [birth year] MRN: [id number]. Admitting Physician: Jonetta Osgood, MD RCB:[id number] Birdie Riddle, MD  Admit Date: 04/09/2014 Discharge date: 04/12/2014  Recommendations for Outpatient Follow-up:  1. Please repeat CBC/chemistry in 1 week 2. Please arrange for follow-up with Dr. Octavia Bruckner Murphy-orthopedics in 2 weeks 3. Deferring further workup for osteoporosis/hip fracture at age of 33-to PCP.   PRIMARY DISCHARGE DIAGNOSIS:  Active Problems:   DM type 2 (diabetes mellitus, type 2)   Mental retardation   Intertrochanteric fracture of left hip   Hip fracture      PAST MEDICAL HISTORY: Past Medical History  Diagnosis Date  . MR (mental retardation), moderate   . Diabetes mellitus   . Asthma   . GERD (gastroesophageal reflux disease)   . Hyperlipidemia     notes only hx of hyperlipidemia  . Blood in urine     DISCHARGE MEDICATIONS: Current Discharge Medication List    START taking these medications   Details  aspirin EC 325 MG tablet Take 1 tablet (325 mg total) by mouth daily. Qty: 30 tablet, Refills: 0    docusate sodium (COLACE) 100 MG capsule Take 1 capsule (100 mg total) by mouth 2 (two) times daily. Continue this while taking narcotics to help with bowel movements Qty: 30 capsule, Refills: 1    HYDROcodone-acetaminophen (NORCO) 5-325 MG per tablet Take 1-2 tablets by mouth every 6 (six) hours as needed for moderate pain. Qty: 30 tablet, Refills: 0    methocarbamol (ROBAXIN) 500 MG tablet Take 1 tablet (500 mg total) by mouth every 6 (six) hours as needed. Qty: 40 tablet, Refills: 1      CONTINUE these medications which have NOT CHANGED   Details  acetaminophen (TYLENOL) 500 MG tablet Take 500 mg by mouth every 4 (four) hours as needed (pain; fever >100).     albuterol (PROVENTIL HFA;VENTOLIN HFA) 108 (90 BASE) MCG/ACT inhaler Inhale 2 puffs into the lungs 4 (four) times daily as needed for  wheezing or shortness of breath. Shortness of breath/wheezing    clotrimazole (LOTRIMIN) 1 % cream Apply 1 application topically 2 (two) times daily as needed (rash; jock itch).    diphenhydrAMINE (BENADRYL) 25 MG tablet Take 25-50 mg by mouth every 6 (six) hours as needed (allergic reaction). Take 2 tablets (50 mg) for ingestion of shellfish and seek emergency help.    fluticasone (FLONASE) 50 MCG/ACT nasal spray Place 1 spray into both nostrils daily as needed for allergies or rhinitis.     guaiFENesin-dextromethorphan (ROBITUSSIN DM) 100-10 MG/5ML syrup Take 15 mLs by mouth every 4 (four) hours as needed for cough (cold symptoms). Not to exceed one week without notifying physician    loperamide (IMODIUM A-D) 2 MG tablet Take 2 mg by mouth as needed for diarrhea or loose stools. Take 2 caplets  or 4 tsp (20 mls) liquid by mouth after first loose stool and 1 caplet or 1 tsp (5 mls) liquid by mouth for each subsequent stool as needed; not to exceed 8 caplets or 50 mls in 24 hours    loratadine (CLARITIN) 10 MG tablet Take 1 tablet (10 mg total) by mouth daily. Qty: 30 tablet, Refills: 11   Associated Diagnoses: Serous otitis media    losartan (COZAAR) 25 MG tablet Take 25 mg by mouth daily.     metFORMIN (GLUCOPHAGE) 1000 MG tablet TAKE 1 TABLET BY MOUTH TWICE DAILY WITH A MEAL. Qty: 60 tablet, Refills: 6  montelukast (SINGULAIR) 10 MG tablet Take 10 mg by mouth daily.     naproxen sodium (ALEVE) 220 MG tablet Take 220 mg by mouth every 8 (eight) hours as needed (pain).    omeprazole (PRILOSEC) 20 MG capsule TAKE 1 CAPSULE BY MOUTH ONCE DAILY. Qty: 31 capsule, Refills: 5    naproxen (NAPROSYN) 500 MG tablet TAKE 1 TABLET BY MOUTH TWICE DAILY WITH A MEAL. as needed      STOP taking these medications     ibuprofen (ADVIL,MOTRIN) 200 MG tablet         ALLERGIES:   Allergies  Allergen Reactions  . Shellfish Allergy Anaphylaxis  . Prednisone Itching    BRIEF HPI:  See H&P,  Labs, Consult and Test reports for all details in brief, patient was admitted for left hip fracture after a mechanical fall.  CONSULTATIONS:   orthopedic surgery  PERTINENT RADIOLOGIC STUDIES: Dg Hip Complete Left  04/09/2014   CLINICAL DATA:  Rollerskating injury, left hip pain  EXAM: LEFT HIP - COMPLETE 2+ VIEW  COMPARISON:  None.  FINDINGS: Intertrochanteric left hip fracture. Foreshortening with varus angulation.  Visualized bony pelvis appears intact.  IMPRESSION: Intertrochanteric left hip fracture.   Electronically Signed   By: Julian Hy M.D.   On: 04/09/2014 14:48   Dg Hip Operative Left  04/10/2014   CLINICAL DATA:  Intraoperative fixation left hip fracture  EXAM: OPERATIVE LEFT HIP  COMPARISON:  04/09/2014  FINDINGS: Three intraprocedural fluoroscopic images demonstrate left intra medullary nail fixation of the previously seen intertrochanteric fracture. No evidence for hardware failure. Fracture fragments are in near anatomic alignment.  IMPRESSION: Intraoperative imaging as above.   Electronically Signed   By: Conchita Paris M.D.   On: 04/10/2014 09:07   Dg Femur Left  04/09/2014   CLINICAL DATA:  33 year old male unable to move left lower extremity secondary to pain  EXAM: LEFT FEMUR - 2 VIEW  COMPARISON:  Concurrently obtained radiographs of the pelvis  FINDINGS: Intertrochanteric left femoral fracture with varus angulation of the hip joint. The visualized bony pelvis appears intact. The femoral head remains located on the cross-table lateral view. The remainder of the femur and visualize knee are unremarkable. Normal bony mineralization. No liver blastic osseous lesion. No knee joint effusion.  IMPRESSION: Intertrochanteric left femoral fracture with varus angulation.   Electronically Signed   By: Jacqulynn Cadet M.D.   On: 04/09/2014 14:50   Pelvis Portable  04/10/2014   CLINICAL DATA:  Postoperative exam after left femoral nailing  EXAM: PORTABLE PELVIS 1-2 VIEWS   COMPARISON:  04/09/2014  FINDINGS: Expected postoperative appearance after left femoral dynamic nail fixation of previously seen intertrochanteric fracture. Fracture fragments are in near anatomic alignment. Soft tissue gas is present. No evidence for hardware failure. Right femoral probable bone island incidentally noted.  IMPRESSION: Expected postoperative appearance after left femoral intra medullary nail fixation.   Electronically Signed   By: Conchita Paris M.D.   On: 04/10/2014 10:55   Chest Portable 1 View  04/09/2014   CLINICAL DATA:  Preoperative clearance for hip surgery  EXAM: PORTABLE CHEST - 1 VIEW  COMPARISON:  11/18/2009  FINDINGS: Normal mediastinum and cardiac silhouette. Normal pulmonary vasculature. No evidence of effusion, infiltrate, or pneumothorax. No acute bony abnormality.  IMPRESSION: Normal chest radiograph.   Electronically Signed   By: Suzy Bouchard M.D.   On: 04/09/2014 21:50     PERTINENT LAB RESULTS: CBC:  Recent Labs  04/11/14 0507 04/12/14 0742  WBC  7.8 5.6  HGB 11.4* 10.3*  HCT 33.2* 30.2*  PLT 234 205   CMET CMP     Component Value Date/Time   NA 135* 04/11/2014 0507   K 3.9 04/11/2014 0507   CL 100 04/11/2014 0507   CO2 22 04/11/2014 0507   GLUCOSE 297* 04/11/2014 0507   BUN 5* 04/11/2014 0507   CREATININE 0.56 04/11/2014 0507   CALCIUM 8.8 04/11/2014 0507   PROT 7.6 02/09/2014 1044   ALBUMIN 4.4 02/09/2014 1044   AST 12 02/09/2014 1044   ALT 11 02/09/2014 1044   ALKPHOS 46 02/09/2014 1044   BILITOT 1.5* 02/09/2014 1044   GFRNONAA >90 04/11/2014 0507   GFRAA >90 04/11/2014 0507    GFR Estimated Creatinine Clearance: 124.1 mL/min (by C-G formula based on Cr of 0.56). No results for input(s): LIPASE, AMYLASE in the last 72 hours. No results for input(s): CKTOTAL, CKMB, CKMBINDEX, TROPONINI in the last 72 hours. Invalid input(s): POCBNP No results for input(s): DDIMER in the last 72 hours. No results for input(s): HGBA1C in the last  72 hours. No results for input(s): CHOL, HDL, LDLCALC, TRIG, CHOLHDL, LDLDIRECT in the last 72 hours. No results for input(s): TSH, T4TOTAL, T3FREE, THYROIDAB in the last 72 hours.  Invalid input(s): FREET3 No results for input(s): VITAMINB12, FOLATE, FERRITIN, TIBC, IRON, RETICCTPCT in the last 72 hours. Coags: No results for input(s): INR in the last 72 hours.  Invalid input(s): PT Microbiology: Recent Results (from the past 240 hour(s))  MRSA PCR Screening     Status: Abnormal   Collection Time: 04/10/14  4:15 AM  Result Value Ref Range Status   MRSA by PCR POSITIVE (A) NEGATIVE Final    Comment:        The GeneXpert MRSA Assay (FDA approved for NASAL specimens only), is one component of a comprehensive MRSA colonization surveillance program. It is not intended to diagnose MRSA infection nor to guide or monitor treatment for MRSA infections. RESULT CALLED TO, READ BACK BY AND VERIFIED WITH: STEVENS,A RN [phone] 04/10/14 [place] COURSE:    Intertrochanteric fracture of left XIP:JASNKN post intramedullary nail on 11/8.Per orthopedics to use aspirin for DVT prophylaxis. No major issues post operatively.Will defer to PCP further work up regarding osteoporosis/Hip fracture at this age. Explained this to patient's mother/aunt (retired RN)-who was agreeable.Being discharged to skilled nursing facility for further subacute rehabilitation.  DM type 2 (diabetes mellitus, type 2):CBGs in mid 200's range with SSI. Will resume metformin on discharge  Hypertension: controlled,continue losartan  GERD: continue PPI  Mental retardation:currently at baseline, used to live in a group home.   TODAY-DAY OF DISCHARGE:  Subjective:   Champ Keetch today has no headache,no chest abdominal pain,no new weakness tingling or numbness, feels much better wants to go home today.   Objective:   Blood pressure 122/78, pulse 98, temperature 98.4 F (36.9 C), temperature  source Oral, resp. rate 18, height 5\' 10"  (1.778 m), weight 66.225 kg (146 lb), SpO2 99 %.  Intake/Output Summary (Last 24 hours) at 04/12/14 0917 Last data filed at 04/11/14 1730  Gross per 24 hour  Intake    600 ml  Output    450 ml  Net    150 ml   Filed Weights   04/09/14 1514  Weight: 66.225 kg (146 lb)    Exam Awake Alert, Oriented *3, No new F.N deficits, Normal affect [place].AT,PERRAL Supple Neck,No JVD, No cervical lymphadenopathy appriciated.  Symmetrical Chest wall  movement, Good air movement bilaterally, CTAB RRR,No Gallops,Rubs or new Murmurs, No Parasternal Heave +ve B.Sounds, Abd Soft, Non tender, No organomegaly appriciated, No rebound -guarding or rigidity. No Cyanosis, Clubbing or edema, No new Rash or bruise  DISCHARGE CONDITION: Stable  DISPOSITION: SNF  DISCHARGE INSTRUCTIONS:    Activity:  As tolerated with Full fall precautions use walker/cane & assistance as needed  Diet recommendation: Diabetic Diet Heart Healthy diet  Discharge Instructions    Call MD for:  redness, tenderness, or signs of infection (pain, swelling, redness, odor or green/yellow discharge around incision site)    Complete by:  As directed      Diet - low sodium heart healthy    Complete by:  As directed      Diet Carb Modified    Complete by:  As directed      Increase activity slowly    Complete by:  As directed      Weight bearing as tolerated    Complete by:  As directed            Follow-up Information    Follow up with MURPHY, TIMOTHY, D, MD In 1 week.   Specialty:  Orthopedic Surgery   Contact information:   [address] [place] [address] [phone]       Follow up with Annye Asa, MD. Schedule an appointment as soon as possible for a visit in 1 week.   Specialty:  Family Medicine   Contact information:   [place] [id number] [phone]      Total Time spent on discharge equals 45  minutes.  SignedOren Binet 04/12/2014 9:17 AM

## 2014-04-12 NOTE — Progress Notes (Signed)
Inpatient Rehabilitation  I spoke with the patient, his mother and his aunt Tessie Fass about post acute rehab plans.  I unfortunately do not have a bed available for the patient today  and note the plan is for [facility] for rehab.  Patsy confirms this plan.  I will sign off.  Please call if questions.  [facility] Admissions Coordinator Cell [phone] Office [phone]

## 2014-04-14 ENCOUNTER — Encounter: Payer: Self-pay | Admitting: Adult Health

## 2014-04-14 ENCOUNTER — Non-Acute Institutional Stay (SKILLED_NURSING_FACILITY): Payer: Medicare Other | Admitting: Adult Health

## 2014-04-14 DIAGNOSIS — E11349 Type 2 diabetes mellitus with severe nonproliferative diabetic retinopathy without macular edema: Secondary | ICD-10-CM | POA: Diagnosis not present

## 2014-04-14 DIAGNOSIS — K5900 Constipation, unspecified: Secondary | ICD-10-CM | POA: Diagnosis not present

## 2014-04-14 DIAGNOSIS — K219 Gastro-esophageal reflux disease without esophagitis: Secondary | ICD-10-CM | POA: Diagnosis not present

## 2014-04-14 DIAGNOSIS — S72002S Fracture of unspecified part of neck of left femur, sequela: Secondary | ICD-10-CM | POA: Diagnosis not present

## 2014-04-14 DIAGNOSIS — I10 Essential (primary) hypertension: Secondary | ICD-10-CM

## 2014-04-14 DIAGNOSIS — J4520 Mild intermittent asthma, uncomplicated: Secondary | ICD-10-CM | POA: Diagnosis not present

## 2014-04-14 DIAGNOSIS — F79 Unspecified intellectual disabilities: Secondary | ICD-10-CM

## 2014-04-14 DIAGNOSIS — E113499 Type 2 diabetes mellitus with severe nonproliferative diabetic retinopathy without macular edema, unspecified eye: Secondary | ICD-10-CM

## 2014-04-15 ENCOUNTER — Encounter: Payer: Self-pay | Admitting: Internal Medicine

## 2014-04-15 ENCOUNTER — Non-Acute Institutional Stay (SKILLED_NURSING_FACILITY): Payer: Medicare Other | Admitting: Internal Medicine

## 2014-04-15 DIAGNOSIS — S72142S Displaced intertrochanteric fracture of left femur, sequela: Secondary | ICD-10-CM

## 2014-04-15 DIAGNOSIS — K219 Gastro-esophageal reflux disease without esophagitis: Secondary | ICD-10-CM | POA: Diagnosis not present

## 2014-04-15 DIAGNOSIS — D62 Acute posthemorrhagic anemia: Secondary | ICD-10-CM

## 2014-04-15 DIAGNOSIS — K5900 Constipation, unspecified: Secondary | ICD-10-CM | POA: Diagnosis not present

## 2014-04-15 DIAGNOSIS — I10 Essential (primary) hypertension: Secondary | ICD-10-CM

## 2014-04-15 DIAGNOSIS — E11349 Type 2 diabetes mellitus with severe nonproliferative diabetic retinopathy without macular edema: Secondary | ICD-10-CM

## 2014-04-15 DIAGNOSIS — E113499 Type 2 diabetes mellitus with severe nonproliferative diabetic retinopathy without macular edema, unspecified eye: Secondary | ICD-10-CM

## 2014-04-15 NOTE — Progress Notes (Signed)
Patient ID: Adam Fitzpatrick, male   DOB: 10-05-1980, 33 y.o.   MRN: [id number]    [facility]  Chief Complaint  Patient presents with  . New Admit To SNF   Allergies  Allergen Reactions  . Shellfish Allergy Anaphylaxis  . Prednisone Itching  . Iohexol     IVP Dye   Code status: full code  HPI 33 y/o male pt is here for STR post hospital admission with a fall and left hip fracture. He underwent IM nailing. He has history of MR, GERD, HTN and DM among others. He resides in [facility] group home. He is seen in his room today. His pain is under control. Denies muscle spasm. He has not had a bowel movement in 3 days. He is using walker and cane with therapy and is WBAT. No other concerns.   Review of Systems  Constitutional: Negative for fever, chills  HENT: Negative for congestion Respiratory: Negative for cough, shortness of breath and wheezing.   Cardiovascular: Negative for chest pain, palpitations.  Gastrointestinal: Negative for heartburn, nausea, vomiting, abdominal pain Genitourinary: Negative for dysuria, flank pain.  Musculoskeletal: Negative for back pain  Skin: Negative for itching and rash.  Neurological: Negative for dizziness, tingling, focal weakness and headaches.  Psychiatric/Behavioral: Negative for depression   Past Medical History  Diagnosis Date  . MR (mental retardation), moderate   . Diabetes mellitus   . Asthma   . GERD (gastroesophageal reflux disease)   . Hyperlipidemia     notes only hx of hyperlipidemia  . Blood in urine   . MRSA (methicillin resistant staph aureus) culture positive    Past Surgical History  Procedure Laterality Date  . Intramedullary (im) nail intertrochanteric Left 04/10/2014  . Intramedullary (im) nail intertrochanteric Left 04/10/2014    Procedure: INTRAMEDULLARY (IM) NAIL INTERTROCHANTRIC LEFT HIP;  Surgeon: Renette Butters, MD;  Location: [facility];  Service: Orthopedics;  Laterality: Left;  . Hip  surgery      Left   Medication reviewed. See MAR Family History  Problem Relation Age of Onset  . Alcohol abuse Father   . Cancer Maternal Grandmother   . Hyperlipidemia Maternal Grandmother   . Heart disease Maternal Grandmother   . Hypertension Maternal Grandmother   . Heart disease Maternal Grandfather   . Hypertension Maternal Grandfather   . Hyperlipidemia Maternal Grandfather   . Hyperlipidemia Paternal Grandmother   . Heart disease Paternal Grandmother   . Hypertension Paternal Grandmother   . Kidney disease Paternal Grandmother   . Diabetes Paternal Grandmother   . Heart disease Paternal Grandfather   . Hypertension Paternal Grandfather   . Hyperlipidemia Paternal Grandfather    History   Social History  . Marital Status: Single    Spouse Name: N/A    Number of Children: N/A  . Years of Education: N/A   Occupational History  . Not on file.   Social History Main Topics  . Smoking status: Never Smoker   . Smokeless tobacco: Never Used  . Alcohol Use: No  . Drug Use: No  . Sexual Activity: No   Other Topics Concern  . Not on file   Social History Narrative   Physical exam BP 114/76 mmHg  Pulse 88  Temp(Src) 99.3 F (37.4 C)  Resp 18  SpO2 95%  General- adult male in no acute distress Head- atraumatic, normocephalic Eyes- PERRLA, EOMI, no pallor, no icterus Neck- no lymphadenopathy Cardiovascular- normal s1,s2, no murmurs Respiratory- bilateral clear  to auscultation, no wheeze, no rhonchi, no crackles Abdomen- bowel sounds present, soft, non tender Musculoskeletal- able to move all 4 extremities, left hip ROM limited Neurological- no focal deficit Skin- 2 incision with steri strips in place and healing well, no signs of infection, skin tear on right inner foot area Psychiatry- alert and oriented to person, place and time, normal mood and affect  Assessment/plan  Left hip fracture S/p IM nailing. Has f/u with orthopedics. Pain under control.  Continue prn norco.Continue skin care. WBAT. Will have patient work with PT/OT as tolerated to regain strength and restore function.  Fall precautions are in place. Continue aspirin for dvt prophylaxis  Constipation On senna s, add colace 100 mg bid and miralax daily prn for now. Reassess  Diabetes mellitus, type II continue metformin 1000 mg bid  Hypertension  Stable, continue losartan 25 mg daily  GERD continue Prilosec 20 mg daily  Blood loss anemia Monitor h&h with pt being on aspirin   Labs ordered: cbc, cmp in 1 week  Family/ staff Communication: reviewed care plan with patient and nursing supervisor  Goals of care: short term rehabilitation  Blanchie Serve, MD  [facility] Adult [facility] [phone] (Monday-Friday 8 am - 5 pm) [phone] (afterhours)

## 2014-04-19 DIAGNOSIS — I10 Essential (primary) hypertension: Secondary | ICD-10-CM | POA: Insufficient documentation

## 2014-04-19 NOTE — Progress Notes (Signed)
Patient ID: Adam Fitzpatrick, male   DOB: [birth year], 33 y.o.   MRN: [id number]   04/14/14  Facility:  Nursing Home Location:  [place] Room Number: 283-1 LEVEL OF CARE:  SNF (31)   Chief Complaint  Patient presents with  . Hospitalization Follow-up    Left hip fracture S/P IM Nail, Diabetes Mellitus, Hypertension, GERD, Mental retardation, Anemia, Asthma and Constipation    HISTORY OF PRESENT ILLNESS:  This is a 33 year old male who has been admitted to [facility] on 04/12/14 from [facility] with Left hip fracture S/P IM Nail. He has been admitted for a short-term rehabilitation.  REASSESSMENT OF ONGOING PROBLEMS:  HTN: Pt 's HTN remains stable.  Denies CP, sob, DOE, pedal edema, headaches, dizziness or visual disturbances.  No complications from the medications currently being used.  Last BP : 114/76  GERD: pt's GERD is stable.  Denies ongoing heartburn, abd. Pain, nausea or vomiting.  Currently on a PPI & tolerates it without any adverse reactions.  DM:pt's DM remains stable.  Pt denies polyuria, polydipsia, polyphagia, changes in vision or hypoglycemic episodes.  No complications noted from the medication presently being used.   9/15 hemoglobin A1c is: 6.5   PAST MEDICAL HISTORY:  Past Medical History  Diagnosis Date  . MR (mental retardation), moderate   . Diabetes mellitus   . Asthma   . GERD (gastroesophageal reflux disease)   . Hyperlipidemia     notes only hx of hyperlipidemia  . Blood in urine     CURRENT MEDICATIONS: Reviewed per MAR/see medication list  Allergies  Allergen Reactions  . Shellfish Allergy Anaphylaxis  . Prednisone Itching     REVIEW OF SYSTEMS:  GENERAL: no change in appetite, no fatigue, no weight changes, no fever, chills or weakness RESPIRATORY: no cough, SOB, DOE, wheezing, hemoptysis CARDIAC: no chest pain, edema or palpitations GI: no abdominal pain, diarrhea, heart burn, nausea or  vomiting, +constipation  PHYSICAL EXAMINATION  GENERAL: no acute distress, normal body habitus EYES: conjunctivae normal, sclerae normal, normal eye lids; wears eyeglasses NECK: supple, trachea midline, no neck masses, no thyroid tenderness, no thyromegaly LYMPHATICS: no LAN in the neck, no supraclavicular LAN RESPIRATORY: breathing is even & unlabored, BS CTAB CARDIAC: RRR, no murmur,no extra heart sounds, no edema GI: abdomen soft, normal BS, no masses, no tenderness, no hepatomegaly, no splenomegaly EXTREMITIES:  Able to move all 4 extremities PSYCHIATRIC: the patient is alert & oriented to person, affect & behavior appropriate  LABS/RADIOLOGY: Labs reviewed: Basic Metabolic Panel:  Recent Labs  02/09/14 1044 04/09/14 1435 04/09/14 1928 04/11/14 0507  NA 139 138  --  135*  K 3.7 4.4  --  3.9  CL 102 100  --  100  CO2 30 24  --  22  GLUCOSE 102* 155*  --  297*  BUN 11 11  --  5*  CREATININE 0.7 0.70 0.66 0.56  CALCIUM 9.4 9.8  --  8.8   Liver Function Tests:  Recent Labs  10/06/13 1141 02/09/14 1044  AST 14 12  ALT 13 11  ALKPHOS 51 46  BILITOT 1.6* 1.5*  PROT 7.1 7.6  ALBUMIN 4.2 4.4   CBC:  Recent Labs  02/09/14 1044 04/09/14 1435 04/09/14 1928 04/11/14 0507 04/12/14 0742  WBC 5.7 14.3* 8.9 7.8 5.6  NEUTROABS 3.6 12.5*  --   --   --   HGB 14.6 13.6 12.8* 11.4* 10.3*  HCT 43.5 39.1 36.8* 33.2*  30.2*  MCV 91.3 89.1 87.2 88.3 88.3  PLT 266.0 270 278 234 205   Lipid Panel:  Recent Labs  10/06/13 1141 02/09/14 1044  HDL 42.90 49.60   CBG:  Recent Labs  04/11/14 2132 04/12/14 0637 04/12/14 1141  GLUCAP 215* 174* 270*    Dg Hip Complete Left  04/09/2014   CLINICAL DATA:  Rollerskating injury, left hip pain  EXAM: LEFT HIP - COMPLETE 2+ VIEW  COMPARISON:  None.  FINDINGS: Intertrochanteric left hip fracture. Foreshortening with varus angulation.  Visualized bony pelvis appears intact.  IMPRESSION: Intertrochanteric left hip fracture.    Electronically Signed   By: Julian Hy M.D.   On: 04/09/2014 14:48   Dg Hip Operative Left  04/10/2014   CLINICAL DATA:  Intraoperative fixation left hip fracture  EXAM: OPERATIVE LEFT HIP  COMPARISON:  04/09/2014  FINDINGS: Three intraprocedural fluoroscopic images demonstrate left intra medullary nail fixation of the previously seen intertrochanteric fracture. No evidence for hardware failure. Fracture fragments are in near anatomic alignment.  IMPRESSION: Intraoperative imaging as above.   Electronically Signed   By: Conchita Paris M.D.   On: 04/10/2014 09:07   Dg Femur Left  04/09/2014   CLINICAL DATA:  33 year old male unable to move left lower extremity secondary to pain  EXAM: LEFT FEMUR - 2 VIEW  COMPARISON:  Concurrently obtained radiographs of the pelvis  FINDINGS: Intertrochanteric left femoral fracture with varus angulation of the hip joint. The visualized bony pelvis appears intact. The femoral head remains located on the cross-table lateral view. The remainder of the femur and visualize knee are unremarkable. Normal bony mineralization. No liver blastic osseous lesion. No knee joint effusion.  IMPRESSION: Intertrochanteric left femoral fracture with varus angulation.   Electronically Signed   By: Jacqulynn Cadet M.D.   On: 04/09/2014 14:50   Pelvis Portable  04/10/2014   CLINICAL DATA:  Postoperative exam after left femoral nailing  EXAM: PORTABLE PELVIS 1-2 VIEWS  COMPARISON:  04/09/2014  FINDINGS: Expected postoperative appearance after left femoral dynamic nail fixation of previously seen intertrochanteric fracture. Fracture fragments are in near anatomic alignment. Soft tissue gas is present. No evidence for hardware failure. Right femoral probable bone island incidentally noted.  IMPRESSION: Expected postoperative appearance after left femoral intra medullary nail fixation.   Electronically Signed   By: Conchita Paris M.D.   On: 04/10/2014 10:55   Chest Portable 1  View  04/09/2014   CLINICAL DATA:  Preoperative clearance for hip surgery  EXAM: PORTABLE CHEST - 1 VIEW  COMPARISON:  11/18/2009  FINDINGS: Normal mediastinum and cardiac silhouette. Normal pulmonary vasculature. No evidence of effusion, infiltrate, or pneumothorax. No acute bony abnormality.  IMPRESSION: Normal chest radiograph.   Electronically Signed   By: Suzy Bouchard M.D.   On: 04/09/2014 21:50    ASSESSMENT/PLAN:  Left hip fracture status post IM nail - for rehabilitation Diabetes mellitus, type II - well controlled; continue metformin 1000 mg by mouth twice a day Hypertension - well controlled; continue losartan 25 mg by mouth daily GERD - stable; continue Prilosec 20 mg by mouth daily Mental retardation - continue supportive care Anemia, acute blood loss - stable; hemoglobin 10.3 Constipation - continue Colace 100 mg by mouth twice a day and start senna S2 tabs by mouth twice a day 4 days then daily at bedtime Asthma with allergic rhinitis - stable   Spent 50 minutes in patient care.    [facility], NP [employer] [phone]

## 2014-04-22 DIAGNOSIS — S72142D Displaced intertrochanteric fracture of left femur, subsequent encounter for closed fracture with routine healing: Secondary | ICD-10-CM | POA: Diagnosis not present

## 2014-04-25 ENCOUNTER — Non-Acute Institutional Stay (SKILLED_NURSING_FACILITY): Payer: Medicare Other | Admitting: Adult Health

## 2014-04-25 ENCOUNTER — Encounter: Payer: Self-pay | Admitting: Adult Health

## 2014-04-25 DIAGNOSIS — F79 Unspecified intellectual disabilities: Secondary | ICD-10-CM | POA: Diagnosis not present

## 2014-04-25 DIAGNOSIS — R21 Rash and other nonspecific skin eruption: Secondary | ICD-10-CM

## 2014-04-25 DIAGNOSIS — K219 Gastro-esophageal reflux disease without esophagitis: Secondary | ICD-10-CM

## 2014-04-25 DIAGNOSIS — J4520 Mild intermittent asthma, uncomplicated: Secondary | ICD-10-CM

## 2014-04-25 DIAGNOSIS — S72002S Fracture of unspecified part of neck of left femur, sequela: Secondary | ICD-10-CM

## 2014-04-25 DIAGNOSIS — E11349 Type 2 diabetes mellitus with severe nonproliferative diabetic retinopathy without macular edema: Secondary | ICD-10-CM

## 2014-04-25 DIAGNOSIS — I10 Essential (primary) hypertension: Secondary | ICD-10-CM

## 2014-04-25 DIAGNOSIS — K5900 Constipation, unspecified: Secondary | ICD-10-CM | POA: Diagnosis not present

## 2014-04-25 DIAGNOSIS — E113499 Type 2 diabetes mellitus with severe nonproliferative diabetic retinopathy without macular edema, unspecified eye: Secondary | ICD-10-CM

## 2014-04-25 DIAGNOSIS — D62 Acute posthemorrhagic anemia: Secondary | ICD-10-CM

## 2014-04-28 NOTE — Progress Notes (Signed)
Patient ID: Adam Fitzpatrick, male   DOB: [birth year], 33 y.o.   MRN: [id number]   04/25/14  Facility:  Nursing Home Location:  [place] Room Number: 388-8 LEVEL OF CARE:  SNF (31)   Chief Complaint  Patient presents with  . Discharge Note    Left hip fracture S/P IM nail, type 2 diabetes mellitus, essential hypertension, gastroesophageal reflux, mental retardation, asthma, constipation and skin rash    HISTORY OF PRESENT ILLNESS:  This is a 33 year old male who is for discharge to group home. DME: 3 in one commode and left strand crutch . He has been admitted to [facility] on 04/12/14 from [facility] with Left hip fracture S/P IM Nail. Patient was admitted to this facility for short-term rehabilitation after the patient's recent hospitalization.  Patient has completed SNF rehabilitation and therapy has cleared the patient for discharge.  REASSESSMENT OF ONGOING PROBLEMS:  HTN: Pt 's HTN remains stable.  Denies CP, sob, DOE, pedal edema, headaches, dizziness or visual disturbances.  No complications from the medications currently being used.  Last BP : 126/83  ASTHMA: The patient's asthma remains stable. Patient denies shortness of breath, dyspnea on exertion or wheezing. No complications reported from the medications currently being used.  CONSTIPATION: The constipation remains stable. No complications from the medications presently being used. Patient denies ongoing constipation, abdominal pain, nausea or vomiting.   PAST MEDICAL HISTORY:  Past Medical History  Diagnosis Date  . MR (mental retardation), moderate   . Diabetes mellitus   . Asthma   . GERD (gastroesophageal reflux disease)   . Hyperlipidemia     notes only hx of hyperlipidemia  . Blood in urine     CURRENT MEDICATIONS: Reviewed per MAR/see medication list  Allergies  Allergen Reactions  . Shellfish Allergy Anaphylaxis  . Prednisone Itching    REVIEW OF  SYSTEMS:  GENERAL: no change in appetite, no fatigue, no weight changes, no fever, chills or weakness RESPIRATORY: no cough, SOB, DOE, wheezing, hemoptysis CARDIAC: no chest pain, edema or palpitations GI: no abdominal pain, diarrhea, heart burn, nausea or vomiting  PHYSICAL EXAMINATION  GENERAL: no acute distress, normal body habitus SKIN: 3 pinpoint red rashes on sacral area NECK: supple, trachea midline, no neck masses, no thyroid tenderness, no thyromegaly LYMPHATICS: no LAN in the neck, no supraclavicular LAN RESPIRATORY: breathing is even & unlabored, BS CTAB CARDIAC: RRR, no murmur,no extra heart sounds, no edema GI: abdomen soft, normal BS, no masses, no tenderness, no hepatomegaly, no splenomegaly EXTREMITIES:  Able to move all 4 extremities; uses crutch when ambulationg PSYCHIATRIC: the patient is alert & oriented to person, affect & behavior appropriate  LABS/RADIOLOGY: 04/19/14  WBC 5.5 hemoglobin 11.2 hematocrit 33.3 MCV 90.5 sodium 135 potassium 4.4 glucose 260 BUN 18 creatinine 0.5 calcium 9.6 total protein 6.6 albumin 4.0 ALP 84 AST 12 ALT 20  total bilirubin 1.6 Labs reviewed: Basic Metabolic Panel:  Recent Labs  02/09/14 1044 04/09/14 1435 04/09/14 1928 04/11/14 0507  NA 139 138  --  135*  K 3.7 4.4  --  3.9  CL 102 100  --  100  CO2 30 24  --  22  GLUCOSE 102* 155*  --  297*  BUN 11 11  --  5*  CREATININE 0.7 0.70 0.66 0.56  CALCIUM 9.4 9.8  --  8.8   Liver Function Tests:  Recent Labs  10/06/13 1141 02/09/14 1044  AST 14 12  ALT 13  11  ALKPHOS 51 46  BILITOT 1.6* 1.5*  PROT 7.1 7.6  ALBUMIN 4.2 4.4   CBC:  Recent Labs  02/09/14 1044 04/09/14 1435 04/09/14 1928 04/11/14 0507 04/12/14 0742  WBC 5.7 14.3* 8.9 7.8 5.6  NEUTROABS 3.6 12.5*  --   --   --   HGB 14.6 13.6 12.8* 11.4* 10.3*  HCT 43.5 39.1 36.8* 33.2* 30.2*  MCV 91.3 89.1 87.2 88.3 88.3  PLT 266.0 270 278 234 205   Lipid Panel:  Recent Labs  10/06/13 1141  02/09/14 1044  HDL 42.90 49.60   CBG:  Recent Labs  04/11/14 2132 04/12/14 0637 04/12/14 1141  GLUCAP 215* 174* 270*    Dg Hip Complete Left  04/09/2014   CLINICAL DATA:  Rollerskating injury, left hip pain  EXAM: LEFT HIP - COMPLETE 2+ VIEW  COMPARISON:  None.  FINDINGS: Intertrochanteric left hip fracture. Foreshortening with varus angulation.  Visualized bony pelvis appears intact.  IMPRESSION: Intertrochanteric left hip fracture.   Electronically Signed   By: Julian Hy M.D.   On: 04/09/2014 14:48   Dg Hip Operative Left  04/10/2014   CLINICAL DATA:  Intraoperative fixation left hip fracture  EXAM: OPERATIVE LEFT HIP  COMPARISON:  04/09/2014  FINDINGS: Three intraprocedural fluoroscopic images demonstrate left intra medullary nail fixation of the previously seen intertrochanteric fracture. No evidence for hardware failure. Fracture fragments are in near anatomic alignment.  IMPRESSION: Intraoperative imaging as above.   Electronically Signed   By: Conchita Paris M.D.   On: 04/10/2014 09:07   Dg Femur Left  04/09/2014   CLINICAL DATA:  33 year old male unable to move left lower extremity secondary to pain  EXAM: LEFT FEMUR - 2 VIEW  COMPARISON:  Concurrently obtained radiographs of the pelvis  FINDINGS: Intertrochanteric left femoral fracture with varus angulation of the hip joint. The visualized bony pelvis appears intact. The femoral head remains located on the cross-table lateral view. The remainder of the femur and visualize knee are unremarkable. Normal bony mineralization. No liver blastic osseous lesion. No knee joint effusion.  IMPRESSION: Intertrochanteric left femoral fracture with varus angulation.   Electronically Signed   By: Jacqulynn Cadet M.D.   On: 04/09/2014 14:50   Pelvis Portable  04/10/2014   CLINICAL DATA:  Postoperative exam after left femoral nailing  EXAM: PORTABLE PELVIS 1-2 VIEWS  COMPARISON:  04/09/2014  FINDINGS: Expected postoperative appearance  after left femoral dynamic nail fixation of previously seen intertrochanteric fracture. Fracture fragments are in near anatomic alignment. Soft tissue gas is present. No evidence for hardware failure. Right femoral probable bone island incidentally noted.  IMPRESSION: Expected postoperative appearance after left femoral intra medullary nail fixation.   Electronically Signed   By: Conchita Paris M.D.   On: 04/10/2014 10:55   Chest Portable 1 View  04/09/2014   CLINICAL DATA:  Preoperative clearance for hip surgery  EXAM: PORTABLE CHEST - 1 VIEW  COMPARISON:  11/18/2009  FINDINGS: Normal mediastinum and cardiac silhouette. Normal pulmonary vasculature. No evidence of effusion, infiltrate, or pneumothorax. No acute bony abnormality.  IMPRESSION: Normal chest radiograph.   Electronically Signed   By: Suzy Bouchard M.D.   On: 04/09/2014 21:50    ASSESSMENT/PLAN:  Left hip fracture status post IM nail - has been admitted for short term rehabilitation and now for discharge to group home Diabetes mellitus, type II - well controlled; continue metformin 1000 mg by mouth twice a day Hypertension - well controlled; continue losartan 25 mg by mouth  daily GERD - stable; continue Prilosec 20 mg by mouth daily Mental retardation - continue supportive care Anemia, acute blood loss - stable; hemoglobin 11.2 Constipation - stable; continue senna S2 tabs by mouth daily at bedtime, Colace 100 mg by mouth twice a day and MiraLAX 17 g by mouth daily at bedtime Asthma with allergic rhinitis - stable Rash - apply hydrocortisone 1% cream to rashes on sacral area twice a day 2 weeks   I have filled out patient's discharge paperwork and written prescriptions.    DME provided: 31 commode and left strand crutch  Total discharge time: Greater than 30 minutes  Discharge time involved coordination of the discharge process with social worker, nursing staff and therapy department. Medical justification for DME  verified.     [facility], NP [employer] [phone]

## 2014-05-04 ENCOUNTER — Ambulatory Visit (INDEPENDENT_AMBULATORY_CARE_PROVIDER_SITE_OTHER): Payer: Medicare Other | Admitting: Physician Assistant

## 2014-05-04 ENCOUNTER — Encounter: Payer: Self-pay | Admitting: Physician Assistant

## 2014-05-04 VITALS — BP 119/91 | HR 106 | Temp 98.7°F | Resp 16 | Ht 70.0 in | Wt 146.1 lb

## 2014-05-04 DIAGNOSIS — R5383 Other fatigue: Secondary | ICD-10-CM | POA: Diagnosis not present

## 2014-05-04 DIAGNOSIS — Z13820 Encounter for screening for osteoporosis: Secondary | ICD-10-CM | POA: Diagnosis not present

## 2014-05-04 DIAGNOSIS — M810 Age-related osteoporosis without current pathological fracture: Secondary | ICD-10-CM | POA: Diagnosis not present

## 2014-05-04 DIAGNOSIS — B356 Tinea cruris: Secondary | ICD-10-CM

## 2014-05-04 DIAGNOSIS — Z8262 Family history of osteoporosis: Secondary | ICD-10-CM | POA: Diagnosis not present

## 2014-05-04 DIAGNOSIS — E559 Vitamin D deficiency, unspecified: Secondary | ICD-10-CM | POA: Diagnosis not present

## 2014-05-04 MED ORDER — FLUCONAZOLE 150 MG PO TABS: 150.0000 mg | ORAL_TABLET | Freq: Once | ORAL | Status: DC

## 2014-05-04 MED ORDER — CLOTRIMAZOLE 1 % EX CREA: 1.0000 "application " | TOPICAL_CREAM | Freq: Two times a day (BID) | CUTANEOUS | Status: DC

## 2014-05-04 NOTE — Progress Notes (Signed)
Patient presents to clinic today with his guardian Engineer, [employer]) who complains of ringworm of left lower extremity and inguinal region over the past several weeks.  Patient with history of tinea corporis treated around 4 months ago.  Aunt is unsure if symptoms completely resolved as patient was only treated for a week at the group home he lives at. Patient endorses itching but denies painful rash.  Denies drainage.  Past Medical History  Diagnosis Date  . MR (mental retardation), moderate   . Diabetes mellitus   . Asthma   . GERD (gastroesophageal reflux disease)   . Hyperlipidemia     notes only hx of hyperlipidemia  . Blood in urine   . MRSA (methicillin resistant staph aureus) culture positive     Current Outpatient Prescriptions on File Prior to Visit  Medication Sig Dispense Refill  . acetaminophen (TYLENOL) 500 MG tablet Take 500 mg by mouth every 4 (four) hours as needed (pain; fever >100).     Marland Kitchen albuterol (PROVENTIL HFA;VENTOLIN HFA) 108 (90 BASE) MCG/ACT inhaler Inhale 2 puffs into the lungs 4 (four) times daily as needed for wheezing or shortness of breath. Shortness of breath/wheezing    . aspirin EC 325 MG tablet Take 1 tablet (325 mg total) by mouth daily. 30 tablet 0  . diphenhydrAMINE (BENADRYL) 25 MG tablet Take 25-50 mg by mouth every 6 (six) hours as needed (allergic reaction). Take 2 tablets (50 mg) for ingestion of shellfish and seek emergency help.    . docusate sodium (COLACE) 100 MG capsule Take 1 capsule (100 mg total) by mouth 2 (two) times daily. Continue this while taking narcotics to help with bowel movements 30 capsule 1  . fluticasone (FLONASE) 50 MCG/ACT nasal spray Place 1 spray into both nostrils daily as needed for allergies or rhinitis.     Marland Kitchen guaiFENesin-dextromethorphan (ROBITUSSIN DM) 100-10 MG/5ML syrup Take 15 mLs by mouth every 4 (four) hours as needed for cough (cold symptoms). Not to exceed one week without notifying physician    .  HYDROcodone-acetaminophen (NORCO) 5-325 MG per tablet Take 1-2 tablets by mouth every 6 (six) hours as needed for moderate pain. 30 tablet 0  . loperamide (IMODIUM A-D) 2 MG tablet Take 2 mg by mouth as needed for diarrhea or loose stools. Take 2 caplets  or 4 tsp (20 mls) liquid by mouth after first loose stool and 1 caplet or 1 tsp (5 mls) liquid by mouth for each subsequent stool as needed; not to exceed 8 caplets or 50 mls in 24 hours    . loratadine (CLARITIN) 10 MG tablet Take 1 tablet (10 mg total) by mouth daily. (Patient taking differently: Take 10 mg by mouth daily as needed for allergies (sneezing, runny nose, coughing and watery, itchy eyes). ) 30 tablet 11  . losartan (COZAAR) 25 MG tablet Take 25 mg by mouth daily.     . metFORMIN (GLUCOPHAGE) 1000 MG tablet TAKE 1 TABLET BY MOUTH TWICE DAILY WITH A MEAL. 60 tablet 6  . methocarbamol (ROBAXIN) 500 MG tablet Take 1 tablet (500 mg total) by mouth every 6 (six) hours as needed. 40 tablet 1  . montelukast (SINGULAIR) 10 MG tablet Take 10 mg by mouth daily.     . naproxen (NAPROSYN) 500 MG tablet TAKE 1 TABLET BY MOUTH TWICE DAILY WITH A MEAL. as needed    . naproxen sodium (ALEVE) 220 MG tablet Take 220 mg by mouth every 8 (eight) hours as needed (pain).    Marland Kitchen  omeprazole (PRILOSEC) 20 MG capsule TAKE 1 CAPSULE BY MOUTH ONCE DAILY. 31 capsule 5   No current facility-administered medications on file prior to visit.    Allergies  Allergen Reactions  . Shellfish Allergy Anaphylaxis  . Prednisone Itching  . Iohexol     IVP Dye    Family History  Problem Relation Age of Onset  . Alcohol abuse Father   . Cancer Maternal Grandmother   . Hyperlipidemia Maternal Grandmother   . Heart disease Maternal Grandmother   . Hypertension Maternal Grandmother   . Heart disease Maternal Grandfather   . Hypertension Maternal Grandfather   . Hyperlipidemia Maternal Grandfather   . Hyperlipidemia Paternal Grandmother   . Heart disease Paternal  Grandmother   . Hypertension Paternal Grandmother   . Kidney disease Paternal Grandmother   . Diabetes Paternal Grandmother   . Heart disease Paternal Grandfather   . Hypertension Paternal Grandfather   . Hyperlipidemia Paternal Grandfather     History   Social History  . Marital Status: Single    Spouse Name: N/A    Number of Children: N/A  . Years of Education: N/A   Social History Main Topics  . Smoking status: Never Smoker   . Smokeless tobacco: Never Used  . Alcohol Use: No  . Drug Use: No  . Sexual Activity: No   Other Topics Concern  . None   Social History Narrative   Review of Systems - See HPI.  All other ROS are negative.  BP 119/91 mmHg  Pulse 106  Temp(Src) 98.7 F (37.1 C) (Oral)  Resp 16  Ht 5' 10"  (1.778 m)  Wt 146 lb 2 oz (66.282 kg)  BMI 20.97 kg/m2  SpO2 97%  Physical Exam  Constitutional: He is oriented to person, place, and time and well-developed, well-nourished, and in no distress.  HENT:  Head: Normocephalic and atraumatic.  Eyes: Conjunctivae are normal.  Cardiovascular: Normal rate, regular rhythm, normal heart sounds and intact distal pulses.   Pulmonary/Chest: Effort normal and breath sounds normal. No respiratory distress. He has no wheezes. He has no rales. He exhibits no tenderness.  Neurological: He is alert and oriented to person, place, and time.  Skin: Skin is warm and dry.     Psychiatric: Affect normal.  Vitals reviewed.   Recent Results (from the past 2160 hour(s))  Hemoglobin A1c     Status: None   Collection Time: 02/09/14 10:44 AM  Result Value Ref Range   Hgb A1c MFr Bld 6.5 4.6 - 6.5 %    Comment: Glycemic Control Guidelines for People with Diabetes:Non Diabetic:  <6%Goal of Therapy: <7%Additional Action Suggested:  >8%   Lipid panel     Status: None   Collection Time: 02/09/14 10:44 AM  Result Value Ref Range   Cholesterol 151 0 - 200 mg/dL    Comment: ATP III Classification       Desirable:  < 200 mg/dL                Borderline High:  200 - 239 mg/dL          High:  > = 240 mg/dL   Triglycerides 65.0 0.0 - 149.0 mg/dL    Comment: Normal:  <150 mg/dLBorderline High:  150 - 199 mg/dL   HDL 49.60 >39.00 mg/dL   VLDL 13.0 0.0 - 40.0 mg/dL   LDL Cholesterol 88 0 - 99 mg/dL   Total CHOL/HDL Ratio 3     Comment:  Men          Women1/2 Average Risk     3.4          3.3Average Risk          5.0          4.42X Average Risk          9.6          7.13X Average Risk          15.0          11.0                       NonHDL 101.40     Comment: NOTE:  Non-HDL goal should be 30 mg/dL higher than patient's LDL goal (i.e. LDL goal of < 70 mg/dL, would have non-HDL goal of < 100 mg/dL)  Basic metabolic panel     Status: Abnormal   Collection Time: 02/09/14 10:44 AM  Result Value Ref Range   Sodium 139 135 - 145 mEq/L   Potassium 3.7 3.5 - 5.1 mEq/L   Chloride 102 96 - 112 mEq/L   CO2 30 19 - 32 mEq/L   Glucose, Bld 102 (H) 70 - 99 mg/dL   BUN 11 6 - 23 mg/dL   Creatinine, Ser 0.7 0.4 - 1.5 mg/dL   Calcium 9.4 8.4 - 10.5 mg/dL   GFR 130.13 >60.00 mL/min  TSH     Status: None   Collection Time: 02/09/14 10:44 AM  Result Value Ref Range   TSH 1.14 0.35 - 4.50 uIU/mL  Hepatic function panel     Status: Abnormal   Collection Time: 02/09/14 10:44 AM  Result Value Ref Range   Total Bilirubin 1.5 (H) 0.2 - 1.2 mg/dL   Bilirubin, Direct 0.1 0.0 - 0.3 mg/dL   Alkaline Phosphatase 46 39 - 117 U/L   AST 12 0 - 37 U/L   ALT 11 0 - 53 U/L   Total Protein 7.6 6.0 - 8.3 g/dL   Albumin 4.4 3.5 - 5.2 g/dL  CBC with Differential     Status: None   Collection Time: 02/09/14 10:44 AM  Result Value Ref Range   WBC 5.7 4.0 - 10.5 K/uL   RBC 4.76 4.22 - 5.81 Mil/uL   Hemoglobin 14.6 13.0 - 17.0 g/dL   HCT 43.5 39.0 - 52.0 %   MCV 91.3 78.0 - 100.0 fl   MCHC 33.6 30.0 - 36.0 g/dL   RDW 12.7 11.5 - 15.5 %   Platelets 266.0 150.0 - 400.0 K/uL   Neutrophils Relative % 63.5 43.0 - 77.0 %   Lymphocytes  Relative 25.7 12.0 - 46.0 %   Monocytes Relative 5.7 3.0 - 12.0 %   Eosinophils Relative 4.8 0.0 - 5.0 %   Basophils Relative 0.3 0.0 - 3.0 %   Neutro Abs 3.6 1.4 - 7.7 K/uL   Lymphs Abs 1.5 0.7 - 4.0 K/uL   Monocytes Absolute 0.3 0.1 - 1.0 K/uL   Eosinophils Absolute 0.3 0.0 - 0.7 K/uL   Basophils Absolute 0.0 0.0 - 0.1 K/uL  CBC with Differential     Status: Abnormal   Collection Time: 04/09/14  2:35 PM  Result Value Ref Range   WBC 14.3 (H) 4.0 - 10.5 K/uL   RBC 4.39 4.22 - 5.81 MIL/uL   Hemoglobin 13.6 13.0 - 17.0 g/dL   HCT 39.1 39.0 - 52.0 %   MCV 89.1 78.0 - 100.0 fL  MCH 31.0 26.0 - 34.0 pg   MCHC 34.8 30.0 - 36.0 g/dL   RDW 11.9 11.5 - 15.5 %   Platelets 270 150 - 400 K/uL   Neutrophils Relative % 88 (H) 43 - 77 %   Neutro Abs 12.5 (H) 1.7 - 7.7 K/uL   Lymphocytes Relative 5 (L) 12 - 46 %   Lymphs Abs 0.8 0.7 - 4.0 K/uL   Monocytes Relative 7 3 - 12 %   Monocytes Absolute 1.0 0.1 - 1.0 K/uL   Eosinophils Relative 0 0 - 5 %   Eosinophils Absolute 0.0 0.0 - 0.7 K/uL   Basophils Relative 0 0 - 1 %   Basophils Absolute 0.0 0.0 - 0.1 K/uL  Basic metabolic panel     Status: Abnormal   Collection Time: 04/09/14  2:35 PM  Result Value Ref Range   Sodium 138 137 - 147 mEq/L   Potassium 4.4 3.7 - 5.3 mEq/L   Chloride 100 96 - 112 mEq/L   CO2 24 19 - 32 mEq/L   Glucose, Bld 155 (H) 70 - 99 mg/dL   BUN 11 6 - 23 mg/dL   Creatinine, Ser 0.70 0.50 - 1.35 mg/dL   Calcium 9.8 8.4 - 10.5 mg/dL   GFR calc non Af Amer >90 >90 mL/min   GFR calc Af Amer >90 >90 mL/min    Comment: (NOTE) The eGFR has been calculated using the CKD EPI equation. This calculation has not been validated in all clinical situations. eGFR's persistently <90 mL/min signify possible Chronic Kidney Disease.    Anion gap 14 5 - 15  Glucose, capillary     Status: Abnormal   Collection Time: 04/09/14  7:10 PM  Result Value Ref Range   Glucose-Capillary 103 (H) 70 - 99 mg/dL  CBC     Status: Abnormal    Collection Time: 04/09/14  7:28 PM  Result Value Ref Range   WBC 8.9 4.0 - 10.5 K/uL   RBC 4.22 4.22 - 5.81 MIL/uL   Hemoglobin 12.8 (L) 13.0 - 17.0 g/dL   HCT 36.8 (L) 39.0 - 52.0 %   MCV 87.2 78.0 - 100.0 fL   MCH 30.3 26.0 - 34.0 pg   MCHC 34.8 30.0 - 36.0 g/dL   RDW 12.1 11.5 - 15.5 %   Platelets 278 150 - 400 K/uL  Creatinine, serum     Status: None   Collection Time: 04/09/14  7:28 PM  Result Value Ref Range   Creatinine, Ser 0.66 0.50 - 1.35 mg/dL   GFR calc non Af Amer >90 >90 mL/min   GFR calc Af Amer >90 >90 mL/min    Comment: (NOTE) The eGFR has been calculated using the CKD EPI equation. This calculation has not been validated in all clinical situations. eGFR's persistently <90 mL/min signify possible Chronic Kidney Disease.   Glucose, capillary     Status: Abnormal   Collection Time: 04/09/14 11:41 PM  Result Value Ref Range   Glucose-Capillary 260 (H) 70 - 99 mg/dL  MRSA PCR Screening     Status: Abnormal   Collection Time: 04/10/14  4:15 AM  Result Value Ref Range   MRSA by PCR POSITIVE (A) NEGATIVE    Comment:        The GeneXpert MRSA Assay (FDA approved for NASAL specimens only), is one component of a comprehensive MRSA colonization surveillance program. It is not intended to diagnose MRSA infection nor to guide or monitor treatment for MRSA infections. RESULT CALLED TO,  READ BACK BY AND VERIFIED WITH: STEVENS,A RN [phone] 04/10/14 MITCHELL,L   Glucose, capillary     Status: Abnormal   Collection Time: 04/10/14  6:06 AM  Result Value Ref Range   Glucose-Capillary 150 (H) 70 - 99 mg/dL  Glucose, capillary     Status: Abnormal   Collection Time: 04/10/14  9:19 AM  Result Value Ref Range   Glucose-Capillary 175 (H) 70 - 99 mg/dL   Comment 1 Notify RN    Comment 2 Documented in Chart   Glucose, capillary     Status: Abnormal   Collection Time: 04/10/14 11:48 AM  Result Value Ref Range   Glucose-Capillary 169 (H) 70 - 99 mg/dL  Glucose, capillary      Status: Abnormal   Collection Time: 04/10/14  4:23 PM  Result Value Ref Range   Glucose-Capillary 198 (H) 70 - 99 mg/dL  Glucose, capillary     Status: Abnormal   Collection Time: 04/10/14  9:59 PM  Result Value Ref Range   Glucose-Capillary 235 (H) 70 - 99 mg/dL  CBC     Status: Abnormal   Collection Time: 04/11/14  5:07 AM  Result Value Ref Range   WBC 7.8 4.0 - 10.5 K/uL   RBC 3.76 (L) 4.22 - 5.81 MIL/uL   Hemoglobin 11.4 (L) 13.0 - 17.0 g/dL   HCT 33.2 (L) 39.0 - 52.0 %   MCV 88.3 78.0 - 100.0 fL   MCH 30.3 26.0 - 34.0 pg   MCHC 34.3 30.0 - 36.0 g/dL   RDW 12.1 11.5 - 15.5 %   Platelets 234 150 - 400 K/uL  Basic metabolic panel     Status: Abnormal   Collection Time: 04/11/14  5:07 AM  Result Value Ref Range   Sodium 135 (L) 137 - 147 mEq/L   Potassium 3.9 3.7 - 5.3 mEq/L   Chloride 100 96 - 112 mEq/L   CO2 22 19 - 32 mEq/L   Glucose, Bld 297 (H) 70 - 99 mg/dL   BUN 5 (L) 6 - 23 mg/dL   Creatinine, Ser 0.56 0.50 - 1.35 mg/dL   Calcium 8.8 8.4 - 10.5 mg/dL   GFR calc non Af Amer >90 >90 mL/min   GFR calc Af Amer >90 >90 mL/min    Comment: (NOTE) The eGFR has been calculated using the CKD EPI equation. This calculation has not been validated in all clinical situations. eGFR's persistently <90 mL/min signify possible Chronic Kidney Disease.    Anion gap 13 5 - 15  Glucose, capillary     Status: Abnormal   Collection Time: 04/11/14  7:03 AM  Result Value Ref Range   Glucose-Capillary 276 (H) 70 - 99 mg/dL  Glucose, capillary     Status: Abnormal   Collection Time: 04/11/14 12:24 PM  Result Value Ref Range   Glucose-Capillary 269 (H) 70 - 99 mg/dL  Glucose, capillary     Status: Abnormal   Collection Time: 04/11/14  4:29 PM  Result Value Ref Range   Glucose-Capillary 257 (H) 70 - 99 mg/dL  Glucose, capillary     Status: Abnormal   Collection Time: 04/11/14  9:32 PM  Result Value Ref Range   Glucose-Capillary 215 (H) 70 - 99 mg/dL   Comment 1 Notify RN     Glucose, capillary     Status: Abnormal   Collection Time: 04/12/14  6:37 AM  Result Value Ref Range   Glucose-Capillary 174 (H) 70 - 99 mg/dL  CBC  Status: Abnormal   Collection Time: 04/12/14  7:42 AM  Result Value Ref Range   WBC 5.6 4.0 - 10.5 K/uL   RBC 3.42 (L) 4.22 - 5.81 MIL/uL   Hemoglobin 10.3 (L) 13.0 - 17.0 g/dL   HCT 30.2 (L) 39.0 - 52.0 %   MCV 88.3 78.0 - 100.0 fL   MCH 30.1 26.0 - 34.0 pg   MCHC 34.1 30.0 - 36.0 g/dL   RDW 12.1 11.5 - 15.5 %   Platelets 205 150 - 400 K/uL  Glucose, capillary     Status: Abnormal   Collection Time: 04/12/14 11:41 AM  Result Value Ref Range   Glucose-Capillary 270 (H) 70 - 99 mg/dL    Assessment/Plan: Tinea cruris Will initiate oral and topical therapy.  Medications sent to pharmacy. Instructions written to group home.  Supportive measures discussed with patient and guardian.  Follow-up in 2 weeks.

## 2014-05-04 NOTE — Progress Notes (Signed)
Pre visit review using our clinic review tool, if applicable. No additional management support is needed unless otherwise documented below in the visit note/SLS  

## 2014-05-04 NOTE — Patient Instructions (Addendum)
Apply clotrimazole cream twice daily for 2-4 weeks, using until rash clears and then for 2-3 more days to ensure resolution.  Apply baby powders to groin region after shower to help keep area dry and prevent recurrence of symptoms. Give Diflucan 150 mg tablet by mouth once. Patient is to follow-up in 4 weeks.  Return sooner if needed.

## 2014-05-07 DIAGNOSIS — B356 Tinea cruris: Secondary | ICD-10-CM | POA: Insufficient documentation

## 2014-05-07 NOTE — Assessment & Plan Note (Signed)
Will initiate oral and topical therapy.  Medications sent to pharmacy. Instructions written to group home.  Supportive measures discussed with patient and guardian.  Follow-up in 2 weeks.

## 2014-05-11 DIAGNOSIS — S72142D Displaced intertrochanteric fracture of left femur, subsequent encounter for closed fracture with routine healing: Secondary | ICD-10-CM | POA: Diagnosis not present

## 2014-06-07 ENCOUNTER — Other Ambulatory Visit: Payer: Self-pay | Admitting: Physician Assistant

## 2014-06-07 NOTE — Telephone Encounter (Signed)
Rx request to pharmacy/SLS  

## 2014-06-08 DIAGNOSIS — S72142D Displaced intertrochanteric fracture of left femur, subsequent encounter for closed fracture with routine healing: Secondary | ICD-10-CM | POA: Diagnosis not present

## 2014-06-17 ENCOUNTER — Other Ambulatory Visit: Payer: Self-pay | Admitting: General Practice

## 2014-06-17 ENCOUNTER — Encounter: Payer: Self-pay | Admitting: General Practice

## 2014-06-17 MED ORDER — ALENDRONATE SODIUM 70 MG PO TABS: 70.0000 mg | ORAL_TABLET | ORAL | Status: DC

## 2014-06-27 ENCOUNTER — Telehealth: Payer: Self-pay | Admitting: Family Medicine

## 2014-06-27 ENCOUNTER — Other Ambulatory Visit: Payer: Self-pay | Admitting: General Practice

## 2014-06-27 MED ORDER — CLOTRIMAZOLE 1 % EX CREA: TOPICAL_CREAM | CUTANEOUS | Status: DC

## 2014-06-27 NOTE — Telephone Encounter (Signed)
Please send note to pharmacy indicating PRN use

## 2014-06-27 NOTE — Telephone Encounter (Signed)
Message sent

## 2014-06-27 NOTE — Telephone Encounter (Signed)
Caller name: patsy Relation to pt: aunt Call back number: [phone] Pharmacy:  Reason for call:   Patient aunt is stating that we send [place] a fax stating that clotrimazole (LOTRIMIN) is PRN and does not need to be sent out every month. P: [phone]

## 2014-07-26 ENCOUNTER — Encounter: Payer: Self-pay | Admitting: Family Medicine

## 2014-07-27 MED ORDER — NONFORMULARY OR COMPOUNDED ITEM: Status: DC

## 2014-07-27 NOTE — Telephone Encounter (Signed)
Order placed for calcium.   

## 2014-08-18 DIAGNOSIS — M810 Age-related osteoporosis without current pathological fracture: Secondary | ICD-10-CM | POA: Diagnosis not present

## 2014-08-18 DIAGNOSIS — E559 Vitamin D deficiency, unspecified: Secondary | ICD-10-CM | POA: Diagnosis not present

## 2014-09-09 ENCOUNTER — Other Ambulatory Visit: Payer: Self-pay | Admitting: Family Medicine

## 2014-09-09 NOTE — Telephone Encounter (Signed)
Med filled.  

## 2014-10-07 ENCOUNTER — Other Ambulatory Visit: Payer: Self-pay | Admitting: Family Medicine

## 2014-10-07 NOTE — Telephone Encounter (Signed)
meds filled

## 2014-10-20 ENCOUNTER — Telehealth: Payer: Self-pay | Admitting: *Deleted

## 2014-10-20 NOTE — Telephone Encounter (Signed)
FL2 form and standing orders dropped off for pt. Forwarded to Dr. Birdie Riddle. JG//CMA

## 2014-10-25 DIAGNOSIS — Z7689 Persons encountering health services in other specified circumstances: Secondary | ICD-10-CM

## 2014-10-26 NOTE — Telephone Encounter (Signed)
Received completed/signed forms back from Dr. Birdie Riddle. Called and informed Adam Fitzpatrick at the group home to inform her via message for forms are ready for pick up at our front desk. Copy sent for scanning. JG//CMA

## 2014-11-01 DIAGNOSIS — R0683 Snoring: Secondary | ICD-10-CM | POA: Diagnosis not present

## 2014-12-12 DIAGNOSIS — G4721 Circadian rhythm sleep disorder, delayed sleep phase type: Secondary | ICD-10-CM | POA: Diagnosis not present

## 2014-12-12 DIAGNOSIS — R0683 Snoring: Secondary | ICD-10-CM | POA: Diagnosis not present

## 2014-12-12 DIAGNOSIS — G4733 Obstructive sleep apnea (adult) (pediatric): Secondary | ICD-10-CM | POA: Diagnosis not present

## 2014-12-29 DIAGNOSIS — E559 Vitamin D deficiency, unspecified: Secondary | ICD-10-CM | POA: Diagnosis not present

## 2014-12-29 DIAGNOSIS — R5383 Other fatigue: Secondary | ICD-10-CM | POA: Diagnosis not present

## 2014-12-29 DIAGNOSIS — M810 Age-related osteoporosis without current pathological fracture: Secondary | ICD-10-CM | POA: Diagnosis not present

## 2015-03-24 DIAGNOSIS — Z23 Encounter for immunization: Secondary | ICD-10-CM | POA: Diagnosis not present

## 2015-06-09 ENCOUNTER — Other Ambulatory Visit: Payer: Self-pay | Admitting: Medical

## 2015-06-09 ENCOUNTER — Ambulatory Visit (INDEPENDENT_AMBULATORY_CARE_PROVIDER_SITE_OTHER): Payer: Medicare Other | Admitting: Medical

## 2015-06-09 ENCOUNTER — Encounter: Payer: Self-pay | Admitting: Medical

## 2015-06-09 VITALS — BP 110/80 | HR 82 | Temp 98.1°F | Ht 70.0 in | Wt 143.0 lb

## 2015-06-09 DIAGNOSIS — B49 Unspecified mycosis: Secondary | ICD-10-CM

## 2015-06-09 DIAGNOSIS — E119 Type 2 diabetes mellitus without complications: Secondary | ICD-10-CM

## 2015-06-09 DIAGNOSIS — B354 Tinea corporis: Secondary | ICD-10-CM

## 2015-06-09 LAB — COMPREHENSIVE METABOLIC PANEL
ALK PHOS: 45 U/L (ref 40–115)
ALT: 8 U/L — AB (ref 9–46)
AST: 7 U/L — ABNORMAL LOW (ref 10–40)
Albumin: 4.8 g/dL (ref 3.6–5.1)
BUN: 12 mg/dL (ref 7–25)
CALCIUM: 10.2 mg/dL (ref 8.6–10.3)
CO2: 28 mmol/L (ref 20–31)
Chloride: 98 mmol/L (ref 98–110)
Creat: 0.82 mg/dL (ref 0.60–1.35)
GLUCOSE: 206 mg/dL — AB (ref 65–99)
Potassium: 4.6 mmol/L (ref 3.5–5.3)
Sodium: 137 mmol/L (ref 135–146)
TOTAL PROTEIN: 7.3 g/dL (ref 6.1–8.1)
Total Bilirubin: 1.5 mg/dL — ABNORMAL HIGH (ref 0.2–1.2)

## 2015-06-09 LAB — HEMOGLOBIN A1C
Hgb A1c MFr Bld: 9 % — ABNORMAL HIGH (ref <5.7)
MEAN PLASMA GLUCOSE: 212 mg/dL — AB (ref <117)

## 2015-06-09 MED ORDER — NYSTATIN 100000 UNIT/GM EX CREA: 1.0000 "application " | TOPICAL_CREAM | Freq: Two times a day (BID) | CUTANEOUS | Status: DC

## 2015-06-09 NOTE — Progress Notes (Signed)
Subjective:    Patient ID: Adam Fitzpatrick, male    DOB: 1981-06-02, 35 y.o.   MRN: [id number]  HPI   Pt in for rash on his back. Symptoms on and off for a year(in past responded to clotrimazole). Just recently got reoccurence. Recently/2 weeks ago just started clotrimazole again . It looks a lot better per mom. Mom states the ring edged appearance was prominent and now subsiding.  Also some rash on his bottom and his groin area x 2 wks.   Pt is diabetic. No recent a1-c.   Review of Systems  Constitutional: Negative for fever, chills and fatigue.  Respiratory: Negative for cough, chest tightness, shortness of breath and wheezing.   Cardiovascular: Negative for chest pain and palpitations.  Musculoskeletal: Negative for back pain.  Skin:       Rash on groin and buttox.  Neurological: Negative for dizziness and headaches.  Hematological: Negative for adenopathy. Does not bruise/bleed easily.  Psychiatric/Behavioral: Negative for behavioral problems and confusion.   Past Medical History  Diagnosis Date  . MR (mental retardation), moderate   . Diabetes mellitus   . Asthma   . GERD (gastroesophageal reflux disease)   . Hyperlipidemia     notes only hx of hyperlipidemia  . Blood in urine   . MRSA (methicillin resistant staph aureus) culture positive     Social History   Social History  . Marital Status: Single    Spouse Name: N/A  . Number of Children: N/A  . Years of Education: N/A   Occupational History  . Not on file.   Social History Main Topics  . Smoking status: Never Smoker   . Smokeless tobacco: Never Used  . Alcohol Use: No  . Drug Use: No  . Sexual Activity: No   Other Topics Concern  . Not on file   Social History Narrative    Past Surgical History  Procedure Laterality Date  . Intramedullary (im) nail intertrochanteric Left 04/10/2014  . Intramedullary (im) nail intertrochanteric Left 04/10/2014    Procedure: INTRAMEDULLARY (IM) NAIL  INTERTROCHANTRIC LEFT HIP;  Surgeon: Renette Butters, MD;  Location: [facility];  Service: Orthopedics;  Laterality: Left;  . Hip surgery      Left    Family History  Problem Relation Age of Onset  . Alcohol abuse Father   . Cancer Maternal Grandmother   . Hyperlipidemia Maternal Grandmother   . Heart disease Maternal Grandmother   . Hypertension Maternal Grandmother   . Heart disease Maternal Grandfather   . Hypertension Maternal Grandfather   . Hyperlipidemia Maternal Grandfather   . Hyperlipidemia Paternal Grandmother   . Heart disease Paternal Grandmother   . Hypertension Paternal Grandmother   . Kidney disease Paternal Grandmother   . Diabetes Paternal Grandmother   . Heart disease Paternal Grandfather   . Hypertension Paternal Grandfather   . Hyperlipidemia Paternal Grandfather     Allergies  Allergen Reactions  . Shellfish Allergy Anaphylaxis  . Prednisone Itching  . Iohexol     IVP Dye    Current Outpatient Prescriptions on File Prior to Visit  Medication Sig Dispense Refill  . acetaminophen (TYLENOL) 500 MG tablet Take 500 mg by mouth every 4 (four) hours as needed (pain; fever >100).     Marland Kitchen albuterol (PROVENTIL HFA;VENTOLIN HFA) 108 (90 BASE) MCG/ACT inhaler Inhale 2 puffs into the lungs 4 (four) times daily as needed for wheezing or shortness of breath. Shortness of breath/wheezing    . aspirin EC  325 MG tablet Take 1 tablet (325 mg total) by mouth daily. 30 tablet 0  . clotrimazole (LOTRIMIN) 1 % cream APPLY AS DIRECTED 2 TIMES DAILY  FOR 2 TO 4 WEEKS UNTIL RASH CLEARS. USE FOR 2 DAYS AFTER RASH IS CLEARED. Medicaiton is only to be used as needed. 30 g 0  . diphenhydrAMINE (BENADRYL) 25 MG tablet Take 25-50 mg by mouth every 6 (six) hours as needed (allergic reaction). Take 2 tablets (50 mg) for ingestion of shellfish and seek emergency help.    . docusate sodium (COLACE) 100 MG capsule Take 1 capsule (100 mg total) by mouth 2 (two) times daily. Continue this while  taking narcotics to help with bowel movements 30 capsule 1  . fluticasone (FLONASE) 50 MCG/ACT nasal spray Place 1 spray into both nostrils daily as needed for allergies or rhinitis.     Marland Kitchen guaiFENesin-dextromethorphan (ROBITUSSIN DM) 100-10 MG/5ML syrup Take 15 mLs by mouth every 4 (four) hours as needed for cough (cold symptoms). Not to exceed one week without notifying physician    . loperamide (IMODIUM A-D) 2 MG tablet Take 2 mg by mouth as needed for diarrhea or loose stools. Take 2 caplets  or 4 tsp (20 mls) liquid by mouth after first loose stool and 1 caplet or 1 tsp (5 mls) liquid by mouth for each subsequent stool as needed; not to exceed 8 caplets or 50 mls in 24 hours    . loratadine (CLARITIN) 10 MG tablet Take 1 tablet (10 mg total) by mouth daily. (Patient taking differently: Take 10 mg by mouth daily as needed for allergies (sneezing, runny nose, coughing and watery, itchy eyes). ) 30 tablet 11  . losartan (COZAAR) 25 MG tablet TAKE 1 TABLET BY MOUTH ONCE DAILY. 31 tablet 6  . metFORMIN (GLUCOPHAGE) 1000 MG tablet TAKE 1 TABLET BY MOUTH TWICE DAILY WITH A MEAL. 60 tablet 6  . methocarbamol (ROBAXIN) 500 MG tablet Take 1 tablet (500 mg total) by mouth every 6 (six) hours as needed. 40 tablet 1  . montelukast (SINGULAIR) 10 MG tablet TAKE 1 TABLET BY MOUTH ONCE DAILY. 31 tablet 6  . naproxen (NAPROSYN) 500 MG tablet TAKE 1 TABLET BY MOUTH TWICE DAILY WITH A MEAL. as needed    . naproxen sodium (ALEVE) 220 MG tablet Take 220 mg by mouth every 8 (eight) hours as needed (pain).    . NONFORMULARY OR COMPOUNDED ITEM Pt can take Calcium 600mg  BID, this can either be w/ or w/o Vit D 400 or 800 units 1 each 0  . omeprazole (PRILOSEC) 20 MG capsule TAKE 1 CAPSULE BY MOUTH ONCE DAILY. 31 capsule 5   No current facility-administered medications on file prior to visit.    BP 110/80 mmHg  Pulse 82  Temp(Src) 98.1 F (36.7 C) (Oral)  Ht 5\' 10"  (1.778 m)  Wt 143 lb (64.864 kg)  BMI 20.52 kg/m2   SpO2 98%       Objective:   Physical Exam  General- No acute distress. Pleasant patient. Neck- Full range of motion, no jvd Lungs- Clear, even and unlabored. Heart- regular rate and rhythm. Neurologic- CNII- XII grossly intact.   Skin-  Beneath rt scapula 8 cm faint rash with raised edge about 50% around edge. Groin- slight hyperpigmented rash. Faint pink rash on buttox.(no warmth, tenderness or induration)        Assessment & Plan:  For fungal infection on back use lamisil otc.(handwritten rx)  For groin and buttox rash use  nystatin. After nystatin used if occasional rash could use lamisil as well.  For diabetes get cmp and a1-c  Follow up in 3-4 weeks or as needed

## 2015-06-09 NOTE — Progress Notes (Signed)
Pre visit review using our clinic review tool, if applicable. No additional management support is needed unless otherwise documented below in the visit note. 

## 2015-06-09 NOTE — Patient Instructions (Addendum)
For fungal infection on back use lamisil otc.(handwritten rx)  For groin and buttox rash use nystatin. After nystatin used if occasional rash could use lamisil as well.  For diabetes get cmp and a1-c  Follow up in 3-4 weeks or as needed

## 2015-06-12 ENCOUNTER — Telehealth: Payer: Self-pay | Admitting: Medical

## 2015-06-12 DIAGNOSIS — E1310 Other specified diabetes mellitus with ketoacidosis without coma: Secondary | ICD-10-CM

## 2015-06-12 MED ORDER — SAXAGLIPTIN HCL 5 MG PO TABS: 5.0000 mg | ORAL_TABLET | Freq: Every day | ORAL | Status: DC

## 2015-06-12 NOTE — Telephone Encounter (Signed)
Rx of onglyza sent to his pharmacy for diabetes. We may need to send over form for the pharmacy or his home that requires forms to be filled out. Also if issue of coverage for onglyza, then would you call down stairs see if they can run onglyza to determine cost.

## 2015-06-12 NOTE — Telephone Encounter (Signed)
Left message for aunt who is legal guardian to call back.

## 2015-06-13 ENCOUNTER — Encounter: Payer: Self-pay | Admitting: Medical

## 2015-06-13 LAB — CREATININE WITH EST GFR
CREATININE: 0.81 mg/dL (ref 0.60–1.35)
GFR, Est African American: >89 mL/min (ref >=60)

## 2015-06-15 NOTE — Telephone Encounter (Signed)
Rx faxed to Group Home 06/15/15. Awaiting confirmation.

## 2015-06-26 NOTE — Progress Notes (Signed)
Quick Note:  Pt has seen results on MyChart and message also sent for patient to call back if any questions. ______ 

## 2015-07-25 NOTE — Telephone Encounter (Signed)
Mother calling back regarding message below best # [phone]

## 2015-07-26 NOTE — Telephone Encounter (Signed)
Spoke with Aunt who is legal guardian and she states that the pharmacy has been changed to [facility] in [place] and the pt will need a Prodigy blood glucose monitor that is only covered by the current insurance. Pt checks blood sugar once in the morning, once at night, and PRN. Waiting for paperwork to be sent over.

## 2015-08-02 MED ORDER — PRODIGY LANCETS 28G MISC: Status: DC

## 2015-08-02 MED ORDER — PRODIGY AUTOCODE BLOOD GLUCOSE W/DEVICE KIT: PACK | Status: DC

## 2015-08-02 NOTE — Addendum Note (Signed)
Addended by: Tasia Catchings on: 08/02/2015 10:41 AM   Modules accepted: Orders, Medications

## 2015-08-02 NOTE — Telephone Encounter (Signed)
Orders sent to pharmacy

## 2015-08-17 ENCOUNTER — Encounter: Payer: Self-pay | Admitting: Family Medicine

## 2015-08-17 ENCOUNTER — Ambulatory Visit (INDEPENDENT_AMBULATORY_CARE_PROVIDER_SITE_OTHER): Payer: Medicare Other | Admitting: Family Medicine

## 2015-08-17 VITALS — BP 104/64 | HR 88 | Temp 98.0°F | Ht 70.0 in | Wt 145.1 lb

## 2015-08-17 DIAGNOSIS — Z23 Encounter for immunization: Secondary | ICD-10-CM

## 2015-08-17 DIAGNOSIS — Z0000 Encounter for general adult medical examination without abnormal findings: Secondary | ICD-10-CM

## 2015-08-17 DIAGNOSIS — I10 Essential (primary) hypertension: Secondary | ICD-10-CM

## 2015-08-17 DIAGNOSIS — E113499 Type 2 diabetes mellitus with severe nonproliferative diabetic retinopathy without macular edema, unspecified eye: Secondary | ICD-10-CM | POA: Diagnosis not present

## 2015-08-17 DIAGNOSIS — B354 Tinea corporis: Secondary | ICD-10-CM

## 2015-08-17 DIAGNOSIS — M810 Age-related osteoporosis without current pathological fracture: Secondary | ICD-10-CM | POA: Diagnosis not present

## 2015-08-17 DIAGNOSIS — R202 Paresthesia of skin: Secondary | ICD-10-CM

## 2015-08-17 DIAGNOSIS — E538 Deficiency of other specified B group vitamins: Secondary | ICD-10-CM

## 2015-08-17 DIAGNOSIS — K219 Gastro-esophageal reflux disease without esophagitis: Secondary | ICD-10-CM | POA: Diagnosis not present

## 2015-08-17 DIAGNOSIS — G629 Polyneuropathy, unspecified: Secondary | ICD-10-CM

## 2015-08-17 NOTE — Progress Notes (Signed)
Pre visit review using our clinic review tool, if applicable. No additional management support is needed unless otherwise documented below in the visit note. 

## 2015-08-17 NOTE — Patient Instructions (Addendum)
Smart wool socks Body Ringworm Ringworm (tinea corporis) is a fungal infection of the skin on the body. This infection is not caused by worms, but is actually caused by a fungus. Fungus normally lives on the top of your skin and can be useful. However, in the case of ringworms, the fungus grows out of control and causes a skin infection. It can involve any area of skin on the body and can spread easily from one person to another (contagious). Ringworm is a common problem for children, but it can affect adults as well. Ringworm is also often found in athletes, especially wrestlers who share equipment and mats.  CAUSES  Ringworm of the body is caused by a fungus called dermatophyte. It can spread by:  Touchingother people who are infected.  Touchinginfected pets.  Touching or sharingobjects that have been in contact with the infected person or pet (hats, combs, towels, clothing, sports equipment). SYMPTOMS   Itchy, raised red spots and bumps on the skin.  Ring-shaped rash.  Redness near the border of the rash with a clear center.  Dry and scaly skin on or around the rash. Not every person develops a ring-shaped rash. Some develop only the red, scaly patches. DIAGNOSIS  Most often, ringworm can be diagnosed by performing a skin exam. Your caregiver may choose to take a skin scraping from the affected area. The sample will be examined under the microscope to see if the fungus is present.  TREATMENT  Body ringworm may be treated with a topical antifungal cream or ointment. Sometimes, an antifungal shampoo that can be used on your body is prescribed. You may be prescribed antifungal medicines to take by mouth if your ringworm is severe, keeps coming back, or lasts a long time.  HOME CARE INSTRUCTIONS   Only take over-the-counter or prescription medicines as directed by your caregiver.  Wash the infected area and dry it completely before applying yourcream or ointment.  When using  antifungal shampoo to treat the ringworm, leave the shampoo on the body for 3-5 minutes before rinsing.   Wear loose clothing to stop clothes from rubbing and irritating the rash.  Wash or change your bed sheets every night while you have the rash.  Have your pet treated by your veterinarian if it has the same infection. To prevent ringworm:   Practice good hygiene.  Wear sandals or shoes in public places and showers.  Do not share personal items with others.  Avoid touching red patches of skin on other people.  Avoid touching pets that have bald spots or wash your hands after doing so. SEEK MEDICAL CARE IF:   Your rash continues to spread after 7 days of treatment.  Your rash is not gone in 4 weeks.  The area around your rash becomes red, warm, tender, and swollen.   This information is not intended to replace advice given to you by your health care provider. Make sure you discuss any questions you have with your health care provider.   Document Released: 05/17/2000 Document Revised: 02/12/2012 Document Reviewed: 12/02/2011 Elsevier Interactive Patient Education [employer].

## 2015-08-17 NOTE — Assessment & Plan Note (Signed)
No active lesions today but has had recurrent lesions, may need referral to dermatology if recurs encouraged to clean with witch hazel and Clotrimazole as needed

## 2015-08-17 NOTE — Assessment & Plan Note (Signed)
Well controlled, no changes to meds. Encouraged heart healthy diet such as the DASH diet and exercise as tolerated.  °

## 2015-08-17 NOTE — Assessment & Plan Note (Signed)
Avoid offending foods, start probiotics. Do not eat large meals in late evening and consider raising head of bed.  

## 2015-08-17 NOTE — Assessment & Plan Note (Signed)
hgba1c acceptable, minimize simple carbs. Increase exercise as tolerated. Continue current meds 

## 2015-08-17 NOTE — Progress Notes (Signed)
Subjective:    Patient ID: Adam Fitzpatrick, male    DOB: Nov 14, 1980, 35 y.o.   MRN: [id number]  Chief Complaint  Patient presents with  . Establish Care    HPI Patient is in today for new patient to establish care.  Patient having some numbness and cold in feet. Patient also having some kind of rash on his back, between legs, and buttocks area. Fingers have some numbness and coldness as well.  Patent has lost 75 pounds since being at the group home.  He is here today with his mother and she notes he is doing well. No recent illness or hospitalizations. Denies CP/palp/SOB/HA/congestion/fevers/GI or GU c/o. Taking meds as prescribed  Past Medical History  Diagnosis Date  . MR (mental retardation), moderate   . Diabetes mellitus   . Asthma   . GERD (gastroesophageal reflux disease)   . Hyperlipidemia     notes only hx of hyperlipidemia  . Blood in urine   . MRSA (methicillin resistant staph aureus) culture positive   . Routine general medical examination at a health care facility 10/15/2012    Past Surgical History  Procedure Laterality Date  . Intramedullary (im) nail intertrochanteric Left 04/10/2014  . Intramedullary (im) nail intertrochanteric Left 04/10/2014    Procedure: INTRAMEDULLARY (IM) NAIL INTERTROCHANTRIC LEFT HIP;  Surgeon: Renette Butters, MD;  Location: [facility];  Service: Orthopedics;  Laterality: Left;  . Hip surgery      Left    Family History  Problem Relation Age of Onset  . Alcohol abuse Father   . Cancer Maternal Grandmother   . Hyperlipidemia Maternal Grandmother   . Heart disease Maternal Grandmother   . Hypertension Maternal Grandmother   . Heart disease Maternal Grandfather   . Hypertension Maternal Grandfather   . Hyperlipidemia Maternal Grandfather   . Hyperlipidemia Paternal Grandmother   . Heart disease Paternal Grandmother   . Hypertension Paternal Grandmother   . Kidney disease Paternal Grandmother   . Diabetes Paternal Grandmother   .  Heart disease Paternal Grandfather   . Hypertension Paternal Grandfather   . Hyperlipidemia Paternal Grandfather     Social History   Social History  . Marital Status: Single    Spouse Name: N/A  . Number of Children: N/A  . Years of Education: N/A   Occupational History  . Not on file.   Social History Main Topics  . Smoking status: Never Smoker   . Smokeless tobacco: Never Used  . Alcohol Use: No  . Drug Use: No  . Sexual Activity: No   Other Topics Concern  . Not on file   Social History Narrative    Outpatient Prescriptions Prior to Visit  Medication Sig Dispense Refill  . acetaminophen (TYLENOL) 500 MG tablet Take 500 mg by mouth every 4 (four) hours as needed (pain; fever >100).     Marland Kitchen albuterol (PROVENTIL HFA;VENTOLIN HFA) 108 (90 BASE) MCG/ACT inhaler Inhale 2 puffs into the lungs 4 (four) times daily as needed for wheezing or shortness of breath. Shortness of breath/wheezing    . clotrimazole (LOTRIMIN) 1 % cream APPLY AS DIRECTED 2 TIMES DAILY  FOR 2 TO 4 WEEKS UNTIL RASH CLEARS. USE FOR 2 DAYS AFTER RASH IS CLEARED. Medicaiton is only to be used as needed. 30 g 0  . diphenhydrAMINE (BENADRYL) 25 MG tablet Take 25-50 mg by mouth every 6 (six) hours as needed (allergic reaction). Take 2 tablets (50 mg) for ingestion of shellfish and seek emergency help.    Marland Kitchen  docusate sodium (COLACE) 100 MG capsule Take 1 capsule (100 mg total) by mouth 2 (two) times daily. Continue this while taking narcotics to help with bowel movements (Patient taking differently: Take 100 mg by mouth 2 (two) times daily. Continue this while taking narcotics to help with bowel movements  PRN) 30 capsule 1  . fluticasone (FLONASE) 50 MCG/ACT nasal spray Place 1 spray into both nostrils daily as needed for allergies or rhinitis (PRN).     Marland Kitchen loperamide (IMODIUM A-D) 2 MG tablet Take 2 mg by mouth as needed for diarrhea or loose stools. Take 2 caplets  or 4 tsp (20 mls) liquid by mouth after first loose  stool and 1 caplet or 1 tsp (5 mls) liquid by mouth for each subsequent stool as needed; not to exceed 8 caplets or 50 mls in 24 hours    . losartan (COZAAR) 25 MG tablet TAKE 1 TABLET BY MOUTH ONCE DAILY. 31 tablet 6  . metFORMIN (GLUCOPHAGE) 1000 MG tablet TAKE 1 TABLET BY MOUTH TWICE DAILY WITH A MEAL. 60 tablet 6  . montelukast (SINGULAIR) 10 MG tablet TAKE 1 TABLET BY MOUTH ONCE DAILY. (Patient taking differently: TAKE 1 TABLET BY MOUTH ONCE DAILY. PRN) 31 tablet 6  . omeprazole (PRILOSEC) 20 MG capsule TAKE 1 CAPSULE BY MOUTH ONCE DAILY. (Patient taking differently: TAKE 1 CAPSULE BY MOUTH ONCE DAILY. PRN) 31 capsule 5  . saxagliptin HCl (ONGLYZA) 5 MG TABS tablet Take 1 tablet (5 mg total) by mouth daily. 30 tablet 3  . methocarbamol (ROBAXIN) 500 MG tablet Take 1 tablet (500 mg total) by mouth every 6 (six) hours as needed. 40 tablet 1  . guaiFENesin-dextromethorphan (ROBITUSSIN DM) 100-10 MG/5ML syrup Take 15 mLs by mouth every 4 (four) hours as needed for cough (cold symptoms). Reported on 08/17/2015    . loratadine (CLARITIN) 10 MG tablet Take 1 tablet (10 mg total) by mouth daily. (Patient not taking: Reported on 08/17/2015) 30 tablet 11  . nystatin cream (MYCOSTATIN) Apply 1 application topically 2 (two) times daily. (Patient not taking: Reported on 08/17/2015) 30 g 0  . aspirin EC 325 MG tablet Take 1 tablet (325 mg total) by mouth daily. 30 tablet 0  . Blood Glucose Monitoring Suppl (PRODIGY AUTOCODE BLOOD GLUCOSE) w/Device KIT Check blood sugar two times daily before meals and prn. (Patient not taking: Reported on 08/17/2015) 1 each 0  . naproxen (NAPROSYN) 500 MG tablet TAKE 1 TABLET BY MOUTH TWICE DAILY WITH A MEAL. as needed    . naproxen sodium (ALEVE) 220 MG tablet Take 220 mg by mouth every 8 (eight) hours as needed (pain).    . NONFORMULARY OR COMPOUNDED ITEM Pt can take Calcium 67m BID, this can either be w/ or w/o Vit D 400 or 800 units 1 each 0  . PRODIGY LANCETS 28G MISC Check  blood sugar two times before meals and prn. 100 each 10   No facility-administered medications prior to visit.    Allergies  Allergen Reactions  . Shellfish Allergy Anaphylaxis  . Iohexol     IVP Dye    Review of Systems  Constitutional: Negative for fever and malaise/fatigue.  HENT: Negative for congestion.   Eyes: Negative for blurred vision.  Respiratory: Negative for shortness of breath.   Cardiovascular: Negative for chest pain, palpitations and leg swelling.  Gastrointestinal: Negative for nausea, abdominal pain and blood in stool.  Genitourinary: Negative for dysuria and frequency.  Musculoskeletal: Negative for falls.  Skin: Negative for rash.  Neurological: Negative for dizziness, loss of consciousness and headaches.  Endo/Heme/Allergies: Negative for environmental allergies.  Psychiatric/Behavioral: Negative for depression. The patient is not nervous/anxious.        Objective:    Physical Exam  Constitutional: He is oriented to person, place, and time. He appears well-developed and well-nourished. No distress.  HENT:  Head: Normocephalic and atraumatic.  Eyes: Conjunctivae are normal.  Neck: Neck supple. No thyromegaly present.  Cardiovascular: Normal rate, regular rhythm and normal heart sounds.   No murmur heard. Pulmonary/Chest: Effort normal and breath sounds normal. No respiratory distress. He has no wheezes.  Abdominal: Soft. Bowel sounds are normal. He exhibits no mass. There is no tenderness.  Musculoskeletal: He exhibits no edema.  Lymphadenopathy:    He has no cervical adenopathy.  Neurological: He is alert and oriented to person, place, and time.  Skin: Skin is warm and dry.  Psychiatric: He has a normal mood and affect. His behavior is normal.    BP 104/64 mmHg  Pulse 88  Temp(Src) 98 F (36.7 C) (Oral)  Ht 5' 10"  (1.778 m)  Wt 145 lb 2 oz (65.828 kg)  BMI 20.82 kg/m2  SpO2 96% Wt Readings from Last 3 Encounters:  08/17/15 145 lb 2 oz  (65.828 kg)  06/09/15 143 lb (64.864 kg)  05/04/14 146 lb 2 oz (66.282 kg)     Lab Results  Component Value Date   WBC 5.6 04/12/2014   HGB 10.3* 04/12/2014   HCT 30.2* 04/12/2014   PLT 205 04/12/2014   GLUCOSE 206* 06/09/2015   CHOL 151 02/09/2014   TRIG 65.0 02/09/2014   HDL 49.60 02/09/2014   LDLCALC 88 02/09/2014   ALT 8* 06/09/2015   AST 7* 06/09/2015   NA 137 06/09/2015   K 4.6 06/09/2015   CL 98 06/09/2015   CREATININE 0.82 06/09/2015   CREATININE 0.81 06/09/2015   BUN 12 06/09/2015   CO2 28 06/09/2015   TSH 1.14 02/09/2014   HGBA1C 9.0* 06/09/2015   MICROALBUR 2.3* 10/23/2011    Lab Results  Component Value Date   TSH 1.14 02/09/2014   Lab Results  Component Value Date   WBC 5.6 04/12/2014   HGB 10.3* 04/12/2014   HCT 30.2* 04/12/2014   MCV 88.3 04/12/2014   PLT 205 04/12/2014   Lab Results  Component Value Date   NA 137 06/09/2015   K 4.6 06/09/2015   CO2 28 06/09/2015   GLUCOSE 206* 06/09/2015   BUN 12 06/09/2015   CREATININE 0.82 06/09/2015   CREATININE 0.81 06/09/2015   BILITOT 1.5* 06/09/2015   ALKPHOS 45 06/09/2015   AST 7* 06/09/2015   ALT 8* 06/09/2015   PROT 7.3 06/09/2015   ALBUMIN 4.8 06/09/2015   CALCIUM 10.2 06/09/2015   ANIONGAP 13 04/11/2014   GFR 130.13 02/09/2014   Lab Results  Component Value Date   CHOL 151 02/09/2014   Lab Results  Component Value Date   HDL 49.60 02/09/2014   Lab Results  Component Value Date   LDLCALC 88 02/09/2014   Lab Results  Component Value Date   TRIG 65.0 02/09/2014   Lab Results  Component Value Date   CHOLHDL 3 02/09/2014   Lab Results  Component Value Date   HGBA1C 9.0* 06/09/2015       Assessment & Plan:   Problem List Items Addressed This Visit    DM type 2 (diabetes mellitus, type 2) ([place])    hgba1c acceptable, minimize simple carbs. Increase exercise as tolerated.  Continue current meds      Relevant Orders   Hemoglobin A1c   Lipid panel   TSH   CBC    Comprehensive metabolic panel   VITAMIN D 25 Hydroxy (Vit-D Deficiency, Fractures)   B12   GERD (gastroesophageal reflux disease)    Avoid offending foods, start probiotics. Do not eat large meals in late evening and consider raising head of bed.       Hypertension    Well controlled, no changes to meds. Encouraged heart healthy diet such as the DASH diet and exercise as tolerated.       Relevant Orders   Hemoglobin A1c   Lipid panel   TSH   CBC   Comprehensive metabolic panel   VITAMIN D 25 Hydroxy (Vit-D Deficiency, Fractures)   B12   Routine general medical examination at a health care facility    Patient encouraged to maintain heart healthy diet, regular exercise, adequate sleep. Consider daily probiotics. Take medications as prescribed. Lives in a group home and forms completed today including FL2      Tinea corporis    No active lesions today but has had recurrent lesions, may need referral to dermatology if recurs encouraged to clean with witch hazel and Clotrimazole as needed       Other Visit Diagnoses    Osteoporosis    -  Primary    Relevant Medications    Calcium Carbonate (CALCIUM 600 PO)    Cholecalciferol (VITAMIN D) 2000 units CAPS    Other Relevant Orders    Hemoglobin A1c    Lipid panel    TSH    CBC    Comprehensive metabolic panel    VITAMIN D 25 Hydroxy (Vit-D Deficiency, Fractures)    B12    B12 deficiency        Relevant Orders    Hemoglobin A1c    Lipid panel    TSH    CBC    Comprehensive metabolic panel    VITAMIN D 25 Hydroxy (Vit-D Deficiency, Fractures)    B12    Paresthesia        Relevant Orders    Hemoglobin A1c    Lipid panel    TSH    CBC    Comprehensive metabolic panel    VITAMIN D 25 Hydroxy (Vit-D Deficiency, Fractures)    B12    Neuropathy (H[facility])        Relevant Orders    Hemoglobin A1c    Lipid panel    TSH    CBC    Comprehensive metabolic panel    VITAMIN D 25 Hydroxy (Vit-D Deficiency, Fractures)    B12     Need for 23-polyvalent pneumococcal polysaccharide vaccine        Need for vaccination with 13-polyvalent pneumococcal conjugate vaccine        Relevant Orders    Pneumococcal conjugate vaccine 13-valent IM (Completed)    Need for Tdap vaccination        Relevant Orders    Tdap vaccine greater than or equal to 7yo IM (Completed)       I have discontinued Mr. Vandervelden naproxen, naproxen sodium, aspirin EC, methocarbamol, NONFORMULARY OR COMPOUNDED ITEM, PRODIGY LANCETS 28G, Prodigy Autocode Blood Glucose, and Vitamin D3. I am also having him maintain his albuterol, omeprazole, guaiFENesin-dextromethorphan, acetaminophen, loperamide, loratadine, fluticasone, docusate sodium, diphenhydrAMINE, clotrimazole, metFORMIN, montelukast, losartan, nystatin cream, saxagliptin HCl, ibuprofen, Calcium Carbonate (CALCIUM 600 PO), and Vitamin D.  Meds ordered this encounter  Medications  . ibuprofen (ADVIL,MOTRIN) 200 MG tablet    Sig: Take 200 mg by mouth every 4 (four) hours as needed.  . Calcium Carbonate (CALCIUM 600 PO)    Sig: Take by mouth 2 (two) times daily.  Marland Kitchen DISCONTD: Cholecalciferol (VITAMIN D3) 3000 units TABS    Sig: Take by mouth.  . Cholecalciferol (VITAMIN D) 2000 units CAPS    Sig: Take by mouth.     Penni Homans, MD

## 2015-08-17 NOTE — Assessment & Plan Note (Signed)
Patient encouraged to maintain heart healthy diet, regular exercise, adequate sleep. Consider daily probiotics. Take medications as prescribed. Lives in a group home and forms completed today including FL2

## 2015-08-18 ENCOUNTER — Telehealth: Payer: Self-pay | Admitting: *Deleted

## 2015-08-18 NOTE — Telephone Encounter (Signed)
Called and spoke with the pt's mother and informed her that the forms(FL-2,Physicians' Standing Orders, and Adult Care Home-Personal Care Physician Authorization and Care Plan) has been filled out, signed, and ready for pick up.  Informed her that I will leave the forms up front for her to pick up.  She verbalized understanding.//AB/CMA

## 2015-11-17 ENCOUNTER — Other Ambulatory Visit (INDEPENDENT_AMBULATORY_CARE_PROVIDER_SITE_OTHER): Payer: Medicare Other

## 2015-11-17 DIAGNOSIS — R202 Paresthesia of skin: Secondary | ICD-10-CM

## 2015-11-17 DIAGNOSIS — E538 Deficiency of other specified B group vitamins: Secondary | ICD-10-CM

## 2015-11-17 DIAGNOSIS — G629 Polyneuropathy, unspecified: Secondary | ICD-10-CM | POA: Diagnosis not present

## 2015-11-17 DIAGNOSIS — E119 Type 2 diabetes mellitus without complications: Secondary | ICD-10-CM | POA: Diagnosis not present

## 2015-11-17 DIAGNOSIS — M810 Age-related osteoporosis without current pathological fracture: Secondary | ICD-10-CM

## 2015-11-17 DIAGNOSIS — E113499 Type 2 diabetes mellitus with severe nonproliferative diabetic retinopathy without macular edema, unspecified eye: Secondary | ICD-10-CM

## 2015-11-17 DIAGNOSIS — I10 Essential (primary) hypertension: Secondary | ICD-10-CM

## 2015-11-17 LAB — COMPREHENSIVE METABOLIC PANEL
ALBUMIN: 4.4 g/dL (ref 3.5–5.2)
ALT: 8 U/L (ref 0–53)
AST: 8 U/L (ref 0–37)
Alkaline Phosphatase: 43 U/L (ref 39–117)
BUN: 11 mg/dL (ref 6–23)
CALCIUM: 9.8 mg/dL (ref 8.4–10.5)
CO2: 31 meq/L (ref 19–32)
CREATININE: 0.76 mg/dL (ref 0.40–1.50)
Chloride: 98 mEq/L (ref 96–112)
GFR: 124.83 mL/min (ref >60.00)
Glucose, Bld: 240 mg/dL — ABNORMAL HIGH (ref 70–99)
Potassium: 3.7 mEq/L (ref 3.5–5.1)
Sodium: 137 mEq/L (ref 135–145)
Total Bilirubin: 1.6 mg/dL — ABNORMAL HIGH (ref 0.2–1.2)
Total Protein: 7 g/dL (ref 6.0–8.3)

## 2015-11-17 LAB — LIPID PANEL
CHOLESTEROL: 140 mg/dL (ref 0–200)
HDL: 48.4 mg/dL (ref >39.00)
LDL Cholesterol: 81 mg/dL (ref 0–99)
NONHDL: 91.92
Total CHOL/HDL Ratio: 3
Triglycerides: 55 mg/dL (ref 0.0–149.0)
VLDL: 11 mg/dL (ref 0.0–40.0)

## 2015-11-17 LAB — CBC
HCT: 41.1 % (ref 39.0–52.0)
Hemoglobin: 14.1 g/dL (ref 13.0–17.0)
MCHC: 34.2 g/dL (ref 30.0–36.0)
MCV: 88.1 fl (ref 78.0–100.0)
Platelets: 256 10*3/uL (ref 150.0–400.0)
RBC: 4.67 Mil/uL (ref 4.22–5.81)
RDW: 12.1 % (ref 11.5–15.5)
WBC: 5 10*3/uL (ref 4.0–10.5)

## 2015-11-17 LAB — VITAMIN D 25 HYDROXY (VIT D DEFICIENCY, FRACTURES): VITD: 72.84 ng/mL (ref 30.00–100.00)

## 2015-11-17 LAB — VITAMIN B12: Vitamin B-12: 549 pg/mL (ref 211–911)

## 2015-11-17 LAB — TSH: TSH: 0.96 u[IU]/mL (ref 0.35–4.50)

## 2015-11-17 LAB — HEMOGLOBIN A1C: HEMOGLOBIN A1C: 7.4 % — AB (ref 4.6–6.5)

## 2015-12-01 ENCOUNTER — Encounter: Payer: Self-pay | Admitting: Family Medicine

## 2015-12-01 ENCOUNTER — Ambulatory Visit (INDEPENDENT_AMBULATORY_CARE_PROVIDER_SITE_OTHER): Payer: Medicare Other | Admitting: Family Medicine

## 2015-12-01 VITALS — BP 108/72 | HR 79 | Temp 98.2°F | Ht 70.0 in | Wt 138.4 lb

## 2015-12-01 DIAGNOSIS — R319 Hematuria, unspecified: Secondary | ICD-10-CM

## 2015-12-01 DIAGNOSIS — E113499 Type 2 diabetes mellitus with severe nonproliferative diabetic retinopathy without macular edema, unspecified eye: Secondary | ICD-10-CM

## 2015-12-01 DIAGNOSIS — K219 Gastro-esophageal reflux disease without esophagitis: Secondary | ICD-10-CM

## 2015-12-01 DIAGNOSIS — R634 Abnormal weight loss: Secondary | ICD-10-CM

## 2015-12-01 DIAGNOSIS — I10 Essential (primary) hypertension: Secondary | ICD-10-CM

## 2015-12-01 DIAGNOSIS — R109 Unspecified abdominal pain: Secondary | ICD-10-CM | POA: Diagnosis not present

## 2015-12-01 HISTORY — DX: Unspecified abdominal pain: R10.9

## 2015-12-01 NOTE — Progress Notes (Signed)
Pre visit review using our clinic review tool, if applicable. No additional management support is needed unless otherwise documented below in the visit note. 

## 2015-12-01 NOTE — Patient Instructions (Addendum)
Start NOW probiotic 10 strain caps 1 cap daily x 90 days Carnation Instant breakfast or Boost twice daily x 90 days  Cholelithiasis Cholelithiasis (also called gallstones) is a form of gallbladder disease in which gallstones form in your gallbladder. The gallbladder is an organ that stores bile made in the liver, which helps digest fats. Gallstones begin as small crystals and slowly grow into stones. Gallstone pain occurs when the gallbladder spasms and a gallstone is blocking the duct. Pain can also occur when a stone passes out of the duct.  RISK FACTORS  Being male.   Having multiple pregnancies. Health care providers sometimes advise removing diseased gallbladders before future pregnancies.   Being obese.  Eating a diet heavy in fried foods and fat.   Being older than 50 years and increasing age.   Prolonged use of medicines containing male hormones.   Having diabetes mellitus.   Rapidly losing weight.   Having a family history of gallstones (heredity).  SYMPTOMS  Nausea.   Vomiting.  Abdominal pain.   Yellowing of the skin (jaundice).   Sudden pain. It may persist from several minutes to several hours.  Fever.   Tenderness to the touch. In some cases, when gallstones do not move into the bile duct, people have no pain or symptoms. These are called "silent" gallstones.  TREATMENT Silent gallstones do not need treatment. In severe cases, emergency surgery may be required. Options for treatment include:  Surgery to remove the gallbladder. This is the most common treatment.  Medicines. These do not always work and may take 6-12 months or more to work.  Shock wave treatment (extracorporeal biliary lithotripsy). In this treatment an ultrasound machine sends shock waves to the gallbladder to break gallstones into smaller pieces that can pass into the intestines or be dissolved by medicine. HOME CARE INSTRUCTIONS   Only take over-the-counter or  prescription medicines for pain, discomfort, or fever as directed by your health care provider.   Follow a low-fat diet until seen again by your health care provider. Fat causes the gallbladder to contract, which can result in pain.   Follow up with your health care provider as directed. Attacks are almost always recurrent and surgery is usually required for permanent treatment.  SEEK IMMEDIATE MEDICAL CARE IF:   Your pain increases and is not controlled by medicines.   You have a fever or persistent symptoms for more than 2-3 days.   You have a fever and your symptoms suddenly get worse.   You have persistent nausea and vomiting.  MAKE SURE YOU:   Understand these instructions.  Will watch your condition.  Will get help right away if you are not doing well or get worse.   This information is not intended to replace advice given to you by your health care provider. Make sure you discuss any questions you have with your health care provider.   Document Released: 05/16/2005 Document Revised: 01/20/2013 Document Reviewed: 11/11/2012 Elsevier Interactive Patient Education [employer].

## 2015-12-02 LAB — C-REACTIVE PROTEIN: CRP: <0.5 mg/dL (ref <0.60)

## 2015-12-02 LAB — CBC WITH DIFFERENTIAL/PLATELET
BASOS PCT: 2 %
Basophils Absolute: 106 cells/uL (ref 0–200)
EOS ABS: 530 {cells}/uL — AB (ref 15–500)
EOS PCT: 10 %
HCT: 46.2 % (ref 38.5–50.0)
Hemoglobin: 15.5 g/dL (ref 13.2–17.1)
Lymphocytes Relative: 28 %
Lymphs Abs: 1484 cells/uL (ref 850–3900)
MCH: 29.6 pg (ref 27.0–33.0)
MCHC: 33.5 g/dL (ref 32.0–36.0)
MCV: 88.2 fL (ref 80.0–100.0)
MPV: 10.9 fL (ref 7.5–12.5)
Monocytes Absolute: 477 cells/uL (ref 200–950)
Monocytes Relative: 9 %
NEUTROS ABS: 2703 {cells}/uL (ref 1500–7800)
Neutrophils Relative %: 51 %
PLATELETS: 345 10*3/uL (ref 140–400)
RBC: 5.24 MIL/uL (ref 4.20–5.80)
RDW: 13.3 % (ref 11.0–15.0)
WBC: 5.3 10*3/uL (ref 3.8–10.8)

## 2015-12-02 LAB — COMPREHENSIVE METABOLIC PANEL
ALBUMIN: 4.8 g/dL (ref 3.6–5.1)
ALK PHOS: 50 U/L (ref 40–115)
ALT: 8 U/L — ABNORMAL LOW (ref 9–46)
AST: 11 U/L (ref 10–40)
BILIRUBIN TOTAL: 1.6 mg/dL — AB (ref 0.2–1.2)
BUN: 9 mg/dL (ref 7–25)
CALCIUM: 10.3 mg/dL (ref 8.6–10.3)
CO2: 28 mmol/L (ref 20–31)
Chloride: 101 mmol/L (ref 98–110)
Creat: 0.82 mg/dL (ref 0.60–1.35)
Glucose, Bld: 142 mg/dL — ABNORMAL HIGH (ref 65–99)
Potassium: 4.8 mmol/L (ref 3.5–5.3)
Sodium: 140 mmol/L (ref 135–146)
Total Protein: 7.5 g/dL (ref 6.1–8.1)

## 2015-12-02 LAB — T4, FREE: Free T4: 1.6 ng/dL (ref 0.8–1.8)

## 2015-12-02 LAB — AMYLASE: AMYLASE: 59 U/L (ref 0–105)

## 2015-12-02 LAB — LIPASE: LIPASE: 49 U/L (ref 7–60)

## 2015-12-02 LAB — SEDIMENTATION RATE: SED RATE: 1 mm/h (ref 0–15)

## 2015-12-06 ENCOUNTER — Encounter: Payer: Self-pay | Admitting: Family Medicine

## 2015-12-06 DIAGNOSIS — R634 Abnormal weight loss: Secondary | ICD-10-CM

## 2015-12-06 HISTORY — DX: Abnormal weight loss: R63.4

## 2015-12-06 NOTE — Assessment & Plan Note (Signed)
Check amylase, lipase, cmp etc and work on moving bowels daily. Add a probiotic and fiber supplements.

## 2015-12-06 NOTE — Assessment & Plan Note (Signed)
Is active and eats well. Will check labs and they are encouraged to add protein supplements 2-3 x a day

## 2015-12-06 NOTE — Assessment & Plan Note (Signed)
hgba1c acceptable, minimize simple carbs. Increase exercise as tolerated. Continue current meds 

## 2015-12-06 NOTE — Assessment & Plan Note (Signed)
Well controlled, no changes to meds. Encouraged heart healthy diet such as the DASH diet and exercise as tolerated.  °

## 2015-12-06 NOTE — Assessment & Plan Note (Signed)
Avoid offending foods, start probiotics. Do not eat large meals in late evening and consider raising head of bed. Check H Pylori

## 2015-12-06 NOTE — Assessment & Plan Note (Signed)
No urinary symptoms. Check UA today

## 2015-12-06 NOTE — Progress Notes (Signed)
Patient ID: Adam Fitzpatrick, male   DOB: 13-Aug-1980, 35 y.o.   MRN: [id number]   Subjective:    Patient ID: Adam Fitzpatrick, male    DOB: 1980-07-31, 35 y.o.   MRN: [id number]  Chief Complaint  Patient presents with  . Follow-up    labs    HPI Patient is in today for evaluation of weight loss. He is brought in today by his mother. He has been doing well and eating a healthy diet but has been loosing weight. Notes some intermittent abdominal pain, notes some heartburn, nausea and mild constipation. No bloody or tarry stool. No fevers or other acute concerns. Denies CP/palp/SOB/HA/congestion/fevers or GU c/o. Taking meds as prescribed  Past Medical History  Diagnosis Date  . MR (mental retardation), moderate   . Diabetes mellitus   . Asthma   . GERD (gastroesophageal reflux disease)   . Hyperlipidemia     notes only hx of hyperlipidemia  . Blood in urine   . MRSA (methicillin resistant staph aureus) culture positive   . Routine general medical examination at a health care facility 10/15/2012  . Abdominal pain 12/01/2015  . Loss of weight 12/06/2015    Past Surgical History  Procedure Laterality Date  . Intramedullary (im) nail intertrochanteric Left 04/10/2014  . Intramedullary (im) nail intertrochanteric Left 04/10/2014    Procedure: INTRAMEDULLARY (IM) NAIL INTERTROCHANTRIC LEFT HIP;  Surgeon: Renette Butters, MD;  Location: [facility];  Service: Orthopedics;  Laterality: Left;  . Hip surgery      Left    Family History  Problem Relation Age of Onset  . Alcohol abuse Father   . Cancer Maternal Grandmother   . Hyperlipidemia Maternal Grandmother   . Heart disease Maternal Grandmother   . Hypertension Maternal Grandmother   . Heart disease Maternal Grandfather   . Hypertension Maternal Grandfather   . Hyperlipidemia Maternal Grandfather   . Hyperlipidemia Paternal Grandmother   . Heart disease Paternal Grandmother   . Hypertension Paternal Grandmother   . Kidney disease Paternal  Grandmother   . Diabetes Paternal Grandmother   . Heart disease Paternal Grandfather   . Hypertension Paternal Grandfather   . Hyperlipidemia Paternal Grandfather     Social History   Social History  . Marital Status: Single    Spouse Name: N/A  . Number of Children: N/A  . Years of Education: N/A   Occupational History  . Not on file.   Social History Main Topics  . Smoking status: Never Smoker   . Smokeless tobacco: Never Used  . Alcohol Use: No  . Drug Use: No  . Sexual Activity: No   Other Topics Concern  . Not on file   Social History Narrative    Outpatient Prescriptions Prior to Visit  Medication Sig Dispense Refill  . acetaminophen (TYLENOL) 500 MG tablet Take 500 mg by mouth every 4 (four) hours as needed (pain; fever >100).     Marland Kitchen albuterol (PROVENTIL HFA;VENTOLIN HFA) 108 (90 BASE) MCG/ACT inhaler Inhale 2 puffs into the lungs 4 (four) times daily as needed for wheezing or shortness of breath. Shortness of breath/wheezing    . Calcium Carbonate (CALCIUM 600 PO) Take by mouth 2 (two) times daily.    . Cholecalciferol (VITAMIN D) 2000 units CAPS Take by mouth.    . clotrimazole (LOTRIMIN) 1 % cream APPLY AS DIRECTED 2 TIMES DAILY  FOR 2 TO 4 WEEKS UNTIL RASH CLEARS. USE FOR 2 DAYS AFTER RASH IS CLEARED. Medicaiton is only  to be used as needed. 30 g 0  . diphenhydrAMINE (BENADRYL) 25 MG tablet Take 25-50 mg by mouth every 6 (six) hours as needed (allergic reaction). Take 2 tablets (50 mg) for ingestion of shellfish and seek emergency help.    . docusate sodium (COLACE) 100 MG capsule Take 1 capsule (100 mg total) by mouth 2 (two) times daily. Continue this while taking narcotics to help with bowel movements (Patient taking differently: Take 100 mg by mouth 2 (two) times daily. Continue this while taking narcotics to help with bowel movements  PRN) 30 capsule 1  . fluticasone (FLONASE) 50 MCG/ACT nasal spray Place 1 spray into both nostrils daily as needed for allergies  or rhinitis (PRN).     Marland Kitchen guaiFENesin-dextromethorphan (ROBITUSSIN DM) 100-10 MG/5ML syrup Take 15 mLs by mouth every 4 (four) hours as needed for cough (cold symptoms). Reported on 08/17/2015    . ibuprofen (ADVIL,MOTRIN) 200 MG tablet Take 200 mg by mouth every 4 (four) hours as needed.    . loperamide (IMODIUM A-D) 2 MG tablet Take 2 mg by mouth as needed for diarrhea or loose stools. Take 2 caplets  or 4 tsp (20 mls) liquid by mouth after first loose stool and 1 caplet or 1 tsp (5 mls) liquid by mouth for each subsequent stool as needed; not to exceed 8 caplets or 50 mls in 24 hours    . loratadine (CLARITIN) 10 MG tablet Take 1 tablet (10 mg total) by mouth daily. 30 tablet 11  . losartan (COZAAR) 25 MG tablet TAKE 1 TABLET BY MOUTH ONCE DAILY. 31 tablet 6  . metFORMIN (GLUCOPHAGE) 1000 MG tablet TAKE 1 TABLET BY MOUTH TWICE DAILY WITH A MEAL. 60 tablet 6  . montelukast (SINGULAIR) 10 MG tablet TAKE 1 TABLET BY MOUTH ONCE DAILY. (Patient taking differently: TAKE 1 TABLET BY MOUTH ONCE DAILY. PRN) 31 tablet 6  . nystatin cream (MYCOSTATIN) Apply 1 application topically 2 (two) times daily. 30 g 0  . omeprazole (PRILOSEC) 20 MG capsule TAKE 1 CAPSULE BY MOUTH ONCE DAILY. (Patient taking differently: TAKE 1 CAPSULE BY MOUTH ONCE DAILY. PRN) 31 capsule 5  . saxagliptin HCl (ONGLYZA) 5 MG TABS tablet Take 1 tablet (5 mg total) by mouth daily. 30 tablet 3   No facility-administered medications prior to visit.    Allergies  Allergen Reactions  . Shellfish Allergy Anaphylaxis  . Iohexol     IVP Dye    Review of Systems  Constitutional: Negative for fever and malaise/fatigue.  HENT: Negative for congestion.   Eyes: Negative for blurred vision.  Respiratory: Negative for shortness of breath.   Cardiovascular: Negative for chest pain, palpitations and leg swelling.  Gastrointestinal: Positive for heartburn, nausea, abdominal pain and constipation. Negative for vomiting, diarrhea, blood in stool  and melena.  Genitourinary: Positive for frequency. Negative for dysuria, urgency and hematuria.  Musculoskeletal: Negative for falls.  Skin: Negative for rash.  Neurological: Negative for dizziness, loss of consciousness and headaches.  Endo/Heme/Allergies: Negative for environmental allergies.  Psychiatric/Behavioral: Negative for depression. The patient is not nervous/anxious.        Objective:    Physical Exam  Constitutional: He is oriented to person, place, and time. He appears well-developed and well-nourished. No distress.  HENT:  Head: Normocephalic and atraumatic.  Eyes: Conjunctivae are normal.  Neck: Neck supple. No thyromegaly present.  Cardiovascular: Normal rate, regular rhythm and normal heart sounds.   No murmur heard. Pulmonary/Chest: Effort normal and breath sounds normal. No  respiratory distress. He has no wheezes.  Abdominal: Soft. Bowel sounds are normal. He exhibits no mass. There is no tenderness.  Musculoskeletal: He exhibits no edema.  Lymphadenopathy:    He has no cervical adenopathy.  Neurological: He is alert and oriented to person, place, and time.  Skin: Skin is warm and dry.  Psychiatric: He has a normal mood and affect. His behavior is normal.    BP 108/72 mmHg  Pulse 79  Temp(Src) 98.2 F (36.8 C) (Other (Comment))  Ht 5' 10"  (1.778 m)  Wt 138 lb 6 oz (62.766 kg)  BMI 19.85 kg/m2  SpO2 97% Wt Readings from Last 3 Encounters:  12/01/15 138 lb 6 oz (62.766 kg)  08/17/15 145 lb 2 oz (65.828 kg)  06/09/15 143 lb (64.864 kg)     Lab Results  Component Value Date   WBC 5.3 12/01/2015   HGB 15.5 12/01/2015   HCT 46.2 12/01/2015   PLT 345 12/01/2015   GLUCOSE 142* 12/01/2015   CHOL 140 11/17/2015   TRIG 55.0 11/17/2015   HDL 48.40 11/17/2015   LDLCALC 81 11/17/2015   ALT 8* 12/01/2015   AST 11 12/01/2015   NA 140 12/01/2015   K 4.8 12/01/2015   CL 101 12/01/2015   CREATININE 0.82 12/01/2015   BUN 9 12/01/2015   CO2 28 12/01/2015    TSH 0.96 11/17/2015   HGBA1C 7.4* 11/17/2015   MICROALBUR 2.3* 10/23/2011    Lab Results  Component Value Date   TSH 0.96 11/17/2015   Lab Results  Component Value Date   WBC 5.3 12/01/2015   HGB 15.5 12/01/2015   HCT 46.2 12/01/2015   MCV 88.2 12/01/2015   PLT 345 12/01/2015   Lab Results  Component Value Date   NA 140 12/01/2015   K 4.8 12/01/2015   CO2 28 12/01/2015   GLUCOSE 142* 12/01/2015   BUN 9 12/01/2015   CREATININE 0.82 12/01/2015   BILITOT 1.6* 12/01/2015   ALKPHOS 50 12/01/2015   AST 11 12/01/2015   ALT 8* 12/01/2015   PROT 7.5 12/01/2015   ALBUMIN 4.8 12/01/2015   CALCIUM 10.3 12/01/2015   ANIONGAP 13 04/11/2014   GFR 124.83 11/17/2015   Lab Results  Component Value Date   CHOL 140 11/17/2015   Lab Results  Component Value Date   HDL 48.40 11/17/2015   Lab Results  Component Value Date   LDLCALC 81 11/17/2015   Lab Results  Component Value Date   TRIG 55.0 11/17/2015   Lab Results  Component Value Date   CHOLHDL 3 11/17/2015   Lab Results  Component Value Date   HGBA1C 7.4* 11/17/2015       Assessment & Plan:   Problem List Items Addressed This Visit    Hematuria    No urinary symptoms. Check UA today      Relevant Orders   C-reactive protein (Completed)   T4, free (Completed)   Comprehensive metabolic panel (Completed)   CBC w/Diff (Completed)   Amylase (Completed)   Lipase (Completed)   Sed Rate (ESR) (Completed)   Urinalysis   Urine Culture   DM type 2 (diabetes mellitus, type 2) ([place]) - Primary    hgba1c acceptable, minimize simple carbs. Increase exercise as tolerated. Continue current meds      Relevant Orders   C-reactive protein (Completed)   T4, free (Completed)   Comprehensive metabolic panel (Completed)   CBC w/Diff (Completed)   Amylase (Completed)   Lipase (Completed)   Sed Rate (  ESR) (Completed)   GERD (gastroesophageal reflux disease)    Avoid offending foods, start probiotics. Do not eat large  meals in late evening and consider raising head of bed. Check H Pylori      Hypertension    Well controlled, no changes to meds. Encouraged heart healthy diet such as the DASH diet and exercise as tolerated.       Relevant Orders   C-reactive protein (Completed)   T4, free (Completed)   Comprehensive metabolic panel (Completed)   CBC w/Diff (Completed)   Amylase (Completed)   Lipase (Completed)   Sed Rate (ESR) (Completed)   Abdominal pain    Check amylase, lipase, cmp etc and work on moving bowels daily. Add a probiotic and fiber supplements.       Relevant Orders   US Abdomen Complete   C-reactive protein (Completed)   T4, free (Completed)   Comprehensive metabolic panel (Completed)   CBC w/Diff (Completed)   Amylase (Completed)   Lipase (Completed)   Sed Rate (ESR) (Completed)   Urinalysis   Urine Culture   Loss of weight    Is active and eats well. Will check labs and they are encouraged to add protein supplements 2-3 x a day         I am having Mr. Abercrombie maintain his albuterol, omeprazole, guaiFENesin-dextromethorphan, acetaminophen, loperamide, loratadine, fluticasone, docusate sodium, diphenhydrAMINE, clotrimazole, metFORMIN, montelukast, losartan, nystatin cream, saxagliptin HCl, ibuprofen, Calcium Carbonate (CALCIUM 600 PO), and Vitamin D.  No orders of the defined types were placed in this encounter.     Penni Homans, MD

## 2015-12-11 ENCOUNTER — Encounter: Payer: Self-pay | Admitting: Family Medicine

## 2015-12-12 ENCOUNTER — Ambulatory Visit (HOSPITAL_BASED_OUTPATIENT_CLINIC_OR_DEPARTMENT_OTHER)
Admission: RE | Admit: 2015-12-12 | Discharge: 2015-12-12 | Disposition: A | Discharge status: Home / Self Care | Payer: Medicare Other | Source: Ambulatory Visit | Attending: Family Medicine | Admitting: Family Medicine

## 2015-12-12 ENCOUNTER — Other Ambulatory Visit (INDEPENDENT_AMBULATORY_CARE_PROVIDER_SITE_OTHER): Payer: Medicare Other

## 2015-12-12 DIAGNOSIS — R109 Unspecified abdominal pain: Secondary | ICD-10-CM

## 2015-12-12 DIAGNOSIS — R319 Hematuria, unspecified: Secondary | ICD-10-CM | POA: Diagnosis not present

## 2015-12-12 DIAGNOSIS — I10 Essential (primary) hypertension: Secondary | ICD-10-CM | POA: Diagnosis not present

## 2015-12-12 DIAGNOSIS — K8020 Calculus of gallbladder without cholecystitis without obstruction: Secondary | ICD-10-CM | POA: Diagnosis not present

## 2015-12-13 LAB — URINALYSIS
Bilirubin Urine: NEGATIVE
Hgb urine dipstick: NEGATIVE
Ketones, ur: NEGATIVE
Leukocytes, UA: NEGATIVE
Nitrite: NEGATIVE
PH: 6 (ref 5.0–8.0)
SPECIFIC GRAVITY, URINE: 1.025 (ref 1.000–1.030)
TOTAL PROTEIN, URINE-UPE24: NEGATIVE
Urobilinogen, UA: 0.2 (ref 0.0–1.0)

## 2015-12-13 LAB — H. PYLORI ANTIBODY, IGG: H Pylori IgG: NEGATIVE

## 2015-12-14 ENCOUNTER — Other Ambulatory Visit: Payer: Self-pay | Admitting: Family Medicine

## 2015-12-14 ENCOUNTER — Encounter: Payer: Self-pay | Admitting: Family Medicine

## 2015-12-14 DIAGNOSIS — R81 Glycosuria: Secondary | ICD-10-CM

## 2015-12-14 DIAGNOSIS — R634 Abnormal weight loss: Secondary | ICD-10-CM

## 2015-12-14 DIAGNOSIS — K8689 Other specified diseases of pancreas: Secondary | ICD-10-CM

## 2015-12-14 DIAGNOSIS — R739 Hyperglycemia, unspecified: Secondary | ICD-10-CM

## 2015-12-14 LAB — URINE CULTURE
Colony Count: NO GROWTH
Organism ID, Bacteria: NO GROWTH

## 2015-12-15 ENCOUNTER — Encounter: Payer: Self-pay | Admitting: Family Medicine

## 2015-12-15 ENCOUNTER — Other Ambulatory Visit: Payer: Self-pay | Admitting: Family Medicine

## 2015-12-15 DIAGNOSIS — L309 Dermatitis, unspecified: Secondary | ICD-10-CM

## 2015-12-15 MED ORDER — DIPHENHYDRAMINE HCL (SLEEP) 25 MG PO TABS: ORAL_TABLET | ORAL | Status: DC

## 2015-12-15 MED ORDER — PREDNISONE 50 MG PO TABS: ORAL_TABLET | ORAL | Status: DC

## 2015-12-18 ENCOUNTER — Other Ambulatory Visit: Payer: Medicare Other

## 2015-12-26 ENCOUNTER — Ambulatory Visit (HOSPITAL_COMMUNITY): Payer: Medicare Other

## 2015-12-26 ENCOUNTER — Ambulatory Visit (HOSPITAL_COMMUNITY)
Admission: RE | Admit: 2015-12-26 | Discharge: 2015-12-26 | Disposition: A | Discharge status: Home / Self Care | Payer: Medicare Other | Source: Ambulatory Visit | Attending: Family Medicine | Admitting: Family Medicine

## 2015-12-26 DIAGNOSIS — R634 Abnormal weight loss: Secondary | ICD-10-CM | POA: Diagnosis not present

## 2015-12-26 DIAGNOSIS — K869 Disease of pancreas, unspecified: Secondary | ICD-10-CM | POA: Diagnosis not present

## 2015-12-26 DIAGNOSIS — K8689 Other specified diseases of pancreas: Secondary | ICD-10-CM

## 2015-12-26 DIAGNOSIS — D1803 Hemangioma of intra-abdominal structures: Secondary | ICD-10-CM | POA: Diagnosis not present

## 2015-12-26 MED ORDER — GADOBENATE DIMEGLUMINE 529 MG/ML IV SOLN
12.0000 mL | Freq: Once | INTRAVENOUS | Status: AC | PRN
  Administered 2015-12-26: 12 mL via INTRAVENOUS

## 2016-01-10 ENCOUNTER — Encounter: Payer: Self-pay | Admitting: Family Medicine

## 2016-01-16 ENCOUNTER — Encounter: Payer: Self-pay | Admitting: Family Medicine

## 2016-01-16 ENCOUNTER — Ambulatory Visit (INDEPENDENT_AMBULATORY_CARE_PROVIDER_SITE_OTHER): Payer: Medicare Other | Admitting: Family Medicine

## 2016-01-16 DIAGNOSIS — R634 Abnormal weight loss: Secondary | ICD-10-CM | POA: Diagnosis not present

## 2016-01-16 DIAGNOSIS — K219 Gastro-esophageal reflux disease without esophagitis: Secondary | ICD-10-CM

## 2016-01-16 DIAGNOSIS — I10 Essential (primary) hypertension: Secondary | ICD-10-CM

## 2016-01-16 DIAGNOSIS — B354 Tinea corporis: Secondary | ICD-10-CM

## 2016-01-16 DIAGNOSIS — E113499 Type 2 diabetes mellitus with severe nonproliferative diabetic retinopathy without macular edema, unspecified eye: Secondary | ICD-10-CM

## 2016-01-16 NOTE — Progress Notes (Signed)
Pre visit review using our clinic review tool, if applicable. No additional management support is needed unless otherwise documented below in the visit note. 

## 2016-01-16 NOTE — Patient Instructions (Signed)
   Wipe the feet down with [employer]	[place] Astringent daily to twice daily as needed for rash or itching.  Add low carbohydrate meal supplements twice daily Continue probiotic daily for 60 days   Body Ringworm Ringworm (tinea corporis) is a fungal infection of the skin on the body. This infection is not caused by worms, but is actually caused by a fungus. Fungus normally lives on the top of your skin and can be useful. However, in the case of ringworms, the fungus grows out of control and causes a skin infection. It can involve any area of skin on the body and can spread easily from one person to another (contagious). Ringworm is a common problem for children, but it can affect adults as well. Ringworm is also often found in athletes, especially wrestlers who share equipment and mats.  CAUSES  Ringworm of the body is caused by a fungus called dermatophyte. It can spread by:  Touchingother people who are infected.  Touchinginfected pets.  Touching or sharingobjects that have been in contact with the infected person or pet (hats, combs, towels, clothing, sports equipment). SYMPTOMS   Itchy, raised red spots and bumps on the skin.  Ring-shaped rash.  Redness near the border of the rash with a clear center.  Dry and scaly skin on or around the rash. Not every person develops a ring-shaped rash. Some develop only the red, scaly patches. DIAGNOSIS  Most often, ringworm can be diagnosed by performing a skin exam. Your caregiver may choose to take a skin scraping from the affected area. The sample will be examined under the microscope to see if the fungus is present.  TREATMENT  Body ringworm may be treated with a topical antifungal cream or ointment. Sometimes, an antifungal shampoo that can be used on your body is prescribed. You may be prescribed antifungal medicines to take by mouth if your ringworm is severe, keeps coming back, or lasts a long time.  HOME CARE INSTRUCTIONS   Only take  over-the-counter or prescription medicines as directed by your caregiver.  Wash the infected area and dry it completely before applying yourcream or ointment.  When using antifungal shampoo to treat the ringworm, leave the shampoo on the body for 3-5 minutes before rinsing.   Wear loose clothing to stop clothes from rubbing and irritating the rash.  Wash or change your bed sheets every night while you have the rash.  Have your pet treated by your veterinarian if it has the same infection. To prevent ringworm:   Practice good hygiene.  Wear sandals or shoes in public places and showers.  Do not share personal items with others.  Avoid touching red patches of skin on other people.  Avoid touching pets that have bald spots or wash your hands after doing so. SEEK MEDICAL CARE IF:   Your rash continues to spread after 7 days of treatment.  Your rash is not gone in 4 weeks.  The area around your rash becomes red, warm, tender, and swollen.   This information is not intended to replace advice given to you by your health care provider. Make sure you discuss any questions you have with your health care provider.   Document Released: 05/17/2000 Document Revised: 02/12/2012 Document Reviewed: 12/02/2011 Elsevier Interactive Patient Education [employer].

## 2016-01-22 ENCOUNTER — Other Ambulatory Visit: Payer: Self-pay | Admitting: Family Medicine

## 2016-01-22 ENCOUNTER — Encounter: Payer: Self-pay | Admitting: Family Medicine

## 2016-01-22 ENCOUNTER — Telehealth: Payer: Self-pay | Admitting: Family Medicine

## 2016-01-22 MED ORDER — OMEPRAZOLE 20 MG PO CPDR: 20.0000 mg | DELAYED_RELEASE_CAPSULE | Freq: Every day | ORAL | 8 refills | Status: DC

## 2016-01-22 NOTE — Telephone Encounter (Signed)
Caller name: Patsy Relationship to patient:  Can be reached: [phone]  Pharmacy:  [facility] [address] [place] [phone] (Phone) [phone] (Fax)     Reason for call: Request call back to discuss medications. Need order to resume omeprazole (PRILOSEC) 20 MG capsule [id number]

## 2016-01-22 NOTE — Telephone Encounter (Signed)
Refill done.  mychart messsage to inform to caregiver

## 2016-01-22 NOTE — Telephone Encounter (Signed)
Called left message to call back 

## 2016-01-28 NOTE — Assessment & Plan Note (Signed)
Avoid offending foods, start probiotics. Do not eat large meals in late evening and consider raising head of bed.  

## 2016-01-28 NOTE — Assessment & Plan Note (Signed)
stabilized with meal supplements and treatment of acid, no further changes at this time. H pylori negative. Imaging negative for any acute concerns.

## 2016-01-28 NOTE — Progress Notes (Signed)
Patient ID: Adam Fitzpatrick, male   DOB: 03/04/1981, 35 y.o.   MRN: [id number]   Subjective:    Patient ID: Adam Fitzpatrick, male    DOB: [birth year], 35 y.o.   MRN: [id number]  Chief Complaint  Patient presents with  . Follow-up    patient reports legs and feet have rash and are itchy for about 2 wks.  . Abdominal Pain    follow up  . Weight Loss    follow up    HPI Patient is in today for follow up. Accompanied by his aunt who is his guardian. He lives in a group home and they note he is eating better and doing better at home. He was enjoying carnation instant breakfast but his sugars spiked up. No polyuria or polydipsia. Is noting an itchy rash on legs. Denies CP/palp/SOB/HA/congestion/fevers or GU c/o. Taking meds as prescribed  Past Medical History:  Diagnosis Date  . Abdominal pain 12/01/2015  . Asthma   . Blood in urine   . Diabetes mellitus   . GERD (gastroesophageal reflux disease)   . Hyperlipidemia    notes only hx of hyperlipidemia  . Loss of weight 12/06/2015  . MR (mental retardation), moderate   . MRSA (methicillin resistant staph aureus) culture positive   . Routine general medical examination at a health care facility 10/15/2012    Past Surgical History:  Procedure Laterality Date  . HIP SURGERY     Left  . INTRAMEDULLARY (IM) NAIL INTERTROCHANTERIC Left 04/10/2014  . INTRAMEDULLARY (IM) NAIL INTERTROCHANTERIC Left 04/10/2014   Procedure: INTRAMEDULLARY (IM) NAIL INTERTROCHANTRIC LEFT HIP;  Surgeon: Renette Butters, MD;  Location: [facility];  Service: Orthopedics;  Laterality: Left;    Family History  Problem Relation Age of Onset  . Alcohol abuse Father   . Cancer Maternal Grandmother   . Hyperlipidemia Maternal Grandmother   . Heart disease Maternal Grandmother   . Hypertension Maternal Grandmother   . Heart disease Maternal Grandfather   . Hypertension Maternal Grandfather   . Hyperlipidemia Maternal Grandfather   . Hyperlipidemia Paternal Grandmother   .  Heart disease Paternal Grandmother   . Hypertension Paternal Grandmother   . Kidney disease Paternal Grandmother   . Diabetes Paternal Grandmother   . Heart disease Paternal Grandfather   . Hypertension Paternal Grandfather   . Hyperlipidemia Paternal Grandfather     Social History   Social History  . Marital status: Single    Spouse name: N/A  . Number of children: N/A  . Years of education: N/A   Occupational History  . Not on file.   Social History Main Topics  . Smoking status: Never Smoker  . Smokeless tobacco: Never Used  . Alcohol use No  . Drug use: No  . Sexual activity: No   Other Topics Concern  . Not on file   Social History Narrative  . No narrative on file    Outpatient Medications Prior to Visit  Medication Sig Dispense Refill  . acetaminophen (TYLENOL) 500 MG tablet Take 500 mg by mouth every 4 (four) hours as needed (pain; fever >100).     Marland Kitchen albuterol (PROVENTIL HFA;VENTOLIN HFA) 108 (90 BASE) MCG/ACT inhaler Inhale 2 puffs into the lungs 4 (four) times daily as needed for wheezing or shortness of breath. Shortness of breath/wheezing    . Calcium Carbonate (CALCIUM 600 PO) Take by mouth 2 (two) times daily.    . Cholecalciferol (VITAMIN D) 2000 units CAPS Take by mouth.    Marland Kitchen  clotrimazole (LOTRIMIN) 1 % cream APPLY AS DIRECTED 2 TIMES DAILY  FOR 2 TO 4 WEEKS UNTIL RASH CLEARS. USE FOR 2 DAYS AFTER RASH IS CLEARED. Medicaiton is only to be used as needed. 30 g 0  . diphenhydrAMINE (BENADRYL) 25 MG tablet Take 25-50 mg by mouth every 6 (six) hours as needed (allergic reaction). Take 2 tablets (50 mg) for ingestion of shellfish and seek emergency help.    . diphenhydrAMINE (SOMINEX) 25 MG tablet Take 2 tablets 1 hour prior to study 2 tablet 0  . docusate sodium (COLACE) 100 MG capsule Take 1 capsule (100 mg total) by mouth 2 (two) times daily. Continue this while taking narcotics to help with bowel movements (Patient taking differently: Take 100 mg by mouth 2  (two) times daily. Continue this while taking narcotics to help with bowel movements  PRN) 30 capsule 1  . fluticasone (FLONASE) 50 MCG/ACT nasal spray Place 1 spray into both nostrils daily as needed for allergies or rhinitis (PRN).     Marland Kitchen guaiFENesin-dextromethorphan (ROBITUSSIN DM) 100-10 MG/5ML syrup Take 15 mLs by mouth every 4 (four) hours as needed for cough (cold symptoms). Reported on 08/17/2015    . ibuprofen (ADVIL,MOTRIN) 200 MG tablet Take 200 mg by mouth every 4 (four) hours as needed.    . loperamide (IMODIUM A-D) 2 MG tablet Take 2 mg by mouth as needed for diarrhea or loose stools. Take 2 caplets  or 4 tsp (20 mls) liquid by mouth after first loose stool and 1 caplet or 1 tsp (5 mls) liquid by mouth for each subsequent stool as needed; not to exceed 8 caplets or 50 mls in 24 hours    . loratadine (CLARITIN) 10 MG tablet Take 1 tablet (10 mg total) by mouth daily. 30 tablet 11  . losartan (COZAAR) 25 MG tablet TAKE 1 TABLET BY MOUTH ONCE DAILY. 31 tablet 6  . metFORMIN (GLUCOPHAGE) 1000 MG tablet TAKE 1 TABLET BY MOUTH TWICE DAILY WITH A MEAL. 60 tablet 6  . montelukast (SINGULAIR) 10 MG tablet TAKE 1 TABLET BY MOUTH ONCE DAILY. (Patient taking differently: TAKE 1 TABLET BY MOUTH ONCE DAILY. PRN) 31 tablet 6  . nystatin cream (MYCOSTATIN) Apply 1 application topically 2 (two) times daily. 30 g 0  . saxagliptin HCl (ONGLYZA) 5 MG TABS tablet Take 1 tablet (5 mg total) by mouth daily. 30 tablet 3  . omeprazole (PRILOSEC) 20 MG capsule TAKE 1 CAPSULE BY MOUTH ONCE DAILY. (Patient taking differently: TAKE 1 CAPSULE BY MOUTH ONCE DAILY. PRN) 31 capsule 5  . predniSONE (DELTASONE) 50 MG tablet Take 1 tablet by mouth 13 hours prior to study, then 1 by mouth 7 hours prior, then 1 by mouth 1 hour prior to study. 3 tablet 0   No facility-administered medications prior to visit.     Allergies  Allergen Reactions  . Shellfish Allergy Anaphylaxis  . Iohexol     IVP Dye    Review of Systems    Constitutional: Negative for fever and malaise/fatigue.  HENT: Negative for congestion.   Eyes: Negative for blurred vision.  Respiratory: Negative for shortness of breath.   Cardiovascular: Negative for chest pain, palpitations and leg swelling.  Gastrointestinal: Positive for heartburn. Negative for abdominal pain, blood in stool and nausea.  Genitourinary: Negative for dysuria and frequency.  Musculoskeletal: Negative for falls.  Skin: Positive for itching and rash.  Neurological: Negative for dizziness, loss of consciousness and headaches.  Endo/Heme/Allergies: Negative for environmental allergies.  Psychiatric/Behavioral:  Negative for depression. The patient is not nervous/anxious.        Objective:    Physical Exam  Skin: Rash noted.  Erythematous patches on both lower extremities.     BP 102/68 (BP Location: Right Arm, Patient Position: Sitting, Cuff Size: Normal)   Pulse 81   Temp 98.2 F (36.8 C) (Oral)   Resp 17   Ht 6' (1.829 m)   Wt 141 lb 12.8 oz (64.3 kg)   SpO2 98%   BMI 19.23 kg/m  Wt Readings from Last 3 Encounters:  01/16/16 141 lb 12.8 oz (64.3 kg)  12/01/15 138 lb 6 oz (62.8 kg)  08/17/15 145 lb 2 oz (65.8 kg)     Lab Results  Component Value Date   WBC 5.3 12/01/2015   HGB 15.5 12/01/2015   HCT 46.2 12/01/2015   PLT 345 12/01/2015   GLUCOSE 142 (H) 12/01/2015   CHOL 140 11/17/2015   TRIG 55.0 11/17/2015   HDL 48.40 11/17/2015   LDLCALC 81 11/17/2015   ALT 8 (L) 12/01/2015   AST 11 12/01/2015   NA 140 12/01/2015   K 4.8 12/01/2015   CL 101 12/01/2015   CREATININE 0.82 12/01/2015   BUN 9 12/01/2015   CO2 28 12/01/2015   TSH 0.96 11/17/2015   HGBA1C 7.4 (H) 11/17/2015   MICROALBUR 2.3 (H) 10/23/2011    Lab Results  Component Value Date   TSH 0.96 11/17/2015   Lab Results  Component Value Date   WBC 5.3 12/01/2015   HGB 15.5 12/01/2015   HCT 46.2 12/01/2015   MCV 88.2 12/01/2015   PLT 345 12/01/2015   Lab Results   Component Value Date   NA 140 12/01/2015   K 4.8 12/01/2015   CO2 28 12/01/2015   GLUCOSE 142 (H) 12/01/2015   BUN 9 12/01/2015   CREATININE 0.82 12/01/2015   BILITOT 1.6 (H) 12/01/2015   ALKPHOS 50 12/01/2015   AST 11 12/01/2015   ALT 8 (L) 12/01/2015   PROT 7.5 12/01/2015   ALBUMIN 4.8 12/01/2015   CALCIUM 10.3 12/01/2015   ANIONGAP 13 04/11/2014   GFR 124.83 11/17/2015   Lab Results  Component Value Date   CHOL 140 11/17/2015   Lab Results  Component Value Date   HDL 48.40 11/17/2015   Lab Results  Component Value Date   LDLCALC 81 11/17/2015   Lab Results  Component Value Date   TRIG 55.0 11/17/2015   Lab Results  Component Value Date   CHOLHDL 3 11/17/2015   Lab Results  Component Value Date   HGBA1C 7.4 (H) 11/17/2015       Assessment & Plan:   Problem List Items Addressed This Visit    DM type 2 (diabetes mellitus, type 2) ([place])    hgba1c acceptable, minimize simple carbs. Increase exercise as tolerated. Continue current meds      GERD (gastroesophageal reflux disease)    Avoid offending foods, start probiotics. Do not eat large meals in late evening and consider raising head of bed.       Tinea corporis    Clotrimazole cream bid.      Hypertension    Well controlled, no changes to meds. Encouraged heart healthy diet such as the DASH diet and exercise as tolerated.       Loss of weight    stabilized with meal supplements and treatment of acid, no further changes at this time. H pylori negative. Imaging negative for any acute concerns.  Other Visit Diagnoses   None.     I have discontinued Mr. Rueb predniSONE. I am also having him maintain his albuterol, guaiFENesin-dextromethorphan, acetaminophen, loperamide, loratadine, fluticasone, docusate sodium, diphenhydrAMINE, clotrimazole, metFORMIN, montelukast, losartan, nystatin cream, saxagliptin HCl, ibuprofen, Calcium Carbonate (CALCIUM 600 PO), Vitamin D, and  diphenhydrAMINE.  No orders of the defined types were placed in this encounter.    Penni Homans, MD

## 2016-01-28 NOTE — Assessment & Plan Note (Signed)
Clotrimazole cream bid.

## 2016-01-28 NOTE — Assessment & Plan Note (Signed)
hgba1c acceptable, minimize simple carbs. Increase exercise as tolerated. Continue current meds 

## 2016-01-28 NOTE — Assessment & Plan Note (Signed)
Well controlled, no changes to meds. Encouraged heart healthy diet such as the DASH diet and exercise as tolerated.  °

## 2016-02-01 DIAGNOSIS — B356 Tinea cruris: Secondary | ICD-10-CM | POA: Diagnosis not present

## 2016-03-07 DIAGNOSIS — Z23 Encounter for immunization: Secondary | ICD-10-CM | POA: Diagnosis not present

## 2016-03-12 ENCOUNTER — Encounter: Payer: Self-pay | Admitting: Medical

## 2016-03-12 ENCOUNTER — Ambulatory Visit (INDEPENDENT_AMBULATORY_CARE_PROVIDER_SITE_OTHER): Payer: Medicare Other | Admitting: Medical

## 2016-03-12 VITALS — BP 100/82 | HR 75 | Temp 98.2°F | Ht 72.0 in | Wt 140.4 lb

## 2016-03-12 DIAGNOSIS — H1033 Unspecified acute conjunctivitis, bilateral: Secondary | ICD-10-CM

## 2016-03-12 MED ORDER — LORATADINE 10 MG PO TABS: 10.0000 mg | ORAL_TABLET | Freq: Every day | ORAL | 11 refills | Status: DC

## 2016-03-12 MED ORDER — TOBRAMYCIN 0.3 % OP SOLN
1.0000 [drp] | Freq: Four times a day (QID) | OPHTHALMIC | 0 refills | Status: DC
Start: 2016-03-12 — End: 2016-08-13

## 2016-03-12 NOTE — Patient Instructions (Signed)
You appear to have bacterial conjunctivitis. I am prescribing  Tobramycin antibiotic eye drops. Your eye should gradually improve. If your eye symptoms worsen, persist or change please notify us.   For possible allergies with hx of decided to go ahead and refill you claritin as well.  Follow up in 7 days or as needed.

## 2016-03-12 NOTE — Progress Notes (Signed)
Pre visit review using our clinic tool,if applicable. No additional management support is needed unless otherwise documented below in the visit note.  

## 2016-03-12 NOTE — Progress Notes (Signed)
Subjective:    Patient ID: Adam Fitzpatrick, male    DOB: 1981/05/02, 35 y.o.   MRN: [id number]  HPI   Pt has some red eyes with itchy sensation. Some watery eyes. Started this weekend. First rt eye then went to left eye. Some matting to lids in morning on both side.  Pt lives in group home. No known pink eye in his group home. Pt does attend [employer] [facility] [employer].  No sneezing or nasal congestion. But does report his of seasonal allergies.   Review of Systems  Constitutional: Negative for chills, fatigue and fever.  HENT: Negative for congestion, ear pain, mouth sores, nosebleeds and rhinorrhea.   Eyes: Positive for redness and itching.       Matting  Respiratory: Negative for cough, chest tightness and wheezing.   Cardiovascular: Negative for chest pain and palpitations.  Gastrointestinal: Negative for abdominal pain.  Musculoskeletal: Negative for back pain.  Skin: Negative for rash.  Neurological: Negative for dizziness and headaches.  Hematological: Negative for adenopathy. Does not bruise/bleed easily.  Psychiatric/Behavioral: Negative for behavioral problems and confusion.   Past Medical History:  Diagnosis Date  . Abdominal pain 12/01/2015  . Asthma   . Blood in urine   . Diabetes mellitus   . GERD (gastroesophageal reflux disease)   . Hyperlipidemia    notes only hx of hyperlipidemia  . Loss of weight 12/06/2015  . MR (mental retardation), moderate   . MRSA (methicillin resistant staph aureus) culture positive   . Routine general medical examination at a health care facility 10/15/2012     Social History   Social History  . Marital status: Single    Spouse name: N/A  . Number of children: N/A  . Years of education: N/A   Occupational History  . Not on file.   Social History Main Topics  . Smoking status: Never Smoker  . Smokeless tobacco: Never Used  . Alcohol use No  . Drug use: No  . Sexual activity: No   Other Topics Concern  . Not on file   Social  History Narrative  . No narrative on file    Past Surgical History:  Procedure Laterality Date  . HIP SURGERY     Left  . INTRAMEDULLARY (IM) NAIL INTERTROCHANTERIC Left 04/10/2014  . INTRAMEDULLARY (IM) NAIL INTERTROCHANTERIC Left 04/10/2014   Procedure: INTRAMEDULLARY (IM) NAIL INTERTROCHANTRIC LEFT HIP;  Surgeon: Renette Butters, MD;  Location: [facility];  Service: Orthopedics;  Laterality: Left;    Family History  Problem Relation Age of Onset  . Alcohol abuse Father   . Cancer Maternal Grandmother   . Hyperlipidemia Maternal Grandmother   . Heart disease Maternal Grandmother   . Hypertension Maternal Grandmother   . Heart disease Maternal Grandfather   . Hypertension Maternal Grandfather   . Hyperlipidemia Maternal Grandfather   . Hyperlipidemia Paternal Grandmother   . Heart disease Paternal Grandmother   . Hypertension Paternal Grandmother   . Kidney disease Paternal Grandmother   . Diabetes Paternal Grandmother   . Heart disease Paternal Grandfather   . Hypertension Paternal Grandfather   . Hyperlipidemia Paternal Grandfather     Allergies  Allergen Reactions  . Shellfish Allergy Anaphylaxis  . Iohexol     IVP Dye    Current Outpatient Prescriptions on File Prior to Visit  Medication Sig Dispense Refill  . acetaminophen (TYLENOL) 500 MG tablet Take 500 mg by mouth every 4 (four) hours as needed (pain; fever >100).     Marland Kitchen  albuterol (PROVENTIL HFA;VENTOLIN HFA) 108 (90 BASE) MCG/ACT inhaler Inhale 2 puffs into the lungs 4 (four) times daily as needed for wheezing or shortness of breath. Shortness of breath/wheezing    . Calcium Carbonate (CALCIUM 600 PO) Take by mouth 2 (two) times daily.    . Cholecalciferol (VITAMIN D) 2000 units CAPS Take by mouth.    . clotrimazole (LOTRIMIN) 1 % cream APPLY AS DIRECTED 2 TIMES DAILY  FOR 2 TO 4 WEEKS UNTIL RASH CLEARS. USE FOR 2 DAYS AFTER RASH IS CLEARED. Medicaiton is only to be used as needed. 30 g 0  . diphenhydrAMINE  (BENADRYL) 25 MG tablet Take 25-50 mg by mouth every 6 (six) hours as needed (allergic reaction). Take 2 tablets (50 mg) for ingestion of shellfish and seek emergency help.    . diphenhydrAMINE (SOMINEX) 25 MG tablet Take 2 tablets 1 hour prior to study 2 tablet 0  . docusate sodium (COLACE) 100 MG capsule Take 1 capsule (100 mg total) by mouth 2 (two) times daily. Continue this while taking narcotics to help with bowel movements (Patient taking differently: Take 100 mg by mouth 2 (two) times daily. Continue this while taking narcotics to help with bowel movements  PRN) 30 capsule 1  . fluticasone (FLONASE) 50 MCG/ACT nasal spray Place 1 spray into both nostrils daily as needed for allergies or rhinitis (PRN).     Marland Kitchen guaiFENesin-dextromethorphan (ROBITUSSIN DM) 100-10 MG/5ML syrup Take 15 mLs by mouth every 4 (four) hours as needed for cough (cold symptoms). Reported on 08/17/2015    . ibuprofen (ADVIL,MOTRIN) 200 MG tablet Take 200 mg by mouth every 4 (four) hours as needed.    . loperamide (IMODIUM A-D) 2 MG tablet Take 2 mg by mouth as needed for diarrhea or loose stools. Take 2 caplets  or 4 tsp (20 mls) liquid by mouth after first loose stool and 1 caplet or 1 tsp (5 mls) liquid by mouth for each subsequent stool as needed; not to exceed 8 caplets or 50 mls in 24 hours    . losartan (COZAAR) 25 MG tablet TAKE 1 TABLET BY MOUTH ONCE DAILY. 31 tablet 6  . metFORMIN (GLUCOPHAGE) 1000 MG tablet TAKE 1 TABLET BY MOUTH TWICE DAILY WITH A MEAL. 60 tablet 6  . montelukast (SINGULAIR) 10 MG tablet TAKE 1 TABLET BY MOUTH ONCE DAILY. (Patient taking differently: TAKE 1 TABLET BY MOUTH ONCE DAILY. PRN) 31 tablet 6  . nystatin cream (MYCOSTATIN) Apply 1 application topically 2 (two) times daily. 30 g 0  . omeprazole (PRILOSEC) 20 MG capsule Take 1 capsule (20 mg total) by mouth daily. 31 capsule 8  . saxagliptin HCl (ONGLYZA) 5 MG TABS tablet Take 1 tablet (5 mg total) by mouth daily. 30 tablet 3   No current  facility-administered medications on file prior to visit.     BP 100/82   Pulse 75   Temp 98.2 F (36.8 C) (Oral)   Ht 6' (1.829 m)   Wt 140 lb 6.4 oz (63.7 kg)   SpO2 99%   BMI 19.04 kg/m      Objective:   Physical Exam  General  Mental Status - Alert. General Appearance - Well groomed. Not in acute distress.  Skin Rashes- No Rashes.  HEENT Head- Normal. Ear Auditory Canal - Left- Normal. Right - Normal.Tympanic Membrane- Left- Normal. Right- Normal. Eye Sclera/Conjunctiva- mild red and injected conjunctiva bilaterally. No dc.  Nose & Sinuses Nasal Mucosa- Left-  Boggy and Congested. Right-  Boggy  and  Congested.Bilateral no maxillary and no  frontal sinus pressure. Mouth & Throat Lips: Upper Lip- Normal: no dryness, cracking, pallor, cyanosis, or vesicular eruption. Lower Lip-Normal: no dryness, cracking, pallor, cyanosis or vesicular eruption. Buccal Mucosa- Bilateral- No Aphthous ulcers. Oropharynx- No Discharge or Erythema. Tonsils: Characteristics- Bilateral- No Erythema or Congestion. Size/Enlargement- Bilateral- No enlargement. Discharge- bilateral-None.  Neck Neck- Supple. No Masses.   Chest and Lung Exam Auscultation: Breath Sounds:-Clear even and unlabored.  Cardiovascular Auscultation:Rythm- Regular, rate and rhythm. Murmurs & Other Heart Sounds:Ausculatation of the heart reveal- No Murmurs.  Lymphatic Head & Neck General Head & Neck Lymphatics: Bilateral: Description- No Localized lymphadenopathy.       Assessment & Plan:  You appear to have bacterial conjunctivitis. I am prescribing  Tobramycin antibiotic eye drops. Your eye should gradually improve. If your eye symptoms worsen, persist or change please notify us.   For possible allergies with hx of decided to go ahead and refill you claritin as well.  Follow up in 7 days or as needed.  Krystiana Fornes, Percell Miller, PA-C

## 2016-07-02 DIAGNOSIS — E119 Type 2 diabetes mellitus without complications: Secondary | ICD-10-CM | POA: Diagnosis not present

## 2016-08-13 ENCOUNTER — Encounter: Payer: Self-pay | Admitting: Family Medicine

## 2016-08-13 ENCOUNTER — Ambulatory Visit (INDEPENDENT_AMBULATORY_CARE_PROVIDER_SITE_OTHER): Payer: Medicare Other | Admitting: Family Medicine

## 2016-08-13 VITALS — BP 98/68 | HR 93 | Temp 98.6°F | Resp 18 | Wt 136.6 lb

## 2016-08-13 DIAGNOSIS — E113499 Type 2 diabetes mellitus with severe nonproliferative diabetic retinopathy without macular edema, unspecified eye: Secondary | ICD-10-CM | POA: Diagnosis not present

## 2016-08-13 DIAGNOSIS — E559 Vitamin D deficiency, unspecified: Secondary | ICD-10-CM | POA: Diagnosis not present

## 2016-08-13 DIAGNOSIS — R739 Hyperglycemia, unspecified: Secondary | ICD-10-CM

## 2016-08-13 DIAGNOSIS — M810 Age-related osteoporosis without current pathological fracture: Secondary | ICD-10-CM

## 2016-08-13 DIAGNOSIS — R634 Abnormal weight loss: Secondary | ICD-10-CM

## 2016-08-13 DIAGNOSIS — I10 Essential (primary) hypertension: Secondary | ICD-10-CM | POA: Diagnosis not present

## 2016-08-13 DIAGNOSIS — F79 Unspecified intellectual disabilities: Secondary | ICD-10-CM

## 2016-08-13 DIAGNOSIS — R5383 Other fatigue: Secondary | ICD-10-CM | POA: Diagnosis not present

## 2016-08-13 LAB — HM DEXA SCAN

## 2016-08-13 MED ORDER — ACCU-CHEK FASTCLIX LANCETS MISC: 12 refills | Status: DC

## 2016-08-13 MED ORDER — ACCU-CHEK AVIVA PLUS VI STRP: 1.0000 | ORAL_STRIP | Freq: Two times a day (BID) | 12 refills | Status: DC

## 2016-08-13 MED ORDER — RANITIDINE HCL 300 MG PO TABS: 300.0000 mg | ORAL_TABLET | Freq: Every day | ORAL | 5 refills | Status: DC

## 2016-08-13 MED ORDER — MONTELUKAST SODIUM 10 MG PO TABS: ORAL_TABLET | ORAL | 6 refills | Status: DC

## 2016-08-13 NOTE — Assessment & Plan Note (Addendum)
hgba1c acceptable, minimize simple carbs. Increase exercise as tolerated. Continue current meds but with hgba1c increasing despite improved diet with less carbohydrates will refer to endocrinology for further consideration.

## 2016-08-13 NOTE — Progress Notes (Signed)
Pre visit review using our clinic review tool, if applicable. No additional management support is needed unless otherwise documented below in the visit note. 

## 2016-08-13 NOTE — Progress Notes (Signed)
Subjective:  I acted as a Education administrator for Dr. Charlett Blake. [place], [place]   Patient ID: Adam Fitzpatrick, male    DOB: [birth year], 36 y.o.   MRN: [id number]  Chief Complaint  Patient presents with  . Follow-up  . Hypertension  . Diabetes    HPI  Patient is in today for a follow up on hypertension, diabetes and other medical concerns. He is well today but they do note he continues to lose weight. They report he gets hungry and eats daily although he is eating better than he used to. Very minimal carbohydrates. We have tried to add meal supplements but his sugars only spike. They deny any polyuria or polydipsia. He denies any change in bowel or bladder habits. There only complaint today is of some itching in his ears. It comes and goes. No discharge or hearing changes. Denies CP/palp/SOB/HA/congestion/fevers/GI or GU c/o. Taking meds as prescribed  Patient Care Team: Mosie Lukes, MD as PCP - General (Family Medicine)   Past Medical History:  Diagnosis Date  . Abdominal pain 12/01/2015  . Asthma   . Blood in urine   . Diabetes mellitus   . GERD (gastroesophageal reflux disease)   . Hyperlipidemia    notes only hx of hyperlipidemia  . Loss of weight 12/06/2015  . MR (mental retardation), moderate   . MRSA (methicillin resistant staph aureus) culture positive   . Osteoporosis 08/18/2016  . Routine general medical examination at a health care facility 10/15/2012    Past Surgical History:  Procedure Laterality Date  . HIP SURGERY     Left  . INTRAMEDULLARY (IM) NAIL INTERTROCHANTERIC Left 04/10/2014  . INTRAMEDULLARY (IM) NAIL INTERTROCHANTERIC Left 04/10/2014   Procedure: INTRAMEDULLARY (IM) NAIL INTERTROCHANTRIC LEFT HIP;  Surgeon: Renette Butters, MD;  Location: [facility];  Service: Orthopedics;  Laterality: Left;    Family History  Problem Relation Age of Onset  . Alcohol abuse Father   . Cancer Maternal Grandmother   . Hyperlipidemia Maternal Grandmother   . Heart disease Maternal  Grandmother   . Hypertension Maternal Grandmother   . Heart disease Maternal Grandfather   . Hypertension Maternal Grandfather   . Hyperlipidemia Maternal Grandfather   . Hyperlipidemia Paternal Grandmother   . Heart disease Paternal Grandmother   . Hypertension Paternal Grandmother   . Kidney disease Paternal Grandmother   . Diabetes Paternal Grandmother   . Heart disease Paternal Grandfather   . Hypertension Paternal Grandfather   . Hyperlipidemia Paternal Grandfather     Social History   Social History  . Marital status: Single    Spouse name: N/A  . Number of children: N/A  . Years of education: N/A   Occupational History  . Not on file.   Social History Main Topics  . Smoking status: Never Smoker  . Smokeless tobacco: Never Used  . Alcohol use No  . Drug use: No  . Sexual activity: No   Other Topics Concern  . Not on file   Social History Narrative  . No narrative on file    Outpatient Medications Prior to Visit  Medication Sig Dispense Refill  . acetaminophen (TYLENOL) 500 MG tablet Take 500 mg by mouth every 4 (four) hours as needed (pain; fever >100).     Marland Kitchen albuterol (PROVENTIL HFA;VENTOLIN HFA) 108 (90 BASE) MCG/ACT inhaler Inhale 2 puffs into the lungs 4 (four) times daily as needed for wheezing or shortness of breath. Shortness of breath/wheezing    . Calcium Carbonate (CALCIUM  600 PO) Take by mouth 2 (two) times daily.    . Cholecalciferol (VITAMIN D) 2000 units CAPS Take by mouth.    . diphenhydrAMINE (BENADRYL) 25 MG tablet Take 25-50 mg by mouth every 6 (six) hours as needed (allergic reaction). Take 2 tablets (50 mg) for ingestion of shellfish and seek emergency help.    . docusate sodium (COLACE) 100 MG capsule Take 1 capsule (100 mg total) by mouth 2 (two) times daily. Continue this while taking narcotics to help with bowel movements (Patient taking differently: Take 100 mg by mouth 2 (two) times daily. Continue this while taking narcotics to help with  bowel movements  PRN) 30 capsule 1  . fluticasone (FLONASE) 50 MCG/ACT nasal spray Place 1 spray into both nostrils daily as needed for allergies or rhinitis (PRN).     Marland Kitchen guaiFENesin-dextromethorphan (ROBITUSSIN DM) 100-10 MG/5ML syrup Take 15 mLs by mouth every 4 (four) hours as needed for cough (cold symptoms). Reported on 08/17/2015    . ibuprofen (ADVIL,MOTRIN) 200 MG tablet Take 200 mg by mouth every 4 (four) hours as needed.    . loperamide (IMODIUM A-D) 2 MG tablet Take 2 mg by mouth as needed for diarrhea or loose stools. Take 2 caplets  or 4 tsp (20 mls) liquid by mouth after first loose stool and 1 caplet or 1 tsp (5 mls) liquid by mouth for each subsequent stool as needed; not to exceed 8 caplets or 50 mls in 24 hours    . loratadine (CLARITIN) 10 MG tablet Take 1 tablet (10 mg total) by mouth daily. 30 tablet 11  . losartan (COZAAR) 25 MG tablet TAKE 1 TABLET BY MOUTH ONCE DAILY. 31 tablet 6  . metFORMIN (GLUCOPHAGE) 1000 MG tablet TAKE 1 TABLET BY MOUTH TWICE DAILY WITH A MEAL. 60 tablet 6  . saxagliptin HCl (ONGLYZA) 5 MG TABS tablet Take 1 tablet (5 mg total) by mouth daily. 30 tablet 3  . clotrimazole (LOTRIMIN) 1 % cream APPLY AS DIRECTED 2 TIMES DAILY  FOR 2 TO 4 WEEKS UNTIL RASH CLEARS. USE FOR 2 DAYS AFTER RASH IS CLEARED. Medicaiton is only to be used as needed. 30 g 0  . montelukast (SINGULAIR) 10 MG tablet TAKE 1 TABLET BY MOUTH ONCE DAILY. (Patient taking differently: TAKE 1 TABLET BY MOUTH ONCE DAILY. PRN) 31 tablet 6  . nystatin cream (MYCOSTATIN) Apply 1 application topically 2 (two) times daily. 30 g 0  . omeprazole (PRILOSEC) 20 MG capsule Take 1 capsule (20 mg total) by mouth daily. 31 capsule 8  . diphenhydrAMINE (SOMINEX) 25 MG tablet Take 2 tablets 1 hour prior to study 2 tablet 0  . tobramycin (TOBREX) 0.3 % ophthalmic solution Place 1 drop into both eyes every 6 (six) hours. 5 mL 0   No facility-administered medications prior to visit.     Allergies  Allergen  Reactions  . Shellfish Allergy Anaphylaxis  . Iohexol     IVP Dye    Review of Systems  Constitutional: Positive for weight loss. Negative for chills, fever and malaise/fatigue.  HENT: Negative for congestion, ear discharge, ear pain, hearing loss and tinnitus.   Eyes: Negative for discharge.  Respiratory: Negative for cough, sputum production and shortness of breath.   Cardiovascular: Negative for chest pain, palpitations and leg swelling.  Gastrointestinal: Negative for abdominal pain, blood in stool, constipation, diarrhea, heartburn, nausea and vomiting.  Genitourinary: Negative for dysuria, frequency, hematuria and urgency.  Musculoskeletal: Negative for back pain, falls and myalgias.  Skin: Negative for rash.  Neurological: Negative for dizziness, sensory change, loss of consciousness, weakness and headaches.  Endo/Heme/Allergies: Negative for environmental allergies. Does not bruise/bleed easily.  Psychiatric/Behavioral: Negative for depression and suicidal ideas. The patient is not nervous/anxious and does not have insomnia.        Objective:    Physical Exam  Constitutional: He is oriented to person, place, and time. He appears well-developed and well-nourished. No distress.  HENT:  Head: Normocephalic and atraumatic.  Dry skin in ear canals, no redness or swelling, no discharge.   Eyes: Conjunctivae are normal.  Neck: Neck supple. No thyromegaly present.  Cardiovascular: Normal rate, regular rhythm and normal heart sounds.   No murmur heard. Pulmonary/Chest: Effort normal and breath sounds normal. No respiratory distress. He has no wheezes.  Abdominal: Soft. Bowel sounds are normal. He exhibits no mass. There is no tenderness.  Musculoskeletal: He exhibits no edema.  Lymphadenopathy:    He has no cervical adenopathy.  Neurological: He is alert and oriented to person, place, and time.  Skin: Skin is warm and dry.  Psychiatric: He has a normal mood and affect. His  behavior is normal.    BP 98/68 (BP Location: Right Arm, Patient Position: Sitting, Cuff Size: Normal)   Pulse 93   Temp 98.6 F (37 C) (Oral)   Resp 18   Wt 136 lb 9.6 oz (62 kg)   SpO2 97%   BMI 18.53 kg/m  Wt Readings from Last 3 Encounters:  08/13/16 136 lb 9.6 oz (62 kg)  03/12/16 140 lb 6.4 oz (63.7 kg)  01/16/16 141 lb 12.8 oz (64.3 kg)     Lab Results  Component Value Date   WBC 7.2 08/13/2016   HGB 14.6 08/13/2016   HCT 43.2 08/13/2016   PLT 345.0 08/13/2016   GLUCOSE 142 (H) 12/01/2015   CHOL 143 08/13/2016   TRIG 119.0 08/13/2016   HDL 47.20 08/13/2016   LDLCALC 72 08/13/2016   ALT 8 (L) 12/01/2015   AST 11 12/01/2015   NA 140 12/01/2015   K 4.8 12/01/2015   CL 101 12/01/2015   CREATININE 0.82 12/01/2015   BUN 9 12/01/2015   CO2 28 12/01/2015   TSH 1.26 08/13/2016   HGBA1C 8.8 (H) 08/13/2016   MICROALBUR 2.3 (H) 10/23/2011    Lab Results  Component Value Date   TSH 1.26 08/13/2016   Lab Results  Component Value Date   WBC 7.2 08/13/2016   HGB 14.6 08/13/2016   HCT 43.2 08/13/2016   MCV 88.3 08/13/2016   PLT 345.0 08/13/2016   Lab Results  Component Value Date   NA 140 12/01/2015   K 4.8 12/01/2015   CO2 28 12/01/2015   GLUCOSE 142 (H) 12/01/2015   BUN 9 12/01/2015   CREATININE 0.82 12/01/2015   BILITOT 1.6 (H) 12/01/2015   ALKPHOS 50 12/01/2015   AST 11 12/01/2015   ALT 8 (L) 12/01/2015   PROT 7.5 12/01/2015   ALBUMIN 4.4 08/13/2016   CALCIUM 10.3 12/01/2015   ANIONGAP 13 04/11/2014   GFR 124.83 11/17/2015   Lab Results  Component Value Date   CHOL 143 08/13/2016   Lab Results  Component Value Date   HDL 47.20 08/13/2016   Lab Results  Component Value Date   LDLCALC 72 08/13/2016   Lab Results  Component Value Date   TRIG 119.0 08/13/2016   Lab Results  Component Value Date   CHOLHDL 3 08/13/2016   Lab Results  Component Value Date  HGBA1C 8.8 (H) 08/13/2016       Assessment & Plan:   Problem List Items  Addressed This Visit    DM type 2 (diabetes mellitus, type 2) ([place])    hgba1c acceptable, minimize simple carbs. Increase exercise as tolerated. Continue current meds but with hgba1c increasing despite improved diet with less carbohydrates will refer to endocrinology for further consideration.       Relevant Orders   Hemoglobin A1c (Completed)   Ambulatory referral to Endocrinology   Mental retardation    Doing well in group home hear today accompanied by his aunt. They offer no concerns regarding his living situation.       Hypertension   Relevant Orders   CBC (Completed)   Lipid panel (Completed)   TSH (Completed)   Loss of weight    They report he gets hungry and eats regularly but weight is below normal. They have tried meal supplements but they spike his sugars. They are encouraged to continue to increase protein intake in diet.       Osteoporosis    Received a call from his orthopaedist, Dr Layne Benton who notes a significant loss of bone density since last check. After discussion will refer to endocrinology for further consideration.       Relevant Orders   Ambulatory referral to Endocrinology    Other Visit Diagnoses    Weight loss    -  Primary   Relevant Orders   Albumin (Completed)   Lipid panel (Completed)   TSH (Completed)   Ambulatory referral to Endocrinology   Hyperglycemia       Relevant Orders   Hemoglobin A1c (Completed)      I have discontinued Mr. Bair clotrimazole, nystatin cream, omeprazole, and tobramycin. I have also changed his montelukast, ACCU-CHEK AVIVA PLUS, and ACCU-CHEK FASTCLIX LANCETS. Additionally, I am having him start on ranitidine. Lastly, I am having him maintain his albuterol, guaiFENesin-dextromethorphan, acetaminophen, loperamide, fluticasone, docusate sodium, diphenhydrAMINE, metFORMIN, losartan, saxagliptin HCl, ibuprofen, Calcium Carbonate (CALCIUM 600 PO), Vitamin D, and loratadine.  Meds ordered this encounter  Medications    . DISCONTD: ACCU-CHEK AVIVA PLUS test strip    Sig: 300 strips by Does not apply route 2 (two) times daily.    Refill:  2  . ranitidine (ZANTAC) 300 MG tablet    Sig: Take 1 tablet (300 mg total) by mouth at bedtime.    Dispense:  30 tablet    Refill:  5  . montelukast (SINGULAIR) 10 MG tablet    Sig: TAKE 1 TABLET BY MOUTH ONCE DAILY. PRN    Dispense:  31 tablet    Refill:  6  . DISCONTD: ACCU-CHEK FASTCLIX LANCETS MISC    Sig: 306 strips by Other route 2 (two) times daily as needed.    Refill:  2  . ACCU-CHEK AVIVA PLUS test strip    Sig: 1 each by Other route 2 (two) times daily.    Dispense:  100 each    Refill:  12  . ACCU-CHEK FASTCLIX LANCETS MISC    Sig: Check blood sugars 2 times daily PRN    Dispense:  306 each    Refill:  12    CMA served as scribe during this visit. History, Physical and Plan performed by medical provider. Documentation and orders reviewed and attested to.  Penni Homans, MD

## 2016-08-14 LAB — LIPID PANEL
CHOL/HDL RATIO: 3
Cholesterol: 143 mg/dL (ref 0–200)
HDL: 47.2 mg/dL (ref >39.00)
LDL CALC: 72 mg/dL (ref 0–99)
NONHDL: 95.61
Triglycerides: 119 mg/dL (ref 0.0–149.0)
VLDL: 23.8 mg/dL (ref 0.0–40.0)

## 2016-08-14 LAB — HEMOGLOBIN A1C: HEMOGLOBIN A1C: 8.8 % — AB (ref 4.6–6.5)

## 2016-08-14 LAB — CBC
HCT: 43.2 % (ref 39.0–52.0)
HEMOGLOBIN: 14.6 g/dL (ref 13.0–17.0)
MCHC: 33.9 g/dL (ref 30.0–36.0)
MCV: 88.3 fl (ref 78.0–100.0)
PLATELETS: 345 10*3/uL (ref 150.0–400.0)
RBC: 4.9 Mil/uL (ref 4.22–5.81)
RDW: 12.5 % (ref 11.5–15.5)
WBC: 7.2 10*3/uL (ref 4.0–10.5)

## 2016-08-14 LAB — TSH: TSH: 1.26 u[IU]/mL (ref 0.35–4.50)

## 2016-08-14 LAB — ALBUMIN: Albumin: 4.4 g/dL (ref 3.5–5.2)

## 2016-08-15 ENCOUNTER — Other Ambulatory Visit: Payer: Self-pay | Admitting: Family Medicine

## 2016-08-15 DIAGNOSIS — E119 Type 2 diabetes mellitus without complications: Secondary | ICD-10-CM

## 2016-08-15 MED ORDER — GLIMEPIRIDE 1 MG PO TABS: 1.0000 mg | ORAL_TABLET | Freq: Every day | ORAL | 5 refills | Status: DC

## 2016-08-16 DIAGNOSIS — M810 Age-related osteoporosis without current pathological fracture: Secondary | ICD-10-CM | POA: Diagnosis not present

## 2016-08-16 DIAGNOSIS — E559 Vitamin D deficiency, unspecified: Secondary | ICD-10-CM | POA: Diagnosis not present

## 2016-08-18 ENCOUNTER — Encounter: Payer: Self-pay | Admitting: Family Medicine

## 2016-08-18 DIAGNOSIS — M810 Age-related osteoporosis without current pathological fracture: Secondary | ICD-10-CM

## 2016-08-18 HISTORY — DX: Age-related osteoporosis without current pathological fracture: M81.0

## 2016-08-18 NOTE — Assessment & Plan Note (Signed)
Received a call from his orthopaedist, Dr Layne Benton who notes a significant loss of bone density since last check. After discussion will refer to endocrinology for further consideration.

## 2016-08-18 NOTE — Patient Instructions (Signed)
Carbohydrate Counting for Diabetes Mellitus, Adult Carbohydrate counting is a method for keeping track of how many carbohydrates you eat. Eating carbohydrates naturally increases the amount of sugar (glucose) in the blood. Counting how many carbohydrates you eat helps keep your blood glucose within normal limits, which helps you manage your diabetes (diabetes mellitus). It is important to know how many carbohydrates you can safely have in each meal. This is different for every person. A diet and nutrition specialist (registered dietitian) can help you make a meal plan and calculate how many carbohydrates you should have at each meal and snack. Carbohydrates are found in the following foods:  Grains, such as breads and cereals.  Dried beans and soy products.  Starchy vegetables, such as potatoes, peas, and corn.  Fruit and fruit juices.  Milk and yogurt.  Sweets and snack foods, such as cake, cookies, candy, chips, and soft drinks. How do I count carbohydrates? There are two ways to count carbohydrates in food. You can use either of the methods or a combination of both. Reading "Nutrition Facts" on packaged food  The "Nutrition Facts" list is included on the labels of almost all packaged foods and beverages in the [place] It includes:  The serving size.  Information about nutrients in each serving, including the grams (g) of carbohydrate per serving. To use the "Nutrition Facts":  Decide how many servings you will have.  Multiply the number of servings by the number of carbohydrates per serving.  The resulting number is the total amount of carbohydrates that you will be having. Learning standard serving sizes of other foods  When you eat foods containing carbohydrates that are not packaged or do not include "Nutrition Facts" on the label, you need to measure the servings in order to count the amount of carbohydrates:  Measure the foods that you will eat with a food scale or measuring  cup, if needed.  Decide how many standard-size servings you will eat.  Multiply the number of servings by 15. Most carbohydrate-rich foods have about 15 g of carbohydrates per serving.  For example, if you eat 8 oz (170 g) of strawberries, you will have eaten 2 servings and 30 g of carbohydrates (2 servings x 15 g = 30 g).  For foods that have more than one food mixed, such as soups and casseroles, you must count the carbohydrates in each food that is included. The following list contains standard serving sizes of common carbohydrate-rich foods. Each of these servings has about 15 g of carbohydrates:   hamburger bun or  English muffin.   oz (15 mL) syrup.   oz (14 g) jelly.  1 slice of bread.  1 six-inch tortilla.  3 oz (85 g) cooked rice or pasta.  4 oz (113 g) cooked dried beans.  4 oz (113 g) starchy vegetable, such as peas, corn, or potatoes.  4 oz (113 g) hot cereal.  4 oz (113 g) mashed potatoes or  of a large baked potato.  4 oz (113 g) canned or frozen fruit.  4 oz (120 mL) fruit juice.  4-6 crackers.  6 chicken nuggets.  6 oz (170 g) unsweetened dry cereal.  6 oz (170 g) plain fat-free yogurt or yogurt sweetened with artificial sweeteners.  8 oz (240 mL) milk.  8 oz (170 g) fresh fruit or one small piece of fruit.  24 oz (680 g) popped popcorn. Example of carbohydrate counting Sample meal  3 oz (85 g) chicken breast.  6 oz (  170 g) brown rice.  4 oz (113 g) corn.  8 oz (240 mL) milk.  8 oz (170 g) strawberries with sugar-free whipped topping. Carbohydrate calculation 1. Identify the foods that contain carbohydrates:  Rice.  Corn.  Milk.  Strawberries. 2. Calculate how many servings you have of each food:  2 servings rice.  1 serving corn.  1 serving milk.  1 serving strawberries. 3. Multiply each number of servings by 15 g:  2 servings rice x 15 g = 30 g.  1 serving corn x 15 g = 15 g.  1 serving milk x 15 g = 15  g.  1 serving strawberries x 15 g = 15 g. 4. Add together all of the amounts to find the total grams of carbohydrates eaten:  30 g + 15 g + 15 g + 15 g = 75 g of carbohydrates total. This information is not intended to replace advice given to you by your health care provider. Make sure you discuss any questions you have with your health care provider. Document Released: 05/20/2005 Document Revised: 12/08/2015 Document Reviewed: 11/01/2015 Elsevier Interactive Patient Education  2017 [employer]  

## 2016-08-18 NOTE — Assessment & Plan Note (Signed)
They report he gets hungry and eats regularly but weight is below normal. They have tried meal supplements but they spike his sugars. They are encouraged to continue to increase protein intake in diet.

## 2016-08-18 NOTE — Assessment & Plan Note (Signed)
Doing well in group home hear today accompanied by his aunt. They offer no concerns regarding his living situation.

## 2016-08-20 ENCOUNTER — Telehealth: Payer: Self-pay | Admitting: Family Medicine

## 2016-08-20 NOTE — Telephone Encounter (Signed)
-----   Message from Mosie Lukes, MD sent at 08/18/2016  7:20 PM EDT ----- Please let aunt know we had a phone call from his orthopaedist who notes his bone density is worsening. They are going to fax [place] over the results but for now we will refer to endocrinology for further consideration.

## 2016-08-20 NOTE — Telephone Encounter (Signed)
Called the aunt's cell/home number left message to call me back.

## 2016-08-26 ENCOUNTER — Encounter: Payer: Self-pay | Admitting: Family Medicine

## 2016-10-08 ENCOUNTER — Ambulatory Visit (INDEPENDENT_AMBULATORY_CARE_PROVIDER_SITE_OTHER): Payer: Medicare Other | Admitting: Internal Medicine

## 2016-10-08 ENCOUNTER — Encounter: Payer: Self-pay | Admitting: Internal Medicine

## 2016-10-08 VITALS — BP 114/74 | HR 72 | Ht 67.0 in | Wt 142.0 lb

## 2016-10-08 DIAGNOSIS — IMO0001 Reserved for inherently not codable concepts without codable children: Secondary | ICD-10-CM

## 2016-10-08 DIAGNOSIS — M859 Disorder of bone density and structure, unspecified: Secondary | ICD-10-CM | POA: Insufficient documentation

## 2016-10-08 DIAGNOSIS — Z794 Long term (current) use of insulin: Secondary | ICD-10-CM

## 2016-10-08 DIAGNOSIS — E0865 Diabetes mellitus due to underlying condition with hyperglycemia: Secondary | ICD-10-CM | POA: Diagnosis not present

## 2016-10-08 DIAGNOSIS — M8580 Other specified disorders of bone density and structure, unspecified site: Secondary | ICD-10-CM

## 2016-10-08 DIAGNOSIS — E1165 Type 2 diabetes mellitus with hyperglycemia: Secondary | ICD-10-CM | POA: Insufficient documentation

## 2016-10-08 DIAGNOSIS — E119 Type 2 diabetes mellitus without complications: Secondary | ICD-10-CM | POA: Diagnosis not present

## 2016-10-08 LAB — COMPLETE METABOLIC PANEL WITH GFR
ALBUMIN: 4.5 g/dL (ref 3.6–5.1)
ALK PHOS: 50 U/L (ref 40–115)
ALT: 20 U/L (ref 9–46)
AST: 17 U/L (ref 10–40)
BILIRUBIN TOTAL: 1.5 mg/dL — AB (ref 0.2–1.2)
BUN: 14 mg/dL (ref 7–25)
CO2: 23 mmol/L (ref 20–31)
CREATININE: 0.77 mg/dL (ref 0.60–1.35)
Calcium: 9.8 mg/dL (ref 8.6–10.3)
Chloride: 104 mmol/L (ref 98–110)
GFR, Est African American: >89 mL/min (ref >=60)
GFR, Est Non African American: >89 mL/min (ref >=60)
GLUCOSE: 73 mg/dL (ref 65–99)
Potassium: 4.5 mmol/L (ref 3.5–5.3)
SODIUM: 142 mmol/L (ref 135–146)
TOTAL PROTEIN: 7.3 g/dL (ref 6.1–8.1)

## 2016-10-08 LAB — MAGNESIUM: Magnesium: 1.8 mg/dL (ref 1.5–2.5)

## 2016-10-08 LAB — PHOSPHORUS: Phosphorus: 4.3 mg/dL (ref 2.3–4.6)

## 2016-10-08 NOTE — Progress Notes (Signed)
Patient ID: Adam Fitzpatrick, male   DOB: 1981/01/23, 36 y.o.   MRN: [id number]   HPI: Adam Fitzpatrick) is a 36 y.o.-year-old male, referred by his PCP, Dr. Charlett Blake, for management of DM2, dx in 2011, non-insulin-dependent, uncontrolled, without complications. He also has low BMD for age. He is here with his aunt who offers most of the hx.  He lives in a group home b/c he has MR + difficult situation at home (mother with opioid addiction).  His aunt tells me that he lost a significant amount of weight in last few years: 25-30 lbs - confirmed by chart review.  DM2: Last hemoglobin A1c was: Lab Results  Component Value Date   HGBA1C 8.8 (H) 08/13/2016   HGBA1C 7.4 (H) 11/17/2015   HGBA1C 9.0 (H) 06/09/2015   HGBA1C 6.5 02/09/2014   HGBA1C 8.2 (H) 10/06/2013   HGBA1C 6.1 10/15/2012   HGBA1C 6.2 10/23/2011   Pt is on a regimen of: - Metformin 1000 mg 2x a day, with meals - Onglyza 5 mg daily in am - Amaryl 1 mg daily in am >> started 08/2016  Pt checks his sugars 1x a day and they are better after starting Amaryl: - am: 83-145, 164, 185 - 2h after b'fast: n/c - before lunch: n/c - 2h after lunch: n/c - before dinner: n/c - 2h after dinner: n/c - bedtime: n/c - nighttime: n/c No lows. Lowest sugar was 83; ? has hypoglycemia awareness. Highest sugar was 360s (after banana pudding), OTW 200s.   Glucometer: AccuChek Aviva  Pt's meals are: - Breakfast: oatmeal, eggs, bacon, sausage, pancakes (sugarfree syrup) - Lunch: 2 PB sandwiches on wheat - Dinner: low carb - Snacks: before bedtime  - no CKD, last BUN/creatinine:  Lab Results  Component Value Date   BUN 9 12/01/2015   BUN 11 11/17/2015   CREATININE 0.82 12/01/2015   CREATININE 0.76 11/17/2015  On Losartan. - last set of lipids: Lab Results  Component Value Date   CHOL 143 08/13/2016   HDL 47.20 08/13/2016   LDLCALC 72 08/13/2016   TRIG 119.0 08/13/2016   CHOLHDL 3 08/13/2016   - last eye exam was  10/08/2016. No DR. Dr. Nancee Liter in [place]. - no numbness and tingling in his feet.  Pt has FH of DM in father and Paternal GPs  He also has low BMD for age:  Reviewed available DEXA scan:  Z-score Lumbar spine (L1-L4) Femoral neck (FN) 33% distal radius UD radius  08/13/2016 -2.6 RFN: -2.4 LFN: n/a -0.1 n/a  05/04/2014 -2.1 RFN: -2.2 +0.6 +0.1  No dizziness/vertigo/orthostasis.  He had a L hip fx in 2015 roler skating ([employer]).  No h/o vitamin D deficiency. Reviewed available vit D levels: Lab Results  Component Value Date   VD25OH 72.84 11/17/2015   Pt is on calcium 600 mg bid and vitamin D 2000 IU.   No known h/o celiac disease, multiple myeloma.  No weight bearing exercises.   He does not take high vitamin A doses.  Pt does have a FH of osteoporosis: MGM.  No h/o hyper/hypocalcemia or hyperparathyroidism. No h/o kidney stones. Lab Results  Component Value Date   CALCIUM 10.3 12/01/2015   CALCIUM 9.8 11/17/2015   CALCIUM 10.2 06/09/2015   CALCIUM 8.8 04/11/2014   CALCIUM 9.8 04/09/2014   CALCIUM 9.4 02/09/2014   CALCIUM 9.5 10/06/2013   CALCIUM 9.4 10/15/2012   CALCIUM 9.7 10/23/2011   CALCIUM 9.3 11/18/2009   No h/o thyrotoxicosis. Reviewed TSH  recent levels:  Lab Results  Component Value Date   TSH 1.26 08/13/2016   TSH 0.96 11/17/2015   TSH 1.14 02/09/2014   TSH 0.71 10/06/2013   TSH 1.29 10/15/2012   No h/o CKD. Last BUN/Cr: Lab Results  Component Value Date   BUN 9 12/01/2015   CREATININE 0.82 12/01/2015    ROS: Constitutional: + weight loss, no fatigue, no subjective hyperthermia/hypothermia Eyes: no blurry vision, no xerophthalmia ENT: no sore throat, no nodules palpated in throat, no dysphagia/odynophagia, no hoarseness Cardiovascular: no CP/SOB/palpitations/leg swelling Respiratory: no cough/SOB Gastrointestinal: no N/V/D/C Musculoskeletal: no muscle/joint aches Skin: no rashes Neurological: no  tremors/numbness/tingling/dizziness Psychiatric: no depression/anxiety  Past Medical History:  Diagnosis Date  . Abdominal pain 12/01/2015  . Asthma   . Blood in urine   . Diabetes mellitus   . GERD (gastroesophageal reflux disease)   . Hyperlipidemia    notes only hx of hyperlipidemia  . Loss of weight 12/06/2015  . MR (mental retardation), moderate   . MRSA (methicillin resistant staph aureus) culture positive   . Osteoporosis 08/18/2016  . Routine general medical examination at a health care facility 10/15/2012   Past Surgical History:  Procedure Laterality Date  . HIP SURGERY     Left  . INTRAMEDULLARY (IM) NAIL INTERTROCHANTERIC Left 04/10/2014  . INTRAMEDULLARY (IM) NAIL INTERTROCHANTERIC Left 04/10/2014   Procedure: INTRAMEDULLARY (IM) NAIL INTERTROCHANTRIC LEFT HIP;  Surgeon: Renette Butters, MD;  Location: [facility];  Service: Orthopedics;  Laterality: Left;   Social History   Social History  . Marital status: Single    Spouse name: N/A  . Number of children: 0   Occupational History  . disabled   Social History Main Topics  . Smoking status: Never Smoker  . Smokeless tobacco: Never Used  . Alcohol use No  . Drug use: No   Current Outpatient Prescriptions on File Prior to Visit  Medication Sig Dispense Refill  . ACCU-CHEK AVIVA PLUS test strip 1 each by Other route 2 (two) times daily. 100 each 12  . ACCU-CHEK FASTCLIX LANCETS MISC Check blood sugars 2 times daily PRN 306 each 12  . acetaminophen (TYLENOL) 500 MG tablet Take 500 mg by mouth every 4 (four) hours as needed (pain; fever >100).     Marland Kitchen albuterol (PROVENTIL HFA;VENTOLIN HFA) 108 (90 BASE) MCG/ACT inhaler Inhale 2 puffs into the lungs 4 (four) times daily as needed for wheezing or shortness of breath. Shortness of breath/wheezing    . Calcium Carbonate (CALCIUM 600 PO) Take by mouth 2 (two) times daily.    . Cholecalciferol (VITAMIN D) 2000 units CAPS Take by mouth.    . diphenhydrAMINE (BENADRYL) 25 MG  tablet Take 25-50 mg by mouth every 6 (six) hours as needed (allergic reaction). Take 2 tablets (50 mg) for ingestion of shellfish and seek emergency help.    . docusate sodium (COLACE) 100 MG capsule Take 1 capsule (100 mg total) by mouth 2 (two) times daily. Continue this while taking narcotics to help with bowel movements (Patient taking differently: Take 100 mg by mouth 2 (two) times daily. Continue this while taking narcotics to help with bowel movements  PRN) 30 capsule 1  . fluticasone (FLONASE) 50 MCG/ACT nasal spray Place 1 spray into both nostrils daily as needed for allergies or rhinitis (PRN).     Marland Kitchen glimepiride (AMARYL) 1 MG tablet Take 1 tablet (1 mg total) by mouth daily with breakfast. 30 tablet 5  . guaiFENesin-dextromethorphan (ROBITUSSIN DM) 100-10 MG/5ML syrup  Take 15 mLs by mouth every 4 (four) hours as needed for cough (cold symptoms). Reported on 08/17/2015    . ibuprofen (ADVIL,MOTRIN) 200 MG tablet Take 200 mg by mouth every 4 (four) hours as needed.    . loperamide (IMODIUM A-D) 2 MG tablet Take 2 mg by mouth as needed for diarrhea or loose stools. Take 2 caplets  or 4 tsp (20 mls) liquid by mouth after first loose stool and 1 caplet or 1 tsp (5 mls) liquid by mouth for each subsequent stool as needed; not to exceed 8 caplets or 50 mls in 24 hours    . loratadine (CLARITIN) 10 MG tablet Take 1 tablet (10 mg total) by mouth daily. 30 tablet 11  . losartan (COZAAR) 25 MG tablet TAKE 1 TABLET BY MOUTH ONCE DAILY. 31 tablet 6  . metFORMIN (GLUCOPHAGE) 1000 MG tablet TAKE 1 TABLET BY MOUTH TWICE DAILY WITH A MEAL. 60 tablet 6  . montelukast (SINGULAIR) 10 MG tablet TAKE 1 TABLET BY MOUTH ONCE DAILY. PRN 31 tablet 6  . ranitidine (ZANTAC) 300 MG tablet Take 1 tablet (300 mg total) by mouth at bedtime. 30 tablet 5  . saxagliptin HCl (ONGLYZA) 5 MG TABS tablet Take 1 tablet (5 mg total) by mouth daily. 30 tablet 3   No current facility-administered medications on file prior to visit.     Allergies  Allergen Reactions  . Shellfish Allergy Anaphylaxis  . Iohexol     IVP Dye   Family History  Problem Relation Age of Onset  . Alcohol abuse Father   . Cancer Maternal Grandmother   . Hyperlipidemia Maternal Grandmother   . Heart disease Maternal Grandmother   . Hypertension Maternal Grandmother   . Heart disease Maternal Grandfather   . Hypertension Maternal Grandfather   . Hyperlipidemia Maternal Grandfather   . Hyperlipidemia Paternal Grandmother   . Heart disease Paternal Grandmother   . Hypertension Paternal Grandmother   . Kidney disease Paternal Grandmother   . Diabetes Paternal Grandmother   . Heart disease Paternal Grandfather   . Hypertension Paternal Grandfather   . Hyperlipidemia Paternal Grandfather    PE: BP 114/74 (BP Location: Left Arm, Patient Position: Sitting)   Pulse 72   Ht 5' 7" (1.702 m)   Wt 142 lb (64.4 kg)   SpO2 98%   BMI 22.24 kg/m  Wt Readings from Last 3 Encounters:  10/08/16 142 lb (64.4 kg)  08/13/16 136 lb 9.6 oz (62 kg)  03/12/16 140 lb 6.4 oz (63.7 kg)   Constitutional: thin,  in NAD, wears dark shades (dilated eye exam today), mostly nonverbal Eyes: PERRLA, EOMI, no exophthalmos ENT: moist mucous membranes, no thyromegaly, no cervical lymphadenopathy Cardiovascular: RRR, No MRG Respiratory: CTA B Gastrointestinal: abdomen soft, NT, ND, BS+ Musculoskeletal:  strength intact in all 4, kyphosis, scoliosis, hunched posture Skin: moist, warm, no rashes Neurological: no tremor with outstretched hands, DTR normal in all 4  ASSESSMENT: 1. DM2, non-insulin-dependent, uncontrolled, without long term complications  2. Low BMD for age  PLAN:  1. Patient with recently more uncontrolled diabetes, on oral antidiabetic regimen, with improved sugars after started Amaryl 1.5 mo ago. He only checks sugars in am but these are better. We discussed about the importance of checking sugars later in the day, rotating checked times, but  for now, I suggested to continue the current regimen. - As his HbA1c has increased + he was losing weight (now improved) >> I would like to check him for type  1 diabetes: We'll check a C-peptide, fasting glucose, GAD Ab's, ICA Ab's. CBG in the office 121. - I suggested to:  Patient Instructions   Please continue: - Metformin 1000 mg 2x a day, with meals - Onglyza 5 mg daily in am - Amaryl 1 mg daily in am  Check blood sugars 1x a day, rotating check times >> check before meals and bedtime.  Please continue calcium 600 mg 2x a day and vitamin D 2000 units daily.  Please stop at the lab.  - given sugar log and advised how to fill it and to bring it at next appt  - given foot care handout and explained the principles  - given instructions for hypoglycemia management "15-15 rule"  - advised for yearly eye exams >> he had one today >> normal - Return to clinic in 3 mo with sugar log   2. Low BMD for age - pt had a hip fx while roller skating in 2015 >> a DEXA scan showed mild low BMD for age. He was started on calcium and vitamin D at that time. A repeat DEXA scan this year, showed worsening of his Z scores. - A recent vitamin D was normal, and he does not have evidence of kidney ds, thyroid ds, hypophosphatasia, anemia - we reviewed her dietary and supplemental calcium and vitamin D intake. He gets at least 1200 mg of calcium daily and 2000 units Vitamin D daily - discussed fall precautions   - given handout from [facility]: weight bearing exercises - he needs to do this every day or at least 5/7 days - he appear to maintain a good amount of protein in his diet. The recommended daily protein intake is ~0.8 g per kilogram per day. He is not smoking or drinking alcohol. - We discussed about the different medication classes, benefits and side effects (including atypical fractures and ONJ - no dental workup in progress or planned).  - we can use oral bisphosphonates,  but my first choice (due to ease of adm) would be sq denosumab (Prolia) or zoledronic acid (iv Reclast). I would use Teriparatide as a last resort.  - however, I would check for secondary causes for low BMD first: Celiac ds., Multiple myeloma, HPTH, 24 hydroxylase deficiency, calcium malabsorption  Orders Placed This Encounter  Procedures  . Anti-islet cell antibody  . Glutamic acid decarboxylase auto abs  . C-peptide  . COMPLETE METABOLIC PANEL WITH GFR  . Parathyroid hormone, intact (no Ca)  . Phosphorus  . Magnesium  . Vitamin D 1,25 dihydroxy  . Creatinine, urine, 24 hour  . Calcium, urine, 24 hour  . Celiac Disease Ab Screen w/Rfx  . Protein Electrophoresis, Urine Rflx.   - time spent with the patient and his aunt: 1 hour, of which >50% was spent in obtaining information about his symptoms, reviewing his previous labs, evaluations, and treatments, counseling him about his conditions (please see the discussed topics above), and developing a plan to further investigate it; his aunt had a number of questions which I addressed.  Component     Latest Ref Rng & Units 10/08/2016 10/14/2016           Sodium     135 - 146 mmol/L 142   Potassium     3.5 - 5.3 mmol/L 4.5   Chloride     98 - 110 mmol/L 104   CO2     20 - 31 mmol/L 23   Glucose  65 - 99 mg/dL 73   BUN     7 - 25 mg/dL 14   Creatinine     0.60 - 1.35 mg/dL 0.77   Total Bilirubin     0.2 - 1.2 mg/dL 1.5 (H)   Alkaline Phosphatase     40 - 115 U/L 50   AST     10 - 40 U/L 17   ALT     9 - 46 U/L 20   Total Protein     6.1 - 8.1 g/dL 7.3   Albumin     3.6 - 5.1 g/dL 4.5   Calcium     8.6 - 10.3 mg/dL 9.8   GFR, Est African American     >=60 mL/min >89   GFR, Est Non African American     >=60 mL/min >89   Protein Urine Random     Not Estab. mg/dL 19.8   Albumin ELP, Urine     % 40.3   Alpha-1-Globulin, U     % 2.1   ALPHA-2-GLOBULIN, U     % 15.1   Beta Globulin, U     % 26.9   Gamma Globulin,  U     % 15.7   M Component, Ur     Not Observed % Not Observed   Please Note:      Comment   Vitamin D 1, 25 (OH) Total     18 - 72 pg/mL 31   Vitamin D3 1, 25 (OH)     pg/mL 31   Vitamin D2 1, 25 (OH)     pg/mL <8   Antigliadin Abs, IgA     0 - 19 units 2   Transglutaminase IgA     0 - 3 U/mL <2   IgA/Immunoglobulin A, Serum     90 - 386 mg/dL 140   Creatinine, Urine     20 - 370 mg/dL  121  Creatinine, 24H Ur     0.63 - 2.50 g/24 h  1.57  Calcium, Ur     Not estab mg/dL  23  Calcium, 24 hour urine     55 - 300 mg/24 h  299  Pancreatic Islet Cell Antibody     <5 JDF Units <5   Glutamic Acid Decarb Ab     <5 IU/mL 14 (H)   C-Peptide     0.80 - 3.85 ng/mL 1.29   PTH     15 - 65 pg/mL 21   Phosphorus     2.3 - 4.6 mg/dL 4.3   Magnesium     1.5 - 2.5 mg/dL 1.8    PTH, calcium, phosphorus, magnesium, urinary calcium, 1,25 dihydroxy vitamin D all normal. Multiple myeloma labs also normal. Kidney and liver tests normal except slightly high bilirubin, which is not new. Since there do not appear to be any secondary causes for his low bone mineral density for age (other than weight loss), I would suggest to start Fosamax once a week for 2 years, after which we will check another DEXA scan.   Glucose and C-peptide are normal, however, his GAD antibodies are slightly high. Islet cell antibodies are normal. This is indicative of possible type 1 diabetes (in the honeymoon period) or LADA. This may be the reason why he lost weight. I would suggest to start basal insulin to protect his pancreatic beta cells. I will try to bring him back for another appointment to discuss about this and demonstrate insulin pen use.  Philemon Kingdom, MD PhD [facility]

## 2016-10-08 NOTE — Patient Instructions (Signed)
Please continue: - Metformin 1000 mg 2x a day, with meals - Onglyza 5 mg daily in am - Amaryl 1 mg daily in am  Check blood sugars 1x a day, rotating check times >> check before meals and bedtime.  Please continue calcium 600 mg 2x a day and vitamin D 2000 units daily.  Please stop at the lab.  Patient information (Up-to-Date): Collection of a 24-hour urine specimen  - You should collect every drop of urine during each 24-hour period. It does not matter how much or little urine is passed each time, as long as every drop is collected. - Begin the urine collection in the morning after you wake up, after you have emptied your bladder for the first time. - Urinate (empty the bladder) for the first time and flush it down the toilet. Note the exact time (eg, 6:15 AM). You will begin the urine collection at this time. - Collect every drop of urine during the day and night in an empty collection bottle. Store the bottle at room temperature or in the refrigerator. - If you need to have a bowel movement, any urine passed with the bowel movement should be collected. Try not to include feces with the urine collection. If feces does get mixed in, do not try to remove the feces from the urine collection bottle. - Finish by collecting the first urine passed the next morning, adding it to the collection bottle. This should be within ten minutes before or after the time of the first morning void on the first day (which was flushed). In this example, you would try to void between 6:05 and 6:25 on the second day. - If you need to urinate one hour before the final collection time, drink a full glass of water so that you can void again at the appropriate time. If you have to urinate 20 minutes before, try to hold the urine until the proper time. - Please note the exact time of the final collection, even if it is not the same time as when collection began on day 1. - The bottle(s) may be kept at room temperature for a  day or two, but should be kept cool or refrigerated for longer periods of time.  Please return in 3 months with your sugar log.   How Can I Prevent Falls? Men and women with osteoporosis need to take care not to fall down. Falls can break bones. Some reasons people fall are: Poor vision  Poor balance  Certain diseases that affect how you walk  Some types of medicine, such as sleeping pills.  Some tips to help prevent falls outdoors are: Use a cane or walker  Wear rubber-soled shoes so you don't slip  Walk on grass when sidewalks are slippery  In winter, put salt or kitty litter on icy sidewalks.  Some ways to help prevent falls indoors are: Keep rooms free of clutter, especially on floors  Use plastic or carpet runners on slippery floors  Wear low-heeled shoes that provide good support  Do not walk in socks, stockings, or slippers  Be sure carpets and area rugs have skid-proof backs or are tacked to the floor  Be sure stairs are well lit and have rails on both sides  Put grab bars on bathroom walls near tub, shower, and toilet  Use a rubber bath mat in the shower or tub  Keep a flashlight next to your bed  Use a sturdy step stool with a handrail and wide  steps  Add more lights in rooms (and night lights) Buy a cordless phone to keep with you so that you don't have to rush to the phone       when it rings and so that you can call for help if you fall.   (adapted from http://www.niams.NightlifePreviews.se)   Exercise for Strong Bones (from [employer]) There are two types of exercises that are important for building and maintaining bone density:  weight-bearing and muscle-strengthening exercises. Weight-bearing Exercises These exercises include activities that make you move against gravity while staying upright. Weight-bearing exercises can be high-impact or low-impact. High-impact weight-bearing exercises help build bones  and keep them strong. If you have broken a bone due to osteoporosis or are at risk of breaking a bone, you may need to avoid high-impact exercises. If you're not sure, you should check with your healthcare provider. Examples of high-impact weight-bearing exercises are: . Dancing . Doing high-impact aerobics . Hiking . Jogging/running . Jumping Rope . Stair climbing . Tennis Low-impact weight-bearing exercises can also help keep bones strong and are a safe alternative if you cannot do high-impact exercises. Examples of low-impact weight-bearing exercises are: . Using elliptical training machines . Doing low-impact aerobics . Using stair-step machines . Fast walking on a treadmill or outside Muscle-Strengthening Exercises These exercises include activities where you move your body, a weight or some other resistance against gravity. They are also known as resistance exercises and include: . Lifting weights . Using elastic exercise bands . Using weight machines . Lifting your own body weight . Functional movements, such as standing and rising up on your toes Yoga and Pilates can also improve strength, balance and flexibility. However, certain positions may not be safe for people with osteoporosis or those at increased risk of broken bones. For example, exercises that have you bend forward may increase the chance of breaking a bone in the spine. A physical therapist should be able to help you learn which exercises are safe and appropriate for you. Non-Impact Exercises Non-impact exercises can help you to improve balance, posture and how well you move in everyday activities. These exercises can also help to increase muscle strength and decrease the risk of falls and broken bones. Some of these exercises include: . Balance exercises that strengthen your legs and test your balance, such as Tai Chi, can decrease your risk of falls. . Posture exercises that improve your posture and reduce rounded or  "sloping" shoulders can help you decrease the chance of breaking a bone, especially in the spine. . Functional exercises that improve how well you move can help you with everyday activities and decrease your chance of falling and breaking a bone. For example, if you have trouble getting up from a chair or climbing stairs, you should do these activities as exercises. A physical therapist can teach you balance, posture and functional exercises. Starting a New Exercise Program If you haven't exercised regularly for a while, check with your healthcare provider before beginning a new exercise program-particularly if you have health problems such as heart disease, diabetes or high blood pressure. If you're at high risk of breaking a bone, you should work with a physical therapist to develop a safe exercise program. Once you have your healthcare provider's approval, start slowly. If you've already broken bones in the spine because of osteoporosis, be very careful to avoid activities that require reaching down, bending forward, rapid twisting motions, heavy lifting and those that increase your chance of a fall.  As you get started, your muscles may feel sore for a day or two after you exercise. If soreness lasts longer, you may be working too hard and need to ease up. Exercises should be done in a pain-free range of motion. How Much Exercise Do You Need? Weight-bearing exercises 30 minutes on most days of the week. Do a 30-minutesession or multiple sessions spread out throughout the day. The benefits to your bones are the same.   Muscle-strengthening exercises Two to three days per week. If you don't have much time for strengthening/resistance training, do small amounts at a time. You can do just one body part each day. For example do arms one day, legs the next and trunk the next. You can also spread these exercises out during your normal day.  Balance, posture and functional exercises Every day or as often as  needed. You may want to focus on one area more than the others. If you have fallen or lose your balance, spend time doing balance exercises. If you are getting rounded shoulders, work more on posture exercises. If you have trouble climbing stairs or getting up from the couch, do more functional exercises. You can also perform these exercises at one time or spread them during your day. Work with a phyiscal therapist to learn the right exercises for you.

## 2016-10-09 LAB — C-PEPTIDE: C-Peptide: 1.29 ng/mL (ref 0.80–3.85)

## 2016-10-10 ENCOUNTER — Encounter: Payer: Self-pay | Admitting: Family Medicine

## 2016-10-10 LAB — PROTEIN ELECTROPHORESIS, URINE REFLEX
ALPHA-1-GLOBULIN, U: 2.1 %
ALPHA-2-GLOBULIN, U: 15.1 %
Albumin ELP, Urine: 40.3 %
BETA GLOBULIN, U: 26.9 %
GAMMA GLOBULIN, U: 15.7 %
Protein, Ur: 19.8 mg/dL

## 2016-10-10 LAB — PARATHYROID HORMONE, INTACT (NO CA): PTH: 21 pg/mL (ref 15–65)

## 2016-10-10 LAB — CELIAC DISEASE AB SCREEN W/RFX
Antigliadin Abs, IgA: 2 units (ref 0–19)
IgA/Immunoglobulin A, Serum: 140 mg/dL (ref 90–386)
Transglutaminase IgA: <2 U/mL (ref 0–3)

## 2016-10-10 LAB — GLUTAMIC ACID DECARBOXYLASE AUTO ABS: Glutamic Acid Decarb Ab: 14 IU/mL — ABNORMAL HIGH (ref <5)

## 2016-10-11 LAB — VITAMIN D 1,25 DIHYDROXY
Vitamin D 1, 25 (OH)2 Total: 31 pg/mL (ref 18–72)
Vitamin D2 1, 25 (OH)2: <8 pg/mL
Vitamin D3 1, 25 (OH)2: 31 pg/mL

## 2016-10-14 DIAGNOSIS — M8580 Other specified disorders of bone density and structure, unspecified site: Secondary | ICD-10-CM | POA: Diagnosis not present

## 2016-10-15 LAB — ANTI-ISLET CELL ANTIBODY: Pancreatic Islet Cell Antibody: <5 JDF Units (ref <5)

## 2016-10-15 LAB — CALCIUM, URINE, 24 HOUR
CALCIUM UR: 23 mg/dL
Calcium, 24 hour urine: 299 mg/24 h (ref 55–300)

## 2016-10-15 LAB — CREATININE, URINE, 24 HOUR
CREATININE 24H UR: 1.57 g/(24.h) (ref 0.63–2.50)
Creatinine, Urine: 121 mg/dL (ref 20–370)

## 2016-10-21 ENCOUNTER — Encounter: Payer: Self-pay | Admitting: Internal Medicine

## 2016-10-21 ENCOUNTER — Ambulatory Visit (INDEPENDENT_AMBULATORY_CARE_PROVIDER_SITE_OTHER): Payer: Medicare Other | Admitting: Internal Medicine

## 2016-10-21 VITALS — BP 110/78 | HR 85 | Wt 140.0 lb

## 2016-10-21 DIAGNOSIS — E0865 Diabetes mellitus due to underlying condition with hyperglycemia: Secondary | ICD-10-CM | POA: Diagnosis not present

## 2016-10-21 DIAGNOSIS — M8580 Other specified disorders of bone density and structure, unspecified site: Secondary | ICD-10-CM

## 2016-10-21 DIAGNOSIS — M859 Disorder of bone density and structure, unspecified: Secondary | ICD-10-CM

## 2016-10-21 DIAGNOSIS — IMO0001 Reserved for inherently not codable concepts without codable children: Secondary | ICD-10-CM

## 2016-10-21 MED ORDER — ALENDRONATE SODIUM 70 MG PO TABS
70.0000 mg | ORAL_TABLET | ORAL | 3 refills | Status: DC
Start: 2016-10-21 — End: 2017-07-16

## 2016-10-21 MED ORDER — INSULIN PEN NEEDLE 32G X 4 MM MISC: 3 refills | Status: DC

## 2016-10-21 MED ORDER — INSULIN GLARGINE 100 UNIT/ML SOLOSTAR PEN: 6.0000 [IU] | PEN_INJECTOR | Freq: Every day | SUBCUTANEOUS | 11 refills | Status: DC

## 2016-10-21 NOTE — Patient Instructions (Addendum)
Please start: - Lantus 6 units at bedtime (or after dinner, whichever is easier)  Stop: - Amaryl  Continue: - Metformin 1000 mg 2x a day, with meals - Onglyza 5 mg daily in am  Start: - Fosamax 70 mg weekly in am, with a full glass of water  Please return in August with your sugar log.

## 2016-10-21 NOTE — Progress Notes (Signed)
Patient ID: Adam Fitzpatrick, male   DOB: 1981/03/19, 36 y.o.   MRN: [id number]   HPI: Adam Fitzpatrick [facility]) is a 36 y.o.-year-old male, returning for f/u for DM1.5, dx as DM2 in 2011, insulin-dependent, uncontrolled, without complications. He also has low BMD for age. He is here with his aunt who offers most of the hx. Last visit 2 weeks ago >> I called pt back to discussed results at this visit.  DM1.5: Last hemoglobin A1c was: Lab Results  Component Value Date   HGBA1C 8.8 (H) 08/13/2016   HGBA1C 7.4 (H) 11/17/2015   HGBA1C 9.0 (H) 06/09/2015   HGBA1C 6.5 02/09/2014   HGBA1C 8.2 (H) 10/06/2013   HGBA1C 6.1 10/15/2012   HGBA1C 6.2 10/23/2011   Pt is on a regimen of: - Metformin 1000 mg 2x a day, with meals - Onglyza 5 mg daily in am - Amaryl 1 mg daily in am >> started 08/2016  He lost a significant amount of weight in last few years: 25-30 lbs.  At last visit, we checked him for DM1 and his GAD Abs were positive: Component     Latest Ref Rng & Units 10/08/2016  Glucose     65 - 99 mg/dL 73  Pancreatic Islet Cell Antibody     <5 JDF Units <5  Glutamic Acid Decarb Ab     <5 IU/mL 14 (H)  C-Peptide     0.80 - 3.85 ng/mL 1.29   Pt checks his sugars 1x a day (at the group home): - am: 83-145, 164, 185 >> 165, 205 - 2h after b'fast: n/c >> 138, 145 - before lunch: n/c >> 145 - 2h after lunch: n/c >> 128 - before dinner: n/c >> 138 - 2h after dinner: n/c >> 150, 181 - bedtime: n/c - nighttime: n/c No lows. Lowest sugar was 83; ? has hypoglycemia awareness. Highest sugar was 360s (after banana pudding), OTW 200s.   Glucometer: AccuChek [employer]iva  Pt's meals are: - Breakfast: oatmeal, eggs, bacon, sausage, pancakes (sugarfree syrup) - Lunch: 2 PB sandwiches on wheat - Dinner: low carb - Snacks: before bedtime  - no CKD, last BUN/creatinine:  Lab Results  Component Value Date   BUN 14 10/08/2016   BUN 9 12/01/2015   CREATININE 0.77 10/08/2016   CREATININE 0.82  12/01/2015  On Losartan. - last set of lipids: Lab Results  Component Value Date   CHOL 143 08/13/2016   HDL 47.20 08/13/2016   LDLCALC 72 08/13/2016   TRIG 119.0 08/13/2016   CHOLHDL 3 08/13/2016   - last eye exam was 10/08/2016. No DR. Dr. Nancee Liter in [place]. - denies numbness and tingling in his feet.  He also has low BMD for age:  Reviewed and addended hx: Reviewed available DEXA scan:  Z-score Lumbar spine (L1-L4) Femoral neck (FN) 33% distal radius UD radius  08/13/2016 -2.6 RFN: -2.4 LFN: n/a -0.1 n/a  05/04/2014 -2.1 RFN: -2.2 +0.6 +0.1  No dizziness/vertigo/orthostasis.  He had a L hip fx in 2015 [place] skating [place]  No h/o vitamin D deficiency. Reviewed available vit D levels: Lab Results  Component Value Date   VD25OH 72.84 11/17/2015   Component     Latest Ref Rng & Units 10/08/2016  Vitamin D 1, 25 (OH) Total     18 - 72 pg/mL 31  Vitamin D3 1, 25 (OH)     pg/mL 31  Vitamin D2 1, 25 (OH)     pg/mL <8   Pt is  on calcium 600 mg bid and vitamin D 2000 IU.   No known h/o celiac disease, multiple myeloma.  No weight bearing exercises.   He does not take high vitamin A doses.  Pt does have a FH of osteoporosis: MGM.  No h/o hyper/hypocalcemia or hyperparathyroidism (checked PTH at last OV >> normal). No h/o kidney stones. Lab Results  Component Value Date   PTH 21 10/08/2016   CALCIUM 9.8 10/08/2016   CALCIUM 10.3 12/01/2015   CALCIUM 9.8 11/17/2015   CALCIUM 10.2 06/09/2015   CALCIUM 8.8 04/11/2014   CALCIUM 9.8 04/09/2014   CALCIUM 9.4 02/09/2014   CALCIUM 9.5 10/06/2013   CALCIUM 9.4 10/15/2012   CALCIUM 9.7 10/23/2011   His 24 hour urine calcium was normal: Component     Latest Ref Rng & Units 10/14/2016  Creatinine, Urine     20 - 370 mg/dL 121  Creatinine, 24H Ur     0.63 - 2.50 g/24 h 1.57  Calcium, Ur     Not estab mg/dL 23  Calcium, 24 hour urine     55 - 300 mg/24 h 299   No h/o thyrotoxicosis. Reviewed TSH  recent levels:  Lab Results  Component Value Date   TSH 1.26 08/13/2016   TSH 0.96 11/17/2015   TSH 1.14 02/09/2014   TSH 0.71 10/06/2013   TSH 1.29 10/15/2012   No h/o CKD. Last BUN/Cr: Lab Results  Component Value Date   BUN 14 10/08/2016   CREATININE 0.77 10/08/2016   Last visit, we checked him for multiple myeloma and celiac disease and these were negative: Component     Latest Ref Rng & Units 10/08/2016  Protein Urine Random     Not Estab. mg/dL 19.8  Albumin ELP, Urine     % 40.3  Alpha-1-Globulin, U     % 2.1  ALPHA-2-GLOBULIN, U     % 15.1  Beta Globulin, U     % 26.9  Gamma Globulin, U     % 15.7  M Component, Ur     Not Observed % Not Observed  Please Note:      Comment  Antigliadin Abs, IgA     0 - 19 units 2  Transglutaminase IgA     0 - 3 U/mL <2  IgA/Immunoglobulin A, Serum     90 - 386 mg/dL 140   ROS: Constitutional: no weight gain/no weight loss, no fatigue, no subjective hyperthermia, no subjective hypothermia Eyes: no blurry vision, no xerophthalmia ENT: no sore throat, no nodules palpated in throat, no dysphagia, no odynophagia, no hoarseness Cardiovascular: no CP/no SOB/no palpitations/no leg swelling Respiratory: no cough/no SOB/no wheezing Gastrointestinal: no N/no V/no D/no C/no acid reflux Musculoskeletal: no muscle aches/no joint aches Skin: no rashes, no hair loss Neurological: no tremors/no numbness/no tingling/no dizziness  I reviewed pt's medications, allergies, PMH, social hx, family hx, and changes were documented in the history of present illness. Otherwise, unchanged from my initial visit note.  Past Medical History:  Diagnosis Date  . Abdominal pain 12/01/2015  . Asthma   . Blood in urine   . Diabetes mellitus   . GERD (gastroesophageal reflux disease)   . Hyperlipidemia    notes only hx of hyperlipidemia  . Loss of weight 12/06/2015  . MR (mental retardation), moderate   . MRSA (methicillin resistant staph aureus) culture  positive   . Osteoporosis 08/18/2016  . Routine general medical examination at a health care facility 10/15/2012   Past Surgical History:  Procedure Laterality Date  . HIP SURGERY     Left  . INTRAMEDULLARY (IM) NAIL INTERTROCHANTERIC Left 04/10/2014  . INTRAMEDULLARY (IM) NAIL INTERTROCHANTERIC Left 04/10/2014   Procedure: INTRAMEDULLARY (IM) NAIL INTERTROCHANTRIC LEFT HIP;  Surgeon: Renette Butters, MD;  Location: [place];  Service: Orthopedics;  Laterality: Left;   Social History   Social History  . Marital status: Single    Spouse name: N/A  . Number of children: 0   Occupational History  . disabled   Social History Main Topics  . Smoking status: Never Smoker  . Smokeless tobacco: Never Used  . Alcohol use No  . Drug use: No   Current Outpatient Prescriptions on File Prior to Visit  Medication Sig Dispense Refill  . ACCU-CHEK AVIVA PLUS test strip 1 each by Other route 2 (two) times daily. 100 each 12  . ACCU-CHEK FASTCLIX LANCETS MISC Check blood sugars 2 times daily PRN 306 each 12  . acetaminophen (TYLENOL) 500 MG tablet Take 500 mg by mouth every 4 (four) hours as needed (pain; fever >100).     Marland Kitchen albuterol (PROVENTIL HFA;VENTOLIN HFA) 108 (90 BASE) MCG/ACT inhaler Inhale 2 puffs into the lungs 4 (four) times daily as needed for wheezing or shortness of breath. Shortness of breath/wheezing    . Calcium Carbonate (CALCIUM 600 PO) Take by mouth 2 (two) times daily.    . Cholecalciferol (VITAMIN D) 2000 units CAPS Take by mouth.    . diphenhydrAMINE (BENADRYL) 25 MG tablet Take 25-50 mg by mouth every 6 (six) hours as needed (allergic reaction). Take 2 tablets (50 mg) for ingestion of shellfish and seek emergency help.    . docusate sodium (COLACE) 100 MG capsule Take 1 capsule (100 mg total) by mouth 2 (two) times daily. Continue this while taking narcotics to help with bowel movements (Patient taking differently: Take 100 mg by mouth 2 (two) times daily. Continue this while  taking narcotics to help with bowel movements  PRN) 30 capsule 1  . fluticasone (FLONASE) 50 MCG/ACT nasal spray Place 1 spray into both nostrils daily as needed for allergies or rhinitis (PRN).     Marland Kitchen glimepiride (AMARYL) 1 MG tablet Take 1 tablet (1 mg total) by mouth daily with breakfast. 30 tablet 5  . guaiFENesin-dextromethorphan (ROBITUSSIN DM) 100-10 MG/5ML syrup Take 15 mLs by mouth every 4 (four) hours as needed for cough (cold symptoms). Reported on 08/17/2015    . ibuprofen (ADVIL,MOTRIN) 200 MG tablet Take 200 mg by mouth every 4 (four) hours as needed.    . loperamide (IMODIUM A-D) 2 MG tablet Take 2 mg by mouth as needed for diarrhea or loose stools. Take 2 caplets  or 4 tsp (20 mls) liquid by mouth after first loose stool and 1 caplet or 1 tsp (5 mls) liquid by mouth for each subsequent stool as needed; not to exceed 8 caplets or 50 mls in 24 hours    . loratadine (CLARITIN) 10 MG tablet Take 1 tablet (10 mg total) by mouth daily. 30 tablet 11  . losartan (COZAAR) 25 MG tablet TAKE 1 TABLET BY MOUTH ONCE DAILY. 31 tablet 6  . metFORMIN (GLUCOPHAGE) 1000 MG tablet TAKE 1 TABLET BY MOUTH TWICE DAILY WITH A MEAL. 60 tablet 6  . montelukast (SINGULAIR) 10 MG tablet TAKE 1 TABLET BY MOUTH ONCE DAILY. PRN 31 tablet 6  . ranitidine (ZANTAC) 300 MG tablet Take 1 tablet (300 mg total) by mouth at bedtime. 30 tablet 5  . saxagliptin  HCl (ONGLYZA) 5 MG TABS tablet Take 1 tablet (5 mg total) by mouth daily. 30 tablet 3   No current facility-administered medications on file prior to visit.    Allergies  Allergen Reactions  . Shellfish Allergy Anaphylaxis  . Iohexol     IVP Dye   Family History  Problem Relation Age of Onset  . Alcohol abuse Father   . Cancer Maternal Grandmother   . Hyperlipidemia Maternal Grandmother   . Heart disease Maternal Grandmother   . Hypertension Maternal Grandmother   . Heart disease Maternal Grandfather   . Hypertension Maternal Grandfather   .  Hyperlipidemia Maternal Grandfather   . Hyperlipidemia Paternal Grandmother   . Heart disease Paternal Grandmother   . Hypertension Paternal Grandmother   . Kidney disease Paternal Grandmother   . Diabetes Paternal Grandmother   . Heart disease Paternal Grandfather   . Hypertension Paternal Grandfather   . Hyperlipidemia Paternal Grandfather    Pt has FH of DM in father and Paternal GPs.  PE: BP 110/78 (BP Location: Left Arm, Patient Position: Sitting)   Pulse 85   Wt 140 lb (63.5 kg)   SpO2 96%   BMI 21.93 kg/m  Wt Readings from Last 3 Encounters:  10/21/16 140 lb (63.5 kg)  10/08/16 142 lb (64.4 kg)  08/13/16 136 lb 9.6 oz (62 kg)   Constitutional: thin, in NAD Eyes: PERRLA, EOMI, no exophthalmos ENT: moist mucous membranes, no thyromegaly, no cervical lymphadenopathy Cardiovascular: RRR, No MRG Respiratory: CTA B Gastrointestinal: abdomen soft, NT, ND, BS+ Musculoskeletal: kyphosis, scoliosis, hunched posture Skin: moist, warm, no rashes Neurological: no tremor with outstretched hands, DTR normal in all 4  ASSESSMENT: 1. DM1.5, insulin-dependent, uncontrolled, without long term complications  2. Low BMD for age  PLAN:  1. Patient with recently more uncontrolled diabetes, with positive GAD antibodies. Therefore, his most likely diagnosis is diabetes type 1.5 or even type, in the honeymoon period. I believe this is probably the reason why she lost weight, so patient and his back to discussed about his new diagnosis and to start long-acting insulin. They agree with this. We also discussed about stopping Amaryl to protect his pancreatic beta cells. He sugars in the morning are still high, so I think using with this. We'll start at a low dose, 6 units daily. - Discussed correct injection techniques - Demonstrated pen use - Reviewed his lost HbA1c, which was high, at 8.8% - I suggested to:  Patient Instructions  Please start: - Lantus 6 units at bedtime (or after dinner,  whichever is easier)  Stop: - Amaryl  Continue: - Metformin 1000 mg 2x a day, with meals - Onglyza 5 mg daily in am  Please return in August with your sugar log.   - continue checking sugars at different times of the day - check 1x a day, rotating checks - advised for yearly eye exams >> he is UTD - Return to clinic in 3 mo with sugar log     2. Low BMD for age - pt had a hip fx while roller skating in 2015 >> a DEXA scan showed mild low BMD for age. He was started on calcium and vitamin D at that time.  - Repeat DEXA scan this year showed worsening Z scores - Recent vitamin D level was normal and he does not have evidence of kidney disease, thyroid disease, hypophosphatasia anemia - at last visit, we  also ruled out: Celiac ds., Multiple myeloma, HPTH, 24 hydroxylase deficiency, calcium  malabsorption - Due to his fracture I suggested to start Fosamax weekly - discussed correct administration, discussed about possible side effects: Osteonecrosis of the jaw (no major dental workup), atypical fractures. - We will plan to repeat DEXA scan in 2 years after starting Fosamax and decide at that time with that we need to continue or not  Patient Instructions  Start: - Fosamax 70 mg weekly in am, with a full glass of water  - time spent with the patient: 40 min, of which >50% was spent in obtaining information about his symptoms, reviewing his previous labs, evaluations, and treatments, counseling him about his conditions (please see the discussed topics above),  demonstrating pen use and correct injection techniques;  His aunt had a number of questions which I addressed.  Philemon Kingdom, MD PhD [facility] Endocrinology

## 2016-11-20 ENCOUNTER — Other Ambulatory Visit: Payer: Self-pay

## 2017-01-13 ENCOUNTER — Ambulatory Visit: Payer: Self-pay | Admitting: Internal Medicine

## 2017-01-28 ENCOUNTER — Encounter: Payer: Self-pay | Admitting: Internal Medicine

## 2017-01-28 ENCOUNTER — Telehealth: Payer: Self-pay | Admitting: Internal Medicine

## 2017-01-28 ENCOUNTER — Ambulatory Visit (INDEPENDENT_AMBULATORY_CARE_PROVIDER_SITE_OTHER): Payer: Medicare Other | Admitting: Internal Medicine

## 2017-01-28 ENCOUNTER — Other Ambulatory Visit: Payer: Self-pay

## 2017-01-28 VITALS — BP 122/80 | HR 88 | Wt 141.0 lb

## 2017-01-28 DIAGNOSIS — E0865 Diabetes mellitus due to underlying condition with hyperglycemia: Secondary | ICD-10-CM

## 2017-01-28 DIAGNOSIS — M8580 Other specified disorders of bone density and structure, unspecified site: Secondary | ICD-10-CM | POA: Diagnosis not present

## 2017-01-28 DIAGNOSIS — IMO0001 Reserved for inherently not codable concepts without codable children: Secondary | ICD-10-CM

## 2017-01-28 DIAGNOSIS — M859 Disorder of bone density and structure, unspecified: Secondary | ICD-10-CM

## 2017-01-28 LAB — POCT GLYCOSYLATED HEMOGLOBIN (HGB A1C): HEMOGLOBIN A1C: 7.5

## 2017-01-28 MED ORDER — GLIPIZIDE 5 MG PO TABS: 2.5000 mg | ORAL_TABLET | Freq: Every day | ORAL | 5 refills | Status: DC

## 2017-01-28 MED ORDER — INSULIN GLARGINE 100 UNIT/ML SOLOSTAR PEN: 8.0000 [IU] | PEN_INJECTOR | Freq: Every day | SUBCUTANEOUS | 11 refills | Status: DC

## 2017-01-28 MED ORDER — GLIPIZIDE 5 MG PO TABS: 2.5000 mg | ORAL_TABLET | Freq: Every day | ORAL | 1 refills | Status: DC

## 2017-01-28 NOTE — Progress Notes (Signed)
Patient ID: Adam Fitzpatrick, male   DOB: 1980-09-28, 36 y.o.   MRN: [id number]   HPI: GERRETT LOMAN [facility]) is a 36 y.o.-year-old male, returning for f/u for DM1.5, dx as DM2 in 2011, insulin-dependent, uncontrolled, without complications. He also has low BMD for age. He is here with his aunt who offers most of the history as patient is mostly nonverbal. Last visit 3 mo ago.  DM1.5: Last hemoglobin A1c was: Lab Results  Component Value Date   HGBA1C 8.8 (H) 08/13/2016   HGBA1C 7.4 (H) 11/17/2015   HGBA1C 9.0 (H) 06/09/2015   HGBA1C 6.5 02/09/2014   HGBA1C 8.2 (H) 10/06/2013   HGBA1C 6.1 10/15/2012   HGBA1C 6.2 10/23/2011   Pt was on a regimen of: - Metformin 1000 mg 2x a day, with meals - Onglyza 5 mg daily in am - Amaryl 1 mg daily in am >> started 08/2016   At last visit, we changed to: - Lantus 6 units at bedtime - Metformin 1000 mg 2x a day, with meals - Onglyza 5 mg daily in am  He lost a significant amount of weight in last few years: 25-30 lbs. Now his weight has stabilized.  We investigated him for type I DM and GAD Abs were positive >> Type 1.5 DM Component     Latest Ref Rng & Units 10/08/2016  Glucose     65 - 99 mg/dL 73  Pancreatic Islet Cell Antibody     <5 JDF Units <5  Glutamic Acid Decarb Ab     <5 IU/mL 14 (H)  C-Peptide     0.80 - 3.85 ng/mL 1.29   Pt checks his sugars 1x a day (at the group home): - am: 83-145, 164, 185 >> 165, 205 >> 108-152,200s  after a night when he eats out (every Thu and occasionally on Sat's) - 2h after b'fast: n/c >> 138, 145 >> n/c - before lunch: n/c >> 145 >> n/c - 2h after lunch: n/c >> 128 >> n/c - before dinner: n/c >> 138 >> n/c - 2h after dinner: n/c >> 150, 181 >> n/c - bedtime: n/c - nighttime: n/c No lows. Lowest sugar was 83 >> 108; ? has hypoglycemia awareness. Highest sugar was 360s (after banana pudding), OTW 200s >> 235   Glucometer: AccuChek [employer]iva  Pt's meals are: - Breakfast: oatmeal, eggs, bacon,  sausage, pancakes (sugarfree syrup) - Lunch: 2 PB sandwiches on wheat - Dinner: low carb - Snacks: before bedtime  - No CKD, last BUN/creatinine:  Lab Results  Component Value Date   BUN 14 10/08/2016   BUN 9 12/01/2015   CREATININE 0.77 10/08/2016   CREATININE 0.82 12/01/2015  He is on losartan - last set of lipids: Lab Results  Component Value Date   CHOL 143 08/13/2016   HDL 47.20 08/13/2016   LDLCALC 72 08/13/2016   TRIG 119.0 08/13/2016   CHOLHDL 3 08/13/2016  He is not on a statin - last eye exam was In 10/2016: No DR. Dr. Nancee Liter in [place]. - He denies numbness and tingling in his feet.  He also has low BMD for age:  Reviewed and addended history: Reviewed available DEXA scans:  Z-score Lumbar spine (L1-L4) Femoral neck (FN) 33% distal radius UD radius  08/13/2016 -2.6 RFN: -2.4 LFN: n/a -0.1 n/a  05/04/2014 -2.1 RFN: -2.2 +0.6 +0.1  No dizziness/vertigo/orthostasis.  He had a Left hip fracture in 2015 [place] skating ([employer]).  He does not have a history of vitamin  D deficiency h/o vitamin D deficiency. Reviewed available vit D levels: Lab Results  Component Value Date   VD25OH 72.84 11/17/2015   1, 25 dihydroxy vitamin D level was normal: Component     Latest Ref Rng & Units 10/08/2016  Vitamin D 1, 25 (OH) Total     18 - 72 pg/mL 31  Vitamin D3 1, 25 (OH)     pg/mL 31  Vitamin D2 1, 25 (OH)     pg/mL <8   Pt is on calcium 600 mg bid and vitamin D 2000 IU.   No known h/o celiac disease, multiple myeloma.  No weight bearing exercises.   He is not taking high vitamin A doses.  Pt does have a FH of osteoporosis: MGM.  No history of hyper or hypocalcemia or hyperparathyroidism. No h/o kidney stones. Lab Results  Component Value Date   PTH 21 10/08/2016   CALCIUM 9.8 10/08/2016   CALCIUM 10.3 12/01/2015   CALCIUM 9.8 11/17/2015   CALCIUM 10.2 06/09/2015   CALCIUM 8.8 04/11/2014   CALCIUM 9.8 04/09/2014   CALCIUM 9.4 02/09/2014    CALCIUM 9.5 10/06/2013   CALCIUM 9.4 10/15/2012   CALCIUM 9.7 10/23/2011   24-hour urine calcium was normal: Component     Latest Ref Rng & Units 10/14/2016  Creatinine, Urine     20 - 370 mg/dL 121  Creatinine, 24H Ur     0.63 - 2.50 g/24 h 1.57  Calcium, Ur     Not estab mg/dL 23  Calcium, 24 hour urine     55 - 300 mg/24 h 299   No history of thyrotoxicosis. Reviewed TSH recent levels:  Lab Results  Component Value Date   TSH 1.26 08/13/2016   TSH 0.96 11/17/2015   TSH 1.14 02/09/2014   TSH 0.71 10/06/2013   TSH 1.29 10/15/2012   No history of CKD. Last BUN/Cr: Lab Results  Component Value Date   BUN 14 10/08/2016   CREATININE 0.77 10/08/2016   At a previous visit, we checked him for multiple myeloma and celiac disease and these were negative: Component     Latest Ref Rng & Units 10/08/2016  Protein Urine Random     Not Estab. mg/dL 19.8  Albumin ELP, Urine     % 40.3  Alpha-1-Globulin, U     % 2.1  ALPHA-2-GLOBULIN, U     % 15.1  Beta Globulin, U     % 26.9  Gamma Globulin, U     % 15.7  M Component, Ur     Not Observed % Not Observed  Please Note:      Comment  Antigliadin Abs, IgA     0 - 19 units 2  Transglutaminase IgA     0 - 3 U/mL <2  IgA/Immunoglobulin A, Serum     90 - 386 mg/dL 140   ROS: Constitutional: no weight gain/no weight loss, no fatigue, no subjective hyperthermia, no subjective hypothermia Eyes: no blurry vision, no xerophthalmia ENT: no sore throat, no nodules palpated in throat, no dysphagia, no odynophagia, no hoarseness Cardiovascular: no CP/no SOB/no palpitations/no leg swelling Respiratory: no cough/no SOB/no wheezing Gastrointestinal: no N/no V/no D/no C/no acid reflux Musculoskeletal: no muscle aches/no joint aches Skin: no rashes, no hair loss Neurological: no tremors/no numbness/no tingling/no dizziness  I reviewed pt's medications, allergies, PMH, social hx, family hx, and changes were documented in the history of  present illness. Otherwise, unchanged from my initial visit note.  Past Medical History:  Diagnosis Date  . Abdominal pain 12/01/2015  . Asthma   . Blood in urine   . Diabetes mellitus   . GERD (gastroesophageal reflux disease)   . Hyperlipidemia    notes only hx of hyperlipidemia  . Loss of weight 12/06/2015  . MR (mental retardation), moderate   . MRSA (methicillin resistant staph aureus) culture positive   . Osteoporosis 08/18/2016  . Routine general medical examination at a health care facility 10/15/2012   Past Surgical History:  Procedure Laterality Date  . HIP SURGERY     Left  . INTRAMEDULLARY (IM) NAIL INTERTROCHANTERIC Left 04/10/2014  . INTRAMEDULLARY (IM) NAIL INTERTROCHANTERIC Left 04/10/2014   Procedure: INTRAMEDULLARY (IM) NAIL INTERTROCHANTRIC LEFT HIP;  Surgeon: Renette Butters, MD;  Location: [facility];  Service: Orthopedics;  Laterality: Left;   Social History   Social History  . Marital status: Single    Spouse name: N/A  . Number of children: 0   Occupational History  . disabled   Social History Main Topics  . Smoking status: Never Smoker  . Smokeless tobacco: Never Used  . Alcohol use No  . Drug use: No   Current Outpatient Prescriptions on File Prior to Visit  Medication Sig Dispense Refill  . ACCU-CHEK AVIVA PLUS test strip 1 each by Other route 2 (two) times daily. 100 each 12  . ACCU-CHEK FASTCLIX LANCETS MISC Check blood sugars 2 times daily PRN 306 each 12  . acetaminophen (TYLENOL) 500 MG tablet Take 500 mg by mouth every 4 (four) hours as needed (pain; fever >100).     Marland Kitchen albuterol (PROVENTIL HFA;VENTOLIN HFA) 108 (90 BASE) MCG/ACT inhaler Inhale 2 puffs into the lungs 4 (four) times daily as needed for wheezing or shortness of breath. Shortness of breath/wheezing    . alendronate (FOSAMAX) 70 MG tablet Take 1 tablet (70 mg total) by mouth every 7 (seven) days. Take with a full glass of water on an empty stomach. 15 tablet 3  . Calcium Carbonate  (CALCIUM 600 PO) Take by mouth 2 (two) times daily.    . Cholecalciferol (VITAMIN D) 2000 units CAPS Take by mouth.    . diphenhydrAMINE (BENADRYL) 25 MG tablet Take 25-50 mg by mouth every 6 (six) hours as needed (allergic reaction). Take 2 tablets (50 mg) for ingestion of shellfish and seek emergency help.    . docusate sodium (COLACE) 100 MG capsule Take 1 capsule (100 mg total) by mouth 2 (two) times daily. Continue this while taking narcotics to help with bowel movements (Patient taking differently: Take 100 mg by mouth 2 (two) times daily. Continue this while taking narcotics to help with bowel movements  PRN) 30 capsule 1  . fluticasone (FLONASE) 50 MCG/ACT nasal spray Place 1 spray into both nostrils daily as needed for allergies or rhinitis (PRN).     Marland Kitchen guaiFENesin-dextromethorphan (ROBITUSSIN DM) 100-10 MG/5ML syrup Take 15 mLs by mouth every 4 (four) hours as needed for cough (cold symptoms). Reported on 08/17/2015    . ibuprofen (ADVIL,MOTRIN) 200 MG tablet Take 200 mg by mouth every 4 (four) hours as needed.    . Insulin Glargine (LANTUS SOLOSTAR) 100 UNIT/ML Solostar Pen Inject 6 Units into the skin daily at 10 pm. 5 pen 11  . Insulin Pen Needle (CAREFINE PEN NEEDLES) 32G X 4 MM MISC Use 1x a day 100 each 3  . loperamide (IMODIUM A-D) 2 MG tablet Take 2 mg by mouth as needed for diarrhea or loose stools. Take 2 caplets  or 4 tsp (20 mls) liquid by mouth after first loose stool and 1 caplet or 1 tsp (5 mls) liquid by mouth for each subsequent stool as needed; not to exceed 8 caplets or 50 mls in 24 hours    . loratadine (CLARITIN) 10 MG tablet Take 1 tablet (10 mg total) by mouth daily. 30 tablet 11  . losartan (COZAAR) 25 MG tablet TAKE 1 TABLET BY MOUTH ONCE DAILY. 31 tablet 6  . metFORMIN (GLUCOPHAGE) 1000 MG tablet TAKE 1 TABLET BY MOUTH TWICE DAILY WITH A MEAL. 60 tablet 6  . montelukast (SINGULAIR) 10 MG tablet TAKE 1 TABLET BY MOUTH ONCE DAILY. PRN 31 tablet 6  . ranitidine (ZANTAC)  300 MG tablet Take 1 tablet (300 mg total) by mouth at bedtime. 30 tablet 5  . saxagliptin HCl (ONGLYZA) 5 MG TABS tablet Take 1 tablet (5 mg total) by mouth daily. 30 tablet 3   No current facility-administered medications on file prior to visit.    Allergies  Allergen Reactions  . Shellfish Allergy Anaphylaxis  . Iohexol     IVP Dye   Family History  Problem Relation Age of Onset  . Alcohol abuse Father   . Cancer Maternal Grandmother   . Hyperlipidemia Maternal Grandmother   . Heart disease Maternal Grandmother   . Hypertension Maternal Grandmother   . Heart disease Maternal Grandfather   . Hypertension Maternal Grandfather   . Hyperlipidemia Maternal Grandfather   . Hyperlipidemia Paternal Grandmother   . Heart disease Paternal Grandmother   . Hypertension Paternal Grandmother   . Kidney disease Paternal Grandmother   . Diabetes Paternal Grandmother   . Heart disease Paternal Grandfather   . Hypertension Paternal Grandfather   . Hyperlipidemia Paternal Grandfather    Pt has FH of DM in father and Paternal GPs.  PE: BP 122/80 (BP Location: Left Arm, Patient Position: Sitting)   Pulse 88   Wt 141 lb (64 kg)   SpO2 97%   BMI 22.08 kg/m  Wt Readings from Last 3 Encounters:  01/28/17 141 lb (64 kg)  10/21/16 140 lb (63.5 kg)  10/08/16 142 lb (64.4 kg)   Constitutional: thin, in NAD Eyes: PERRLA, EOMI, no exophthalmos ENT: moist mucous membranes, no thyromegaly, no cervical lymphadenopathy Cardiovascular: RRR, No MRG Respiratory: CTA B Gastrointestinal: abdomen soft, NT, ND, BS+ Musculoskeletal: no deformities, strength intact in all 4 Skin: moist, warm, no rashes Neurological: no tremor with outstretched hands, DTR normal in all 4 ASSESSMENT: 1. DM1.5, insulin-dependent, uncontrolled, without long term complications  2. Low BMD for age  PLAN:  1. Patient with recently more uncontrolled diabetes, with positive GAD antibodies. He most likely has diabetes type  1.5 or even type I in honeymoon period. We started insulin at last visit, very low dose, since his most likely very insulin sensitive. He continues on 6 units of Lantus at bedtime. At this visit, they bring the log from his group home which sugars checked once a day in the morning.  - Sugars are mostly at goal, except after a night when he eats out. We discussed about adding a low-dose glipizide before these meals. Otherwise, I did advise him to increase the Lantus to 8 units to help more with the sugars in the morning. I did not advise them to check sugars more than once a day for now, we may need to rotate the checks at next visit, however, this is difficult, since he is in the group home  - I  suggested to:  Patient Instructions  Please continue: - Metformin 1000 mg 2x a day, with meals - Onglyza 5 mg daily in am  Please increase: - Lantus to 8 units at bedtime  Please use: - Glipizide 2.5 mg immediately before a large dinner (if you eat out)  Please call if sugars <80 consistently.  Please continue Fosamax 70 mg weekly.  Please return in 3 months with your sugar log.    - today, HbA1c is 7.5% (better!) - continue checking sugars at different times of the day - check 1x a day, rotating checks - advised for yearly eye exams >> he is UTD - Return to clinic in 3 mo with sugar log      2. Low BMD for age - pt had a hip fx while roller skating in 2015 >> a DEXA scan showed mild low BMD for age. He was started on calcium and vitamin D at that time.  - a repeat DEXA scan this year showed worsening Z scores - Recent vitamin D level was normal and he does not have evidence of kidney disease, thyroid disease, hypophosphatasia, anemia and we also ruled out celiac disease, multiple myeloma, hyperparathyroidism, 24 hydroxylase deficiency, calcium malabsorption. - Due to his history of fracture, and the declining Z scores, I suggested to start Fosamax weekly, and he agreed to start. We did discuss  at last visit about possible side effects: Osteonecrosis of the jaw and atypical fractures. He does not have any jaw pain or hip pain. He tells me he has a chipped tooth and will have a root canal. I advised him that this is ok. - We'll plan to repeat the DEXA scan in 2 years after starting Fosamax and decided that time whether we need to continue the medication or not  Philemon Kingdom, MD PhD [facility] Endocrinology

## 2017-01-28 NOTE — Telephone Encounter (Signed)
Submitted

## 2017-01-28 NOTE — Patient Instructions (Addendum)
Please continue: - Metformin 1000 mg 2x a day, with meals - Onglyza 5 mg daily in am  Please increase: - Lantus to 8 units at bedtime  Please use: - Glipizide 2.5 mg immediately before a large dinner (if you eat out)  Please call if sugars <80 consistently.  Please continue Fosamax 70 mg weekly.  Please return in 3 months with your sugar log.

## 2017-01-28 NOTE — Telephone Encounter (Signed)
[facility] [place], [place] to see if they can get a 30 day (15 tablets) supply for the glipiZIDE (GLUCOTROL) 5 MG tablet. Call to give verbal order.

## 2017-01-28 NOTE — Telephone Encounter (Signed)
Routing to you °

## 2017-01-28 NOTE — Addendum Note (Signed)
Addended by: Caprice Beaver T on: 01/28/2017 02:09 PM   Modules accepted: Orders

## 2017-02-21 ENCOUNTER — Telehealth: Payer: Self-pay | Admitting: Family Medicine

## 2017-02-21 NOTE — Telephone Encounter (Signed)
Pt's mother dropped off document to be filled out by provider ([employer] Physical Document- 2pages- in a big yellow envelope) Mother would like to be called when document ready at [phone]. Document put at front office tray.

## 2017-02-26 NOTE — Telephone Encounter (Signed)
Patient has not had a CPE this year, was to call and schedule after 08/16/16; cannot complete form; forwarding to provider [out of office today] to get directions on how to proceed/SLS 09/26

## 2017-02-26 NOTE — Telephone Encounter (Signed)
I can fill out based on 08/13/16 visit. He has straight medicare so they do not coer a true CPE.

## 2017-02-28 NOTE — Telephone Encounter (Signed)
Filled out as much as I could he needs vision screen I spoke with his POA and she stated she will call his eye doc to see if they can fax over his info.  PC

## 2017-03-03 ENCOUNTER — Other Ambulatory Visit: Payer: Self-pay | Admitting: Family Medicine

## 2017-03-03 NOTE — Telephone Encounter (Signed)
Pt advised on 3.13.18 to F/U in 3 months/note to pharm to advise pt to sched appt first/thx dmf

## 2017-03-04 ENCOUNTER — Other Ambulatory Visit: Payer: Self-pay | Admitting: Family Medicine

## 2017-03-04 DIAGNOSIS — Z23 Encounter for immunization: Secondary | ICD-10-CM | POA: Diagnosis not present

## 2017-03-04 NOTE — Telephone Encounter (Signed)
Pt advised in March to F/U in 3 months/asked pharm to advise pt to plz sched appt first/thx dmf

## 2017-03-04 NOTE — Telephone Encounter (Signed)
Spoke with patients caregiver. I asked about form we have, it states we need an eye exam she states she will fax over form form his opthalmology   [name] have you seen this form  Please advise

## 2017-03-05 NOTE — Telephone Encounter (Signed)
No, I have not seen form as of yet/SLS 10/03

## 2017-04-07 IMAGING — MR MR ABDOMEN WO/W CM
13 of 22 series · 20 of 48 positions shown · IV contrast (multihance)
Comparison: Ultrasound 12/12/2015, CT 11/26/2011

CLINICAL DATA: Weight loss, indeterminate lesion in the pancreas on
ultrasound.

EXAM:
MRI ABDOMEN WITHOUT AND WITH CONTRAST
TECHNIQUE: Multiplanar multisequence MR imaging of the abdomen was performed
both before and after the administration of intravenous contrast.
CONTRAST:  12mL MULTIHANCE GADOBENATE DIMEGLUMINE 529 MG/ML IV SOLN

[Series 3: cor ssfse rt · coronal · 6.0mm · 0.74mm/px · 1 of 29 slices shown]
[im 1/29]
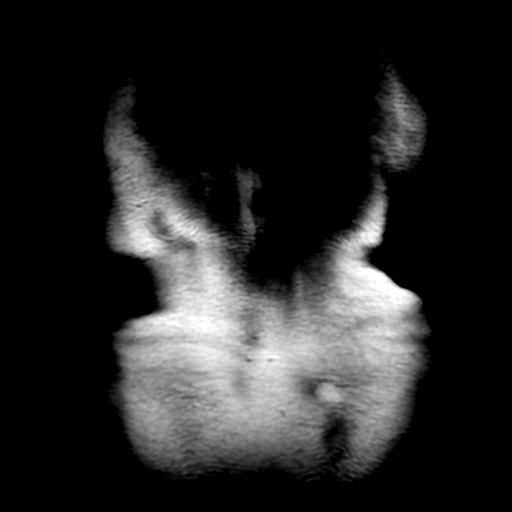

[Series 5: ax ssfse rt · axial · 6.0mm · 0.74mm/px · 1 of 40 slices shown]
[im 1/40]
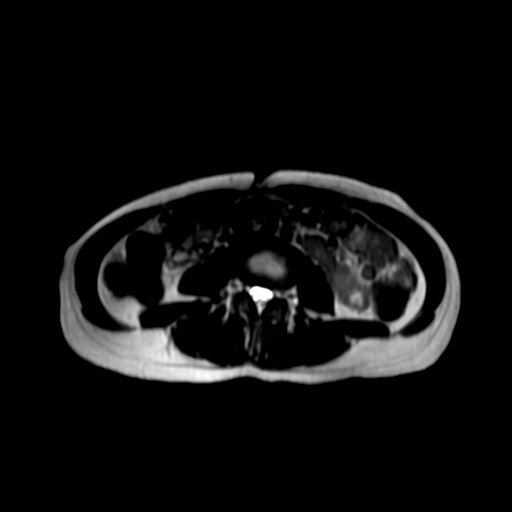

[Series 6: bSSFP · axial · 6.0mm · 0.74mm/px · 1 of 40 slices shown]
[im 1/40]
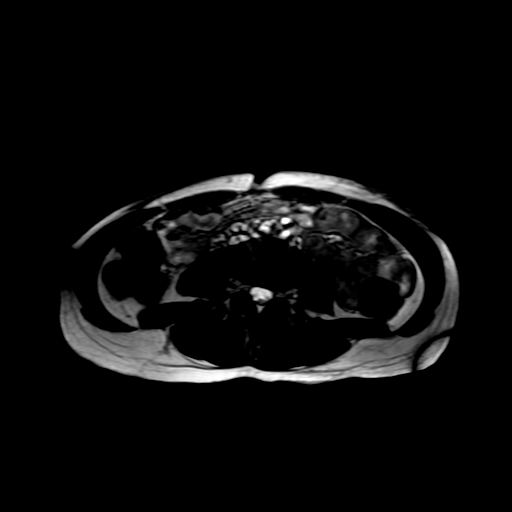

[Series 7: DWI b500 · axial · 8.0mm · 1.80mm/px · 1 of 48 slices shown]
[im 1/48]
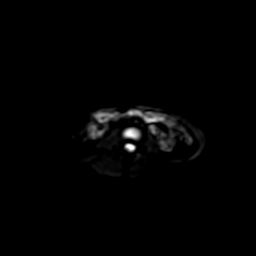

[Series 8: T2 fat-sat · axial · 6.0mm · 0.74mm/px · 1 of 40 slices shown]
[im 1/40]
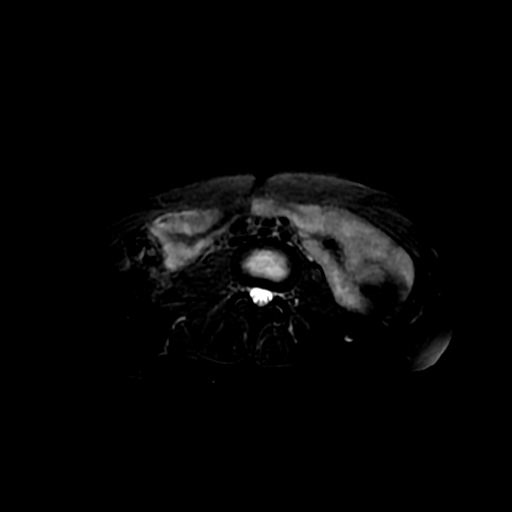

[Series 11: radial 2d thick · coronal · 50.0mm · 0.74mm/px · 1 of 5 slices shown]
[im 1/5]
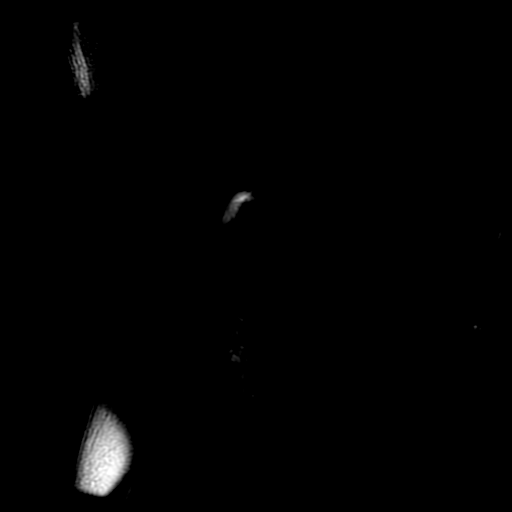

[Series 16: T1 dynamic · coronal · 4.0mm · 0.70mm/px · 2 of 56 slices shown (1 of 3)]
[im 1/56]
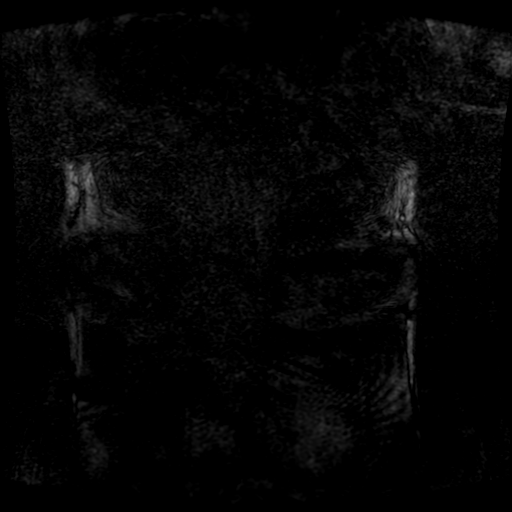
[im 56/56]
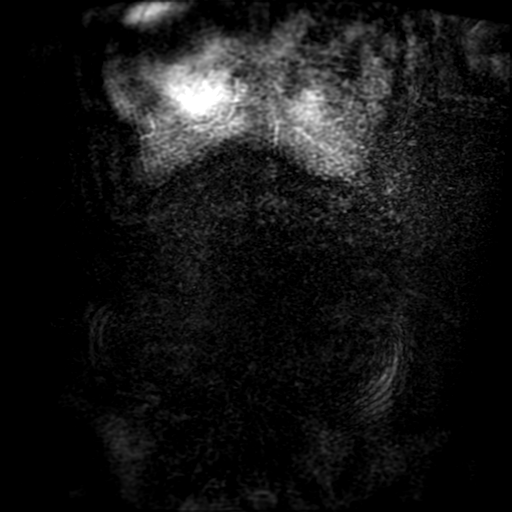

[Series 750: ADC · axial · 8.0mm · 1.80mm/px · 1 of 24 slices shown]
[im 1/24]
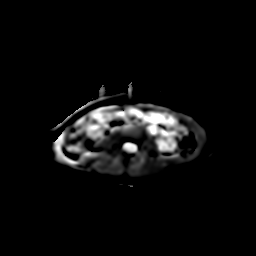

[Series 1050: processed images · sagittal · 1.8mm · 0.70mm/px · 1 of 12 slices shown (1 of 2)]
[im 1/12]
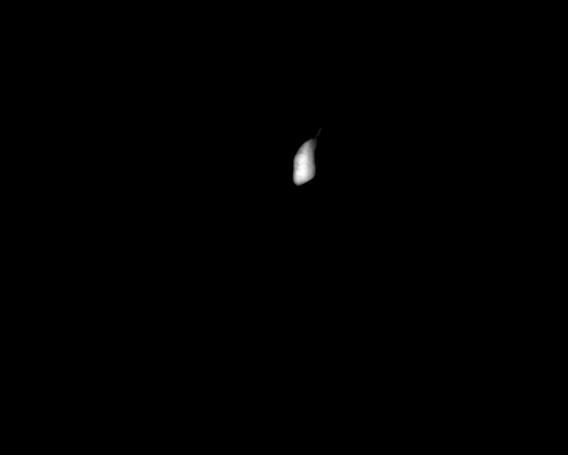

[Series 1051: processed images · axial · 1.8mm · 0.70mm/px · 1 of 11 slices shown (2 of 2)]
[im 1/11]
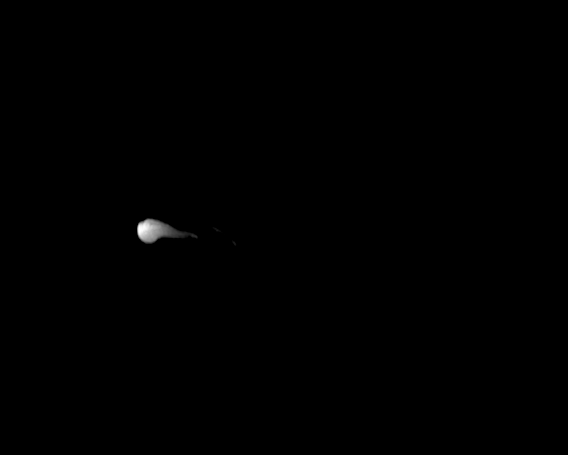

[Series 1401: T1 dynamic · axial · 4.0mm · 0.74mm/px · z∈[-128,+131]mm · 3 of 66 slices shown (2 of 3)]
[im 1/66]
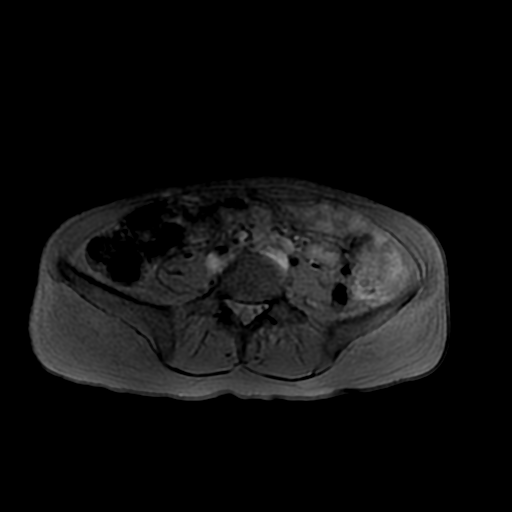
[im 33/66]
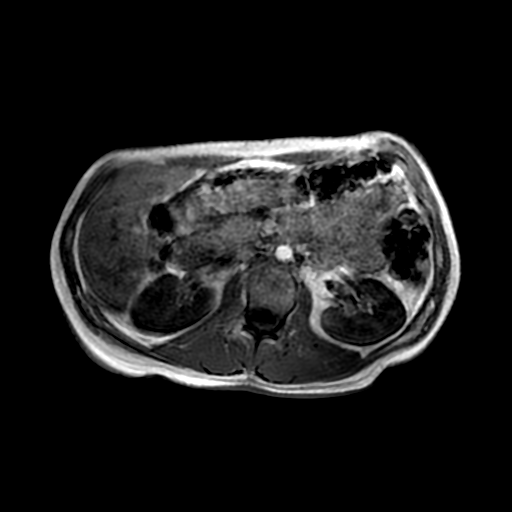
[im 66/66]
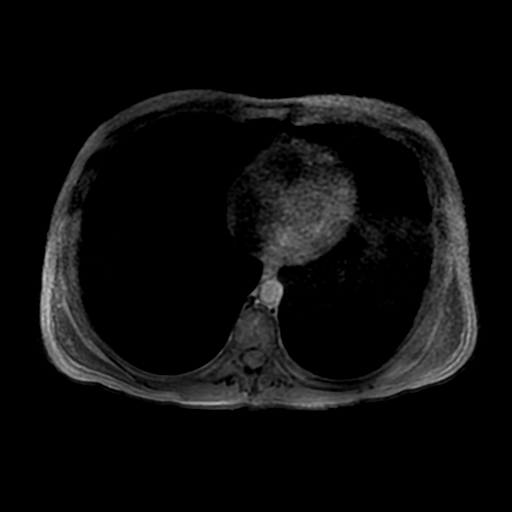

[Series 1402: T1 dynamic · axial · 4.0mm · 0.74mm/px · z∈[-128,+131]mm · 3 of 66 slices shown (3 of 3)]
[im 1/66]
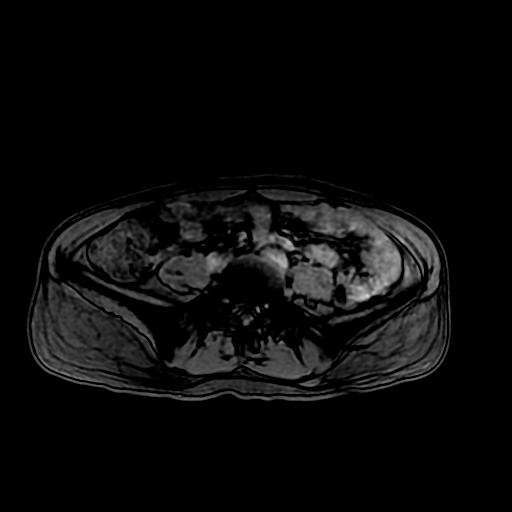
[im 33/66]
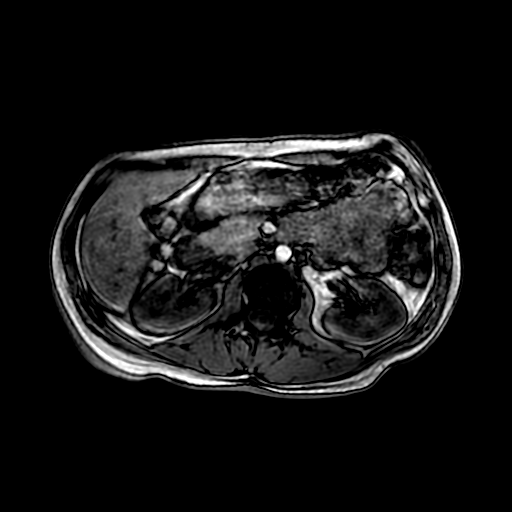
[im 66/66]
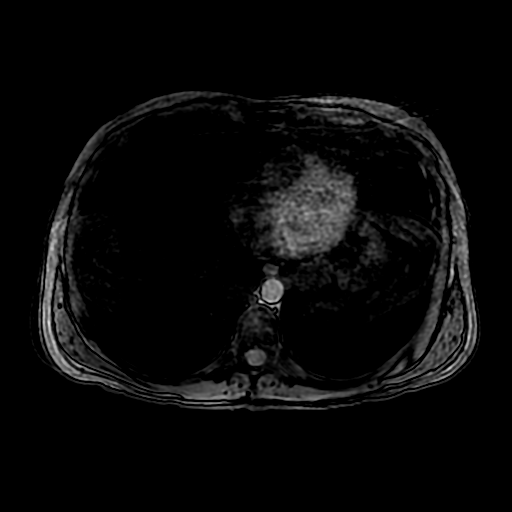

[Series 1500: T1 dynamic post-contrast · axial · non-contrast · 4.0mm · 0.74mm/px · z∈[-128,+131]mm · 3 of 66 slices shown]
[im 1/66]
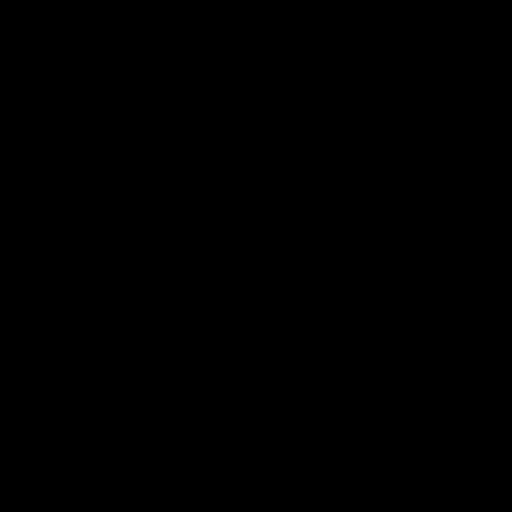
[im 33/66]
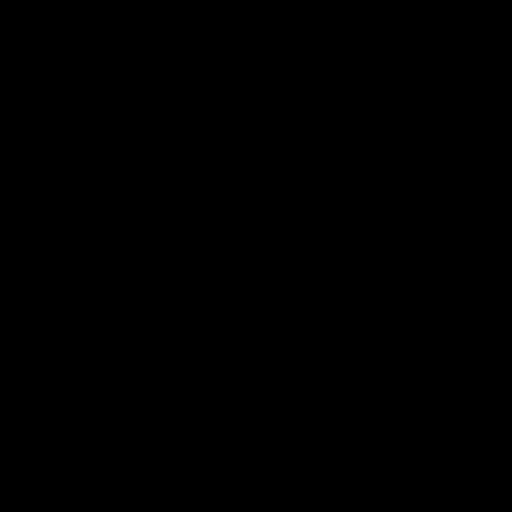
[im 66/66]
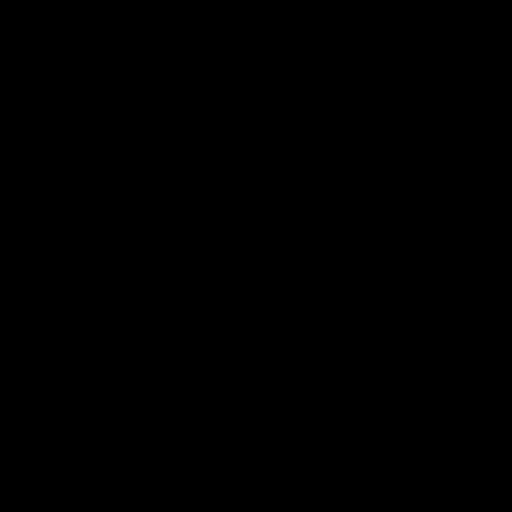

[20 of 48 positions shown; findings below may reference images not displayed]

FINDINGS: Lower chest: Lung bases are clear.

Hepatobiliary: Within the RIGHT hepatic lobe, there is a
well-circumscribed round lesion which is hyperintense on T2 weighted
imaging measuring 14 mm (image 7, series 5). Lesion has typical
enhancement pattern of hemangioma. No biliary duct dilatation.
Normal gallbladder. No hepatic steatosis.

Pancreas: Mild motion degradation of the T1 weighted imaging. No
focal pancreatic lesion identified. No duct dilatation.

Spleen: Normal spleen

Adrenals/urinary tract: Adrenal glands are normal. Benign cysts of
the LEFT kidney measuring 15 mm.

Stomach/Bowel: Stomach limited view of the small bowel is
unremarkable.

Vascular/Lymphatic: Abdominal aorta is normal caliber. There is no
retroperitoneal or periportal lymphadenopathy. No pelvic
lymphadenopathy.

Other: No free fluid.

Musculoskeletal: No aggressive osseous lesion.
IMPRESSION: 1. Benign hemangioma in the RIGHT hepatic lobe.
2. No biliary abnormality.  Normal common bile duct.
3. No pancreatic abnormality.
4. Bosniak 1 probable non cyst of the LEFT kidney.

## 2017-04-15 ENCOUNTER — Ambulatory Visit (INDEPENDENT_AMBULATORY_CARE_PROVIDER_SITE_OTHER): Payer: Medicare Other | Admitting: Internal Medicine

## 2017-04-15 ENCOUNTER — Encounter: Payer: Self-pay | Admitting: Internal Medicine

## 2017-04-15 VITALS — BP 120/80 | HR 90 | Wt 143.0 lb

## 2017-04-15 DIAGNOSIS — M8580 Other specified disorders of bone density and structure, unspecified site: Secondary | ICD-10-CM | POA: Diagnosis not present

## 2017-04-15 DIAGNOSIS — E0865 Diabetes mellitus due to underlying condition with hyperglycemia: Secondary | ICD-10-CM

## 2017-04-15 DIAGNOSIS — M859 Disorder of bone density and structure, unspecified: Secondary | ICD-10-CM

## 2017-04-15 DIAGNOSIS — IMO0001 Reserved for inherently not codable concepts without codable children: Secondary | ICD-10-CM

## 2017-04-15 LAB — POCT GLYCOSYLATED HEMOGLOBIN (HGB A1C): Hemoglobin A1C: 7.5

## 2017-04-15 MED ORDER — INSULIN GLARGINE 100 UNIT/ML SOLOSTAR PEN: 10.0000 [IU] | PEN_INJECTOR | Freq: Every day | SUBCUTANEOUS | 11 refills | Status: DC

## 2017-04-15 NOTE — Progress Notes (Signed)
Patient ID: [name], adult   DOB: 05-13-1981, 36 y.o.   MRN: [id number]   HPI: Adam Fitzpatrick) is a 36 y.o.-year-old adult, returning for f/u for DM1.5, dx as DM2 in 2011, insulin-dependent, uncontrolled, without complications. He also has low BMD for age. He is here with his aunt who offers most of the history as patient is mostly nonverbal. Last visit 3 months ago.  He has a URI >> cough for 2 weeks. Sugars higher.  DM1.5: Last hemoglobin A1c was: Lab Results  Component Value Date   HGBA1C 7.5 01/28/2017   HGBA1C 8.8 (H) 08/13/2016   HGBA1C 7.4 (H) 11/17/2015   HGBA1C 9.0 (H) 06/09/2015   HGBA1C 6.5 02/09/2014   HGBA1C 8.2 (H) 10/06/2013   HGBA1C 6.1 10/15/2012   HGBA1C 6.2 10/23/2011   He is on: - Lantus 6 >> 8 units at bedtime - Metformin 1000 mg 2x a day, with meals - Onglyza 5 mg daily in am - Glipizide 2.5 mg before large dinner -  when he eats out (every Thu and occasionally on Sat's)  Pt checks his sugars 1x a day (at the group home) - pt was not given the log today, but her caregiver remembers: - am: 108-152,200s after night when he eats out >> 170-200 now in last 2 weeks - 2h after b'fast: n/c >> 138, 145 >> n/c - before lunch: n/c >> 145 >> n/c - 2h after lunch: n/c >> 128 >> n/c - before dinner: n/c >> 138 >> n/c - 2h after dinner: n/c >> 150, 181 >> n/c - bedtime: n/c - nighttime: n/c Lowest sugar was 83 >> 108 >> 170; ? if hypoglycemia awareness. Highest sugar was 360s (after banana pudding), OTW 200s >> 235 >> 230.  Glucometer: AccuChek Aviva  Pt's meals are: - Breakfast: oatmeal, eggs, bacon, sausage, pancakes (sugarfree syrup) - Lunch: 2 PB sandwiches on wheat - Dinner: low carb - Snacks: before bedtime He lost a significant amount of weight in last few years: 25-30 lbs-  now his weight stabilized after we started insulin.  -No CKD, last BUN/creatinine:  Lab Results  Component Value Date   BUN 14 10/08/2016   BUN 9 12/01/2015    CREATININE 0.77 10/08/2016   CREATININE 0.82 12/01/2015  On losartan - last set of lipids: Lab Results  Component Value Date   CHOL 143 08/13/2016   HDL 47.20 08/13/2016   LDLCALC 72 08/13/2016   TRIG 119.0 08/13/2016   CHOLHDL 3 08/13/2016  He is not on a statin. - last eye exam was In 10/2016: No DR. Dr. Nancee Liter in [place]. - He denies numbness and tingling in his feet.  He also has low BMD for age:  Reviewed and addended history: Reviewed available DEXA scans: Z-score Lumbar spine (L1-L4) Femoral neck (FN) 33% distal radius UD radius  08/13/2016 -2.6 RFN: -2.4 LFN: n/a -0.1 n/a  05/04/2014 -2.1 RFN: -2.2 +0.6 +0.1  No dizziness/vertigo/orthostasis.  He had a Left hip fracture in 2015 [place] skating ([employer]).  He does not have a history of vitamin D deficiency h/o vitamin D deficiency. Reviewed available vit D levels: Lab Results  Component Value Date   VD25OH 72.84 11/17/2015   1, 25 dihydroxy vitamin D level was normal: Component     Latest Ref Rng & Units 10/08/2016  Vitamin D 1, 25 (OH) Total     18 - 72 pg/mL 31  Vitamin D3 1, 25 (OH)     pg/mL reviewed and  addended history: 31  Vitamin D2 1, 25 (OH)     pg/mL <8   Pt continues on calcium 600 mg bid and vitamin D 2000 IU.   No known h/o celiac disease, multiple myeloma.  No weight bearing exercises.   He is not taking high vitamin A doses.  Pt does have a FH of osteoporosis: MGM.  No history of hyper or hypocalcemia or hyperparathyroidism; no history of kidney stones. Lab Results  Component Value Date   PTH 21 10/08/2016   CALCIUM 9.8 10/08/2016   CALCIUM 10.3 12/01/2015   CALCIUM 9.8 11/17/2015   CALCIUM 10.2 06/09/2015   CALCIUM 8.8 04/11/2014   CALCIUM 9.8 04/09/2014   CALCIUM 9.4 02/09/2014   CALCIUM 9.5 10/06/2013   CALCIUM 9.4 10/15/2012   CALCIUM 9.7 10/23/2011   24-hour urine calcium was normal: Component     Latest Ref Rng & Units 10/14/2016  Creatinine, Urine     20 - 370  mg/dL 121  Creatinine, 24H Ur     0.63 - 2.50 g/24 h 1.57  Calcium, Ur     Not estab mg/dL 23  Calcium, 24 hour urine     55 - 300 mg/24 h 299   No history of thyrotoxicosis. Reviewed TSH recent levels:  Lab Results  Component Value Date   TSH 1.26 08/13/2016   TSH 0.96 11/17/2015   TSH 1.14 02/09/2014   TSH 0.71 10/06/2013   TSH 1.29 10/15/2012   No history of CKD. Last BUN/Cr: Lab Results  Component Value Date   BUN 14 10/08/2016   CREATININE 0.77 10/08/2016    Multiple myeloma and celiac disease workup was negative: Component     Latest Ref Rng & Units 10/08/2016  Protein Urine Random     Not Estab. mg/dL 19.8  Albumin ELP, Urine     % 40.3  Alpha-1-Globulin, U     % 2.1  ALPHA-2-GLOBULIN, U     % 15.1  Beta Globulin, U     % 26.9  Gamma Globulin, U     % 15.7  M Component, Ur     Not Observed % Not Observed  Please Note:      Comment  Antigliadin Abs, IgA     0 - 19 units 2  Transglutaminase IgA     0 - 3 U/mL <2  IgA/Immunoglobulin A, Serum     90 - 386 mg/dL 140   ROS: Constitutional: no weight gain/no weight loss, no fatigue, no subjective hyperthermia, no subjective hypothermia Eyes: no blurry vision, no xerophthalmia ENT: no sore throat, no nodules palpated in throat, no dysphagia, no odynophagia, no hoarseness Cardiovascular: no CP/no SOB/no palpitations/no leg swelling Respiratory: no cough/no SOB/no wheezing Gastrointestinal: no N/no V/no D/no C/no acid reflux Musculoskeletal: no muscle aches/no joint aches Skin: no rashes, no hair loss Neurological: no tremors/no numbness/no tingling/no dizziness  I reviewed pt's medications, allergies, PMH, social hx, family hx, and changes were documented in the history of present illness. Otherwise, unchanged from my initial visit note.  Past Medical History:  Diagnosis Date  . Abdominal pain 12/01/2015  . Asthma   . Blood in urine   . Diabetes mellitus   . GERD (gastroesophageal reflux disease)   .  Hyperlipidemia    notes only hx of hyperlipidemia  . Loss of weight 12/06/2015  . MR (mental retardation), moderate   . MRSA (methicillin resistant staph aureus) culture positive   . Osteoporosis 08/18/2016  . Routine general medical examination at  a health care facility 10/15/2012   Past Surgical History:  Procedure Laterality Date  . HIP SURGERY     Left  . INTRAMEDULLARY (IM) NAIL INTERTROCHANTERIC Left 04/10/2014   Social History   Social History  . Marital status: Single    Spouse name: N/A  . Number of children: 0   Occupational History  . disabled   Social History Main Topics  . Smoking status: Never Smoker  . Smokeless tobacco: Never Used  . Alcohol use No  . Drug use: No   Current Outpatient Medications on File Prior to Visit  Medication Sig Dispense Refill  . ACCU-CHEK AVIVA PLUS test strip 1 each by Other route 2 (two) times daily. 100 each 12  . ACCU-CHEK FASTCLIX LANCETS MISC Check blood sugars 2 times daily PRN 306 each 12  . acetaminophen (TYLENOL) 500 MG tablet Take 500 mg by mouth every 4 (four) hours as needed (pain; fever >100).     Marland Kitchen albuterol (PROVENTIL HFA;VENTOLIN HFA) 108 (90 BASE) MCG/ACT inhaler Inhale 2 puffs into the lungs 4 (four) times daily as needed for wheezing or shortness of breath. Shortness of breath/wheezing    . alendronate (FOSAMAX) 70 MG tablet Take 1 tablet (70 mg total) by mouth every 7 (seven) days. Take with a full glass of water on an empty stomach. 15 tablet 3  . Calcium Carbonate (CALCIUM 600 PO) Take by mouth 2 (two) times daily.    . Cholecalciferol (VITAMIN D) 2000 units CAPS Take by mouth.    . diphenhydrAMINE (BENADRYL) 25 MG tablet Take 25-50 mg by mouth every 6 (six) hours as needed (allergic reaction). Take 2 tablets (50 mg) for ingestion of shellfish and seek emergency help.    . docusate sodium (COLACE) 100 MG capsule Take 1 capsule (100 mg total) by mouth 2 (two) times daily. Continue this while taking narcotics to help  with bowel movements (Patient taking differently: Take 100 mg by mouth 2 (two) times daily. Continue this while taking narcotics to help with bowel movements  PRN) 30 capsule 1  . fluticasone (FLONASE) 50 MCG/ACT nasal spray Place 1 spray into both nostrils daily as needed for allergies or rhinitis (PRN).     Marland Kitchen glipiZIDE (GLUCOTROL) 5 MG tablet Take 0.5 tablets (2.5 mg total) by mouth daily before supper. 15 tablet 1  . guaiFENesin-dextromethorphan (ROBITUSSIN DM) 100-10 MG/5ML syrup Take 15 mLs by mouth every 4 (four) hours as needed for cough (cold symptoms). Reported on 08/17/2015    . ibuprofen (ADVIL,MOTRIN) 200 MG tablet Take 200 mg by mouth every 4 (four) hours as needed.    . Insulin Glargine (LANTUS SOLOSTAR) 100 UNIT/ML Solostar Pen Inject 8 Units into the skin daily at 10 pm. 5 pen 11  . Insulin Pen Needle (CAREFINE PEN NEEDLES) 32G X 4 MM MISC Use 1x a day 100 each 3  . loperamide (IMODIUM A-D) 2 MG tablet Take 2 mg by mouth as needed for diarrhea or loose stools. Take 2 caplets  or 4 tsp (20 mls) liquid by mouth after first loose stool and 1 caplet or 1 tsp (5 mls) liquid by mouth for each subsequent stool as needed; not to exceed 8 caplets or 50 mls in 24 hours    . loratadine (CLARITIN) 10 MG tablet Take 1 tablet (10 mg total) by mouth daily. 30 tablet 11  . losartan (COZAAR) 25 MG tablet TAKE 1 TABLET BY MOUTH ONCE DAILY. 31 tablet 6  . metFORMIN (GLUCOPHAGE) 1000 MG tablet  TAKE 1 TABLET BY MOUTH TWICE DAILY WITH A MEAL. 60 tablet 6  . montelukast (SINGULAIR) 10 MG tablet TAKE 1 TABLET BY MOUTH ONCE DAILY. PRN 31 tablet 6  . ranitidine (ZANTAC) 300 MG tablet Take 1 tablet (300 mg total) by mouth at bedtime. 30 tablet 5  . saxagliptin HCl (ONGLYZA) 5 MG TABS tablet Take 1 tablet (5 mg total) by mouth daily. 30 tablet 3   No current facility-administered medications on file prior to visit.    Allergies  Allergen Reactions  . Shellfish Allergy Anaphylaxis  . Iohexol     IVP Dye    Family History  Problem Relation Age of Onset  . Alcohol abuse Father   . Cancer Maternal Grandmother   . Hyperlipidemia Maternal Grandmother   . Heart disease Maternal Grandmother   . Hypertension Maternal Grandmother   . Heart disease Maternal Grandfather   . Hypertension Maternal Grandfather   . Hyperlipidemia Maternal Grandfather   . Hyperlipidemia Paternal Grandmother   . Heart disease Paternal Grandmother   . Hypertension Paternal Grandmother   . Kidney disease Paternal Grandmother   . Diabetes Paternal Grandmother   . Heart disease Paternal Grandfather   . Hypertension Paternal Grandfather   . Hyperlipidemia Paternal Grandfather    Pt has FH of DM in father and Paternal GPs.  PE: BP 120/80 (BP Location: Left Arm, Patient Position: Sitting)   Pulse 90   Wt 143 lb (64.9 kg)   SpO2 96%   BMI 22.40 kg/m  Wt Readings from Last 3 Encounters:  04/15/17 143 lb (64.9 kg)  01/28/17 141 lb (64 kg)  10/21/16 140 lb (63.5 kg)   Constitutional: Thin, in NAD, + significant kyphosis Eyes: PERRLA, EOMI, no exophthalmos ENT: moist mucous membranes, no thyromegaly, no cervical lymphadenopathy Cardiovascular: RRR, No MRG Respiratory: CTA B Gastrointestinal: abdomen soft, NT, ND, BS+ Musculoskeletal: no deformities, strength intact in all 4 Skin: moist, warm, no rashes Neurological: no tremor with outstretched hands, DTR normal in all 4  ASSESSMENT: 1. DM1.5, insulin-dependent, uncontrolled, without long term complications  We investigated him for type I DM and GAD Abs were positive >> Type 1.5 DM Component     Latest Ref Rng & Units 10/08/2016  Glucose     65 - 99 mg/dL 73  Pancreatic Islet Cell Antibody     <5 JDF Units <5  Glutamic Acid Decarb Ab     <5 IU/mL 14 (H)  C-Peptide     0.80 - 3.85 ng/mL 1.29   2. Low BMD for age  PLAN:  1. Patient with previously uncontrolled diabetes, then with improvement of control especially after we started long-acting insulin due  to his diagnosis of type 1.5 diabetes (positive GAD antibodies).  However, since last visit, his sugars increase mostly in the last 2 weeks due to his upper respiratory infection. -At the facility, they are now checking his sugars at different times of the day, but he was not given a sugar log and they mentioned that in the morning, his sugars are between 170-200 now during his URI.  Therefore, we will increase his Lantus now and I will ask them to send me the sugar log in 2-3 weeks for further adjustment - I suggested to:  Patient Instructions  Please continue: - Metformin 1000 mg 2x a day, with meals - Onglyza 5 mg daily in am - Glipizide 2.5 mg before a large dinner  Please increase: - Lantus to 10 units at bedtime, and increase to  12 units in 4 days if sugars in am not <140  Please fax me his sugars in 2-3 weeks. Include current med list with the log.  Please continue Fosamax 70 mg weekly.  Please return in 3 months with your sugar log.   - today, HbA1c is 7.5% (stable) - continue checking sugars at different times of the day - check 1x a day, rotating checks - advised for yearly eye exams >> he is UTD - had the flu shot this season - Return to clinic in 3 mo with sugar log    2. Low BMD for age - pt had a hip fx while roller skating in 2015 >> a DEXA scan showed mild low BMD for age. He was started on calcium and vitamin D at that time.  - A repeat DEXA scan this year showed worsening Z scores.  - He had a vitamin D level that was normal recently and he does not have evidence of kidney disease, thyroid disease, hypophosphatasia, anemia, celiac disease, multiple myeloma, hyperparathyroidism, 24 hydroxylase deficiency, calcium malabsorption - Due to the history of fracture and the declining Z scores, we started Fosamax 70 mg weekly, which he continues without any side effects.  He is aware of atypical fractures and osteonecrosis of the jaw as possible side effects.  He does not  complain of any jaw pain or hip pain - + dental work contemplated >> will have a partial plate (choipped tooth) - We'll plan to repeat the DEXA scan in 2 years after starting Fosamax and decide at that time whether we need to continue the medication or not  Philemon Kingdom, MD PhD [facility] Endocrin[facility]

## 2017-04-15 NOTE — Addendum Note (Signed)
Addended by: Caprice Beaver T on: 04/15/2017 04:52 PM   Modules accepted: Orders

## 2017-04-15 NOTE — Patient Instructions (Addendum)
Please continue: - Metformin 1000 mg 2x a day, with meals - Onglyza 5 mg daily in am - Glipizide 2.5 mg before a large dinner  Please increase: - Lantus to 10 units at bedtime, and increase to 12 units in 4 days if sugars in am not <140  Please fax me his sugars in 2-3 weeks. Include current med list with the log.  Please continue Fosamax 70 mg weekly.  Please return in 3 months with your sugar log.

## 2017-06-25 ENCOUNTER — Telehealth: Payer: Self-pay | Admitting: Family Medicine

## 2017-06-25 DIAGNOSIS — E785 Hyperlipidemia, unspecified: Secondary | ICD-10-CM

## 2017-06-25 DIAGNOSIS — I10 Essential (primary) hypertension: Secondary | ICD-10-CM

## 2017-06-25 DIAGNOSIS — E0865 Diabetes mellitus due to underlying condition with hyperglycemia: Secondary | ICD-10-CM

## 2017-06-25 DIAGNOSIS — IMO0001 Reserved for inherently not codable concepts without codable children: Secondary | ICD-10-CM

## 2017-06-25 NOTE — Telephone Encounter (Signed)
Copied from [place]. Topic: Inquiry >> Jun 25, 2017  5:14 PM Corie Chiquito, [place] wrote: Reason for CRM: Patient aunt calling because she would like for him to have his labs  for his physical done the day of his appointment on  07-15-2017 @ 2pm But she would like for them to be done in the morning  before he is to be seen. If someone could give her a call back at [phone]. If no answer please leave msg

## 2017-06-26 NOTE — Telephone Encounter (Signed)
Ordered lab work  Patient notified

## 2017-06-26 NOTE — Telephone Encounter (Signed)
I ordered the basic 4 plus the A1c are there another labs you would like to be done?  Please advise

## 2017-06-26 NOTE — Telephone Encounter (Signed)
No that is good. thanks

## 2017-07-15 ENCOUNTER — Ambulatory Visit (INDEPENDENT_AMBULATORY_CARE_PROVIDER_SITE_OTHER): Payer: Medicare Other | Admitting: Family Medicine

## 2017-07-15 ENCOUNTER — Encounter: Payer: Self-pay | Admitting: Family Medicine

## 2017-07-15 ENCOUNTER — Other Ambulatory Visit (INDEPENDENT_AMBULATORY_CARE_PROVIDER_SITE_OTHER): Payer: Medicare Other

## 2017-07-15 VITALS — BP 116/75 | HR 111 | Temp 98.1°F | Resp 16 | Ht 67.0 in | Wt 144.6 lb

## 2017-07-15 DIAGNOSIS — E0865 Diabetes mellitus due to underlying condition with hyperglycemia: Secondary | ICD-10-CM | POA: Diagnosis not present

## 2017-07-15 DIAGNOSIS — E785 Hyperlipidemia, unspecified: Secondary | ICD-10-CM

## 2017-07-15 DIAGNOSIS — Z0000 Encounter for general adult medical examination without abnormal findings: Secondary | ICD-10-CM

## 2017-07-15 DIAGNOSIS — M810 Age-related osteoporosis without current pathological fracture: Secondary | ICD-10-CM | POA: Diagnosis not present

## 2017-07-15 DIAGNOSIS — IMO0001 Reserved for inherently not codable concepts without codable children: Secondary | ICD-10-CM

## 2017-07-15 DIAGNOSIS — F79 Unspecified intellectual disabilities: Secondary | ICD-10-CM

## 2017-07-15 DIAGNOSIS — I10 Essential (primary) hypertension: Secondary | ICD-10-CM

## 2017-07-15 DIAGNOSIS — R252 Cramp and spasm: Secondary | ICD-10-CM

## 2017-07-15 LAB — CBC
HEMATOCRIT: 46.3 % (ref 39.0–52.0)
HEMOGLOBIN: 15.7 g/dL (ref 13.0–17.0)
MCHC: 33.8 g/dL (ref 30.0–36.0)
MCV: 88.6 fl (ref 78.0–100.0)
PLATELETS: 305 10*3/uL (ref 150.0–400.0)
RBC: 5.23 Mil/uL (ref 4.22–5.81)
RDW: 13.9 % (ref 11.5–15.5)
WBC: 6 10*3/uL (ref 4.0–10.5)

## 2017-07-15 LAB — LIPID PANEL
Cholesterol: 161 mg/dL (ref 0–200)
HDL: 53.2 mg/dL (ref >39.00)
LDL CALC: 88 mg/dL (ref 0–99)
NONHDL: 107.57
Total CHOL/HDL Ratio: 3
Triglycerides: 98 mg/dL (ref 0.0–149.0)
VLDL: 19.6 mg/dL (ref 0.0–40.0)

## 2017-07-15 LAB — COMPREHENSIVE METABOLIC PANEL
ALT: 9 U/L (ref 0–53)
AST: 11 U/L (ref 0–37)
Albumin: 4.8 g/dL (ref 3.5–5.2)
Alkaline Phosphatase: 42 U/L (ref 39–117)
BUN: 9 mg/dL (ref 6–23)
CHLORIDE: 101 meq/L (ref 96–112)
CO2: 31 mEq/L (ref 19–32)
Calcium: 10.5 mg/dL (ref 8.4–10.5)
Creatinine, Ser: 0.75 mg/dL (ref 0.40–1.50)
GFR: 125.53 mL/min (ref >60.00)
Glucose, Bld: 82 mg/dL (ref 70–99)
POTASSIUM: 4.8 meq/L (ref 3.5–5.1)
SODIUM: 140 meq/L (ref 135–145)
Total Bilirubin: 1.5 mg/dL — ABNORMAL HIGH (ref 0.2–1.2)
Total Protein: 7.6 g/dL (ref 6.0–8.3)

## 2017-07-15 LAB — TSH: TSH: 1.35 u[IU]/mL (ref 0.35–4.50)

## 2017-07-15 LAB — HEMOGLOBIN A1C: Hgb A1c MFr Bld: 6.8 % — ABNORMAL HIGH (ref 4.6–6.5)

## 2017-07-15 MED ORDER — ACCU-CHEK AVIVA PLUS VI STRP: 1.0000 | ORAL_STRIP | Freq: Two times a day (BID) | 12 refills | Status: DC

## 2017-07-15 MED ORDER — ACCU-CHEK FASTCLIX LANCETS MISC: 12 refills | Status: DC

## 2017-07-15 NOTE — Assessment & Plan Note (Signed)
Well controlled, no changes to meds. Encouraged heart healthy diet such as the DASH diet and exercise as tolerated.  °

## 2017-07-15 NOTE — Patient Instructions (Addendum)
Hyland's leg cramp med as needed 64 oz of clear fluids daily gatorade daily  Please provide with proof of current flu shot if possible Preventive Care 18-39 Years, Male Preventive care refers to lifestyle choices and visits with your health care provider that can promote health and wellness. What does preventive care include?  A yearly physical exam. This is also called an annual well check.  Dental exams once or twice a year.  Routine eye exams. Ask your health care provider how often you should have your eyes checked.  Personal lifestyle choices, including: ? Daily care of your teeth and gums. ? Regular physical activity. ? Eating a healthy diet. ? Avoiding tobacco and drug use. ? Limiting alcohol use. ? Practicing safe sex. ? Taking vitamin and mineral supplements as recommended by your health care provider. What happens during an annual well check? The services and screenings done by your health care provider during your annual well check will depend on your age, overall health, lifestyle risk factors, and family history of disease. Counseling Your health care provider may ask you questions about your:  Alcohol use.  Tobacco use.  Drug use.  Emotional well-being.  Home and relationship well-being.  Sexual activity.  Eating habits.  Work and work Statistician.  Method of birth control.  Menstrual cycle.  Pregnancy history.  Screening You may have the following tests or measurements:  Height, weight, and BMI.  Diabetes screening. This is done by checking your blood sugar (glucose) after you have not eaten for a while (fasting).  Blood pressure.  Lipid and cholesterol levels. These may be checked every 5 years starting at age 28.  Skin check.  Hepatitis C blood test.  Hepatitis B blood test.  Sexually transmitted disease (STD) testing.  BRCA-related cancer screening. This may be done if you have a family history of breast, ovarian, tubal, or  peritoneal cancers.  Pelvic exam and Pap test. This may be done every 3 years starting at age 4. Starting at age 67, this may be done every 5 years if you have a Pap test in combination with an HPV test.  Discuss your test results, treatment options, and if necessary, the need for more tests with your health care provider. Vaccines Your health care provider may recommend certain vaccines, such as:  Influenza vaccine. This is recommended every year.  Tetanus, diphtheria, and acellular pertussis (Tdap, Td) vaccine. You may need a Td booster every 10 years.  Varicella vaccine. You may need this if you have not been vaccinated.  HPV vaccine. If you are 91 or younger, you may need three doses over 6 months.  Measles, mumps, and rubella (MMR) vaccine. You may need at least one dose of MMR. You may also need a second dose.  Pneumococcal 13-valent conjugate (PCV13) vaccine. You may need this if you have certain conditions and were not previously vaccinated.  Pneumococcal polysaccharide (PPSV23) vaccine. You may need one or two doses if you smoke cigarettes or if you have certain conditions.  Meningococcal vaccine. One dose is recommended if you are age 35-21 years and a first-year college student living in a residence hall, or if you have one of several medical conditions. You may also need additional booster doses.  Hepatitis A vaccine. You may need this if you have certain conditions or if you travel or work in places where you may be exposed to hepatitis A.  Hepatitis B vaccine. You may need this if you have certain conditions or if you  travel or work in places where you may be exposed to hepatitis B.  Haemophilus influenzae type b (Hib) vaccine. You may need this if you have certain risk factors.  Talk to your health care provider about which screenings and vaccines you need and how often you need them. This information is not intended to replace advice given to you by your health care  provider. Make sure you discuss any questions you have with your health care provider. Document Released: 07/16/2001 Document Revised: 02/07/2016 Document Reviewed: 03/21/2015 Elsevier Interactive Patient Education  2018 [place] 18-39 Years, Male Preventive care refers to lifestyle choices and visits with your health care provider that can promote health and wellness. What does preventive care include?  A yearly physical exam. This is also called an annual well check.  Dental exams once or twice a year.  Routine eye exams. Ask your health care provider how often you should have your eyes checked.  Personal lifestyle choices, including: ? Daily care of your teeth and gums. ? Regular physical activity. ? Eating a healthy diet. ? Avoiding tobacco and drug use. ? Limiting alcohol use. ? Practicing safe sex. ? Taking vitamin and mineral supplements as recommended by your health care provider. What happens during an annual well check? The services and screenings done by your health care provider during your annual well check will depend on your age, overall health, lifestyle risk factors, and family history of disease. Counseling Your health care provider may ask you questions about your:  Alcohol use.  Tobacco use.  Drug use.  Emotional well-being.  Home and relationship well-being.  Sexual activity.  Eating habits.  Work and work Statistician.  Method of birth control.  Menstrual cycle.  Pregnancy history.  Screening You may have the following tests or measurements:  Height, weight, and BMI.  Diabetes screening. This is done by checking your blood sugar (glucose) after you have not eaten for a while (fasting).  Blood pressure.  Lipid and cholesterol levels. These may be checked every 5 years starting at age 33.  Skin check.  Hepatitis C blood test.  Hepatitis B blood test.  Sexually transmitted disease (STD)  testing.  BRCA-related cancer screening. This may be done if you have a family history of breast, ovarian, tubal, or peritoneal cancers.  Pelvic exam and Pap test. This may be done every 3 years starting at age 38. Starting at age 17, this may be done every 5 years if you have a Pap test in combination with an HPV test.  Discuss your test results, treatment options, and if necessary, the need for more tests with your health care provider. Vaccines Your health care provider may recommend certain vaccines, such as:  Influenza vaccine. This is recommended every year.  Tetanus, diphtheria, and acellular pertussis (Tdap, Td) vaccine. You may need a Td booster every 10 years.  Varicella vaccine. You may need this if you have not been vaccinated.  HPV vaccine. If you are 13 or younger, you may need three doses over 6 months.  Measles, mumps, and rubella (MMR) vaccine. You may need at least one dose of MMR. You may also need a second dose.  Pneumococcal 13-valent conjugate (PCV13) vaccine. You may need this if you have certain conditions and were not previously vaccinated.  Pneumococcal polysaccharide (PPSV23) vaccine. You may need one or two doses if you smoke cigarettes or if you have certain conditions.  Meningococcal vaccine. One dose is recommended if you are  age 64-21 years and a first-year college student living in a residence hall, or if you have one of several medical conditions. You may also need additional booster doses.  Hepatitis A vaccine. You may need this if you have certain conditions or if you travel or work in places where you may be exposed to hepatitis A.  Hepatitis B vaccine. You may need this if you have certain conditions or if you travel or work in places where you may be exposed to hepatitis B.  Haemophilus influenzae type b (Hib) vaccine. You may need this if you have certain risk factors.  Talk to your health care provider about which screenings and vaccines you  need and how often you need them. This information is not intended to replace advice given to you by your health care provider. Make sure you discuss any questions you have with your health care provider. Document Released: 07/16/2001 Document Revised: 02/07/2016 Document Reviewed: 03/21/2015 Elsevier Interactive Patient Education  [employer].

## 2017-07-15 NOTE — Progress Notes (Signed)
Subjective:  I acted as a Education administrator for Dr. Charlett Blake. Adam Fitzpatrick, [place]  Patient ID: Adam Fitzpatrick, adult    DOB: 08-08-1980, 37 y.o.   MRN: [id number]  Chief Complaint  Patient presents with  . Annual Exam    HPI  Patient is in today for an annual exam and follow on on chronic medical concerns including hypertension, hyperlipidemia and more. He is accompanied by his Aunt who brings him in for his appointment. He feels well. No recent febrile illness or hospitalizations. He is eating well. His only complaint is some muscle cramps at night in his legs. Happens a couple times a week. He is tolerating his Fosamax. Denies CP/palp/SOB/HA/congestion/fevers/GI or GU c/o. Taking meds as prescribed  Patient Care Team: Mosie Lukes, MD as PCP - General (Family Medicine)   Past Medical History:  Diagnosis Date  . Abdominal pain 12/01/2015  . Asthma   . Blood in urine   . Diabetes mellitus   . GERD (gastroesophageal reflux disease)   . Hyperlipidemia    notes only hx of hyperlipidemia  . Loss of weight 12/06/2015  . MR (mental retardation), moderate   . MRSA (methicillin resistant staph aureus) culture positive   . Osteoporosis 08/18/2016  . Routine general medical examination at a health care facility 10/15/2012    Past Surgical History:  Procedure Laterality Date  . HIP SURGERY     Left  . INTRAMEDULLARY (IM) NAIL INTERTROCHANTERIC Left 04/10/2014  . INTRAMEDULLARY (IM) NAIL INTERTROCHANTERIC Left 04/10/2014   Procedure: INTRAMEDULLARY (IM) NAIL INTERTROCHANTRIC LEFT HIP;  Surgeon: Renette Butters, MD;  Location: [place];  Service: Orthopedics;  Laterality: Left;    Family History  Problem Relation Age of Onset  . Alcohol abuse Father   . Cancer Maternal Grandmother   . Hyperlipidemia Maternal Grandmother   . Heart disease Maternal Grandmother   . Hypertension Maternal Grandmother   . Heart disease Maternal Grandfather   . Hypertension Maternal Grandfather   . Hyperlipidemia Maternal  Grandfather   . Hyperlipidemia Paternal Grandmother   . Heart disease Paternal Grandmother   . Hypertension Paternal Grandmother   . Kidney disease Paternal Grandmother   . Diabetes Paternal Grandmother   . Heart disease Paternal Grandfather   . Hypertension Paternal Grandfather   . Hyperlipidemia Paternal Grandfather   . Alcohol abuse Mother     Social History   Socioeconomic History  . Marital status: Single    Spouse name: Not on file  . Number of children: Not on file  . Years of education: Not on file  . Highest education level: Not on file  Social Needs  . Financial resource strain: Not on file  . Food insecurity - worry: Not on file  . Food insecurity - inability: Not on file  . Transportation needs - medical: Not on file  . Transportation needs - non-medical: Not on file  Occupational History  . Not on file  Tobacco Use  . Smoking status: Never Smoker  . Smokeless tobacco: Never Used  Substance and Sexual Activity  . Alcohol use: No  . Drug use: No  . Sexual activity: No  Other Topics Concern  . Not on file  Social History Narrative  . Not on file    Outpatient Medications Prior to Visit  Medication Sig Dispense Refill  . acetaminophen (TYLENOL) 500 MG tablet Take 500 mg by mouth every 4 (four) hours as needed (pain; fever >100).     Marland Kitchen albuterol (PROVENTIL HFA;VENTOLIN HFA)  108 (90 BASE) MCG/ACT inhaler Inhale 2 puffs into the lungs 4 (four) times daily as needed for wheezing or shortness of breath. Shortness of breath/wheezing    . Calcium Carbonate (CALCIUM 600 PO) Take by mouth 2 (two) times daily.    . Cholecalciferol (VITAMIN D) 2000 units CAPS Take by mouth.    . diphenhydrAMINE (BENADRYL) 25 MG tablet Take 25-50 mg by mouth every 6 (six) hours as needed (allergic reaction). Take 2 tablets (50 mg) for ingestion of shellfish and seek emergency help.    . docusate sodium (COLACE) 100 MG capsule Take 1 capsule (100 mg total) by mouth 2 (two) times daily.  Continue this while taking narcotics to help with bowel movements (Patient taking differently: Take 100 mg by mouth 2 (two) times daily. Continue this while taking narcotics to help with bowel movements  PRN) 30 capsule 1  . fluticasone (FLONASE) 50 MCG/ACT nasal spray Place 1 spray into both nostrils daily as needed for allergies or rhinitis (PRN).     Marland Kitchen guaiFENesin-dextromethorphan (ROBITUSSIN DM) 100-10 MG/5ML syrup Take 15 mLs by mouth every 4 (four) hours as needed for cough (cold symptoms). Reported on 08/17/2015    . ibuprofen (ADVIL,MOTRIN) 200 MG tablet Take 200 mg by mouth every 4 (four) hours as needed.    . Insulin Glargine (LANTUS SOLOSTAR) 100 UNIT/ML Solostar Pen Inject 10 Units daily at 10 pm into the skin. 5 pen 11  . loperamide (IMODIUM A-D) 2 MG tablet Take 2 mg by mouth as needed for diarrhea or loose stools. Take 2 caplets  or 4 tsp (20 mls) liquid by mouth after first loose stool and 1 caplet or 1 tsp (5 mls) liquid by mouth for each subsequent stool as needed; not to exceed 8 caplets or 50 mls in 24 hours    . loratadine (CLARITIN) 10 MG tablet Take 1 tablet (10 mg total) by mouth daily. 30 tablet 11  . losartan (COZAAR) 25 MG tablet TAKE 1 TABLET BY MOUTH ONCE DAILY. 31 tablet 6  . montelukast (SINGULAIR) 10 MG tablet TAKE 1 TABLET BY MOUTH ONCE DAILY. PRN 31 tablet 6  . ranitidine (ZANTAC) 300 MG tablet Take 1 tablet (300 mg total) by mouth at bedtime. 30 tablet 5  . ACCU-CHEK AVIVA PLUS test strip 1 each by Other route 2 (two) times daily. 100 each 12  . ACCU-CHEK FASTCLIX LANCETS MISC Check blood sugars 2 times daily PRN 306 each 12  . alendronate (FOSAMAX) 70 MG tablet Take 1 tablet (70 mg total) by mouth every 7 (seven) days. Take with a full glass of water on an empty stomach. 15 tablet 3  . glipiZIDE (GLUCOTROL) 5 MG tablet Take 0.5 tablets (2.5 mg total) by mouth daily before supper. (Patient taking differently: Take 2.5 mg daily before supper by mouth. ) 15 tablet 1  .  Insulin Pen Needle (CAREFINE PEN NEEDLES) 32G X 4 MM MISC Use 1x a day 100 each 3  . metFORMIN (GLUCOPHAGE) 1000 MG tablet TAKE 1 TABLET BY MOUTH TWICE DAILY WITH A MEAL. 60 tablet 6  . saxagliptin HCl (ONGLYZA) 5 MG TABS tablet Take 1 tablet (5 mg total) by mouth daily. 30 tablet 3   No facility-administered medications prior to visit.     Allergies  Allergen Reactions  . Shellfish Allergy Anaphylaxis  . Iohexol     IVP Dye    Review of Systems  Constitutional: Negative for chills, fever and malaise/fatigue.  HENT: Negative for congestion and hearing  loss.   Eyes: Negative for discharge.  Respiratory: Negative for cough, sputum production and shortness of breath.   Cardiovascular: Negative for chest pain, palpitations and leg swelling.  Gastrointestinal: Negative for abdominal pain, blood in stool, constipation, diarrhea, heartburn, nausea and vomiting.  Genitourinary: Negative for dysuria, frequency, hematuria and urgency.  Musculoskeletal: Positive for myalgias. Negative for back pain and falls.  Skin: Negative for rash.  Neurological: Negative for dizziness, sensory change, loss of consciousness, weakness and headaches.  Endo/Heme/Allergies: Negative for environmental allergies. Does not bruise/bleed easily.  Psychiatric/Behavioral: Negative for depression and suicidal ideas. The patient is not nervous/anxious and does not have insomnia.        Objective:    Physical Exam  Constitutional: He is oriented to person, place, and time. He appears well-developed and well-nourished. No distress.  HENT:  Head: Normocephalic and atraumatic.  Eyes: Conjunctivae are normal.  Neck: Neck supple. No thyromegaly present.  Cardiovascular: Normal rate, regular rhythm and normal heart sounds.  No murmur heard. Pulmonary/Chest: Effort normal and breath sounds normal. No respiratory distress. He has no wheezes.  Abdominal: Soft. Bowel sounds are normal. He exhibits no mass. There is no  tenderness.  Musculoskeletal: He exhibits no edema.  Lymphadenopathy:    He has no cervical adenopathy.  Neurological: He is alert and oriented to person, place, and time.  Skin: Skin is warm and dry.  Psychiatric: He has a normal mood and affect. His behavior is normal.    BP 116/75 (BP Location: Right Arm, Patient Position: Sitting, Cuff Size: Normal)   Pulse (!) 111   Temp 98.1 F (36.7 C) (Oral)   Resp 16   Ht 5\' 7"  (1.702 m)   Wt 144 lb 9.6 oz (65.6 kg)   SpO2 100%   BMI 22.65 kg/m  Wt Readings from Last 3 Encounters:  07/16/17 142 lb 3.2 oz (64.5 kg)  07/15/17 144 lb 9.6 oz (65.6 kg)  04/15/17 143 lb (64.9 kg)   BP Readings from Last 3 Encounters:  07/16/17 108/71  07/15/17 116/75  04/15/17 120/80     Immunization History  Administered Date(s) Administered  . Influenza, Seasonal, Injecte, Preservative Fre 03/24/2015  . Pneumococcal Conjugate-13 08/17/2015  . Tdap 08/17/2015    Health Maintenance  Topic Date Due  . PNEUMOCOCCAL POLYSACCHARIDE VACCINE (1) 12/07/1983  . OPHTHALMOLOGY EXAM  12/07/1991  . HIV Screening  12/06/1996  . PAP SMEAR  12/07/2002  . FOOT EXAM  08/16/2016  . HEMOGLOBIN A1C  01/12/2018  . TETANUS/TDAP  08/16/2025  . INFLUENZA VACCINE  Completed    Lab Results  Component Value Date   WBC 6.0 07/15/2017   HGB 15.7 07/15/2017   HCT 46.3 07/15/2017   PLT 305.0 07/15/2017   GLUCOSE 82 07/15/2017   CHOL 161 07/15/2017   TRIG 98.0 07/15/2017   HDL 53.20 07/15/2017   LDLCALC 88 07/15/2017   ALT 9 07/15/2017   AST 11 07/15/2017   NA 140 07/15/2017   K 4.8 07/15/2017   CL 101 07/15/2017   CREATININE 0.75 07/15/2017   BUN 9 07/15/2017   CO2 31 07/15/2017   TSH 1.35 07/15/2017   HGBA1C 6.6 07/16/2017   MICROALBUR 2.3 (H) 10/23/2011    Lab Results  Component Value Date   TSH 1.35 07/15/2017   Lab Results  Component Value Date   WBC 6.0 07/15/2017   HGB 15.7 07/15/2017   HCT 46.3 07/15/2017   MCV 88.6 07/15/2017   PLT 305.0  07/15/2017   Lab Results  Component Value Date   NA 140 07/15/2017   K 4.8 07/15/2017   CO2 31 07/15/2017   GLUCOSE 82 07/15/2017   BUN 9 07/15/2017   CREATININE 0.75 07/15/2017   BILITOT 1.5 (H) 07/15/2017   ALKPHOS 42 07/15/2017   AST 11 07/15/2017   ALT 9 07/15/2017   PROT 7.6 07/15/2017   ALBUMIN 4.8 07/15/2017   CALCIUM 10.5 07/15/2017   ANIONGAP 13 04/11/2014   GFR 125.53 07/15/2017   Lab Results  Component Value Date   CHOL 161 07/15/2017   Lab Results  Component Value Date   HDL 53.20 07/15/2017   Lab Results  Component Value Date   LDLCALC 88 07/15/2017   Lab Results  Component Value Date   TRIG 98.0 07/15/2017   Lab Results  Component Value Date   CHOLHDL 3 07/15/2017   Lab Results  Component Value Date   HGBA1C 6.6 07/16/2017         Assessment & Plan:   Problem List Items Addressed This Visit    Mental retardation    Lives in a group home and is  Accompanied by his aunt.      Routine general medical examination at a health care facility    Patient encouraged to maintain heart healthy diet, regular exercise, adequate sleep. Consider daily probiotics. Take medications as prescribed      Hypertension    Well controlled, no changes to meds. Encouraged heart healthy diet such as the DASH diet and exercise as tolerated.       Osteoporosis    Tolerating Fosamax       Diabetes mellitus due to underlying condition, uncontrolled, without complication, without long-term current use of insulin (HCC)    hgba1c acceptable, minimize simple carbs. Increase exercise as tolerated. Continue current meds, following with endocrinology      Muscle cramp - Primary    Happens a couple times a week at night. Encouraged increased hydration, try Hyland's leg cramp med prn.      Relevant Orders   Magnesium      I have discontinued Marchia Meiers. Rosell's ACCU-CHEK AVIVA PLUS and ACCU-CHEK FASTCLIX LANCETS. I am also having him maintain his albuterol,  guaiFENesin-dextromethorphan, acetaminophen, loperamide, fluticasone, docusate sodium, diphenhydrAMINE, losartan, ibuprofen, Calcium Carbonate (CALCIUM 600 PO), Vitamin D, loratadine, ranitidine, montelukast, and Insulin Glargine.  Meds ordered this encounter  Medications  . DISCONTD: ACCU-CHEK AVIVA PLUS test strip    Sig: 1 each by Other route 2 (two) times daily.    Dispense:  100 each    Refill:  12  . DISCONTD: ACCU-CHEK FASTCLIX LANCETS MISC    Sig: Check blood sugars 2 times daily PRN    Dispense:  306 each    Refill:  12    CMA served as scribe during this visit. History, Physical and Plan performed by medical provider. Documentation and orders reviewed and attested to.  Penni Homans, MD

## 2017-07-15 NOTE — Assessment & Plan Note (Addendum)
hgba1c acceptable, minimize simple carbs. Increase exercise as tolerated. Continue current meds, following with endocrinology 

## 2017-07-16 ENCOUNTER — Encounter: Payer: Self-pay | Admitting: Internal Medicine

## 2017-07-16 ENCOUNTER — Ambulatory Visit (INDEPENDENT_AMBULATORY_CARE_PROVIDER_SITE_OTHER): Payer: Medicare Other | Admitting: Internal Medicine

## 2017-07-16 VITALS — BP 108/71 | HR 71 | Ht 67.0 in | Wt 142.2 lb

## 2017-07-16 DIAGNOSIS — R252 Cramp and spasm: Secondary | ICD-10-CM | POA: Insufficient documentation

## 2017-07-16 DIAGNOSIS — E119 Type 2 diabetes mellitus without complications: Secondary | ICD-10-CM | POA: Diagnosis not present

## 2017-07-16 DIAGNOSIS — E0865 Diabetes mellitus due to underlying condition with hyperglycemia: Secondary | ICD-10-CM

## 2017-07-16 DIAGNOSIS — M8580 Other specified disorders of bone density and structure, unspecified site: Secondary | ICD-10-CM | POA: Diagnosis not present

## 2017-07-16 DIAGNOSIS — M859 Disorder of bone density and structure, unspecified: Secondary | ICD-10-CM

## 2017-07-16 DIAGNOSIS — IMO0001 Reserved for inherently not codable concepts without codable children: Secondary | ICD-10-CM

## 2017-07-16 LAB — POCT GLYCOSYLATED HEMOGLOBIN (HGB A1C): Hemoglobin A1C: 6.6

## 2017-07-16 MED ORDER — INSULIN PEN NEEDLE 32G X 4 MM MISC: 3 refills | Status: DC

## 2017-07-16 MED ORDER — SAXAGLIPTIN HCL 5 MG PO TABS: 5.0000 mg | ORAL_TABLET | Freq: Every day | ORAL | 3 refills | Status: DC

## 2017-07-16 MED ORDER — ACCU-CHEK AVIVA PLUS VI STRP: 1.0000 | ORAL_STRIP | Freq: Two times a day (BID) | 12 refills | Status: DC

## 2017-07-16 MED ORDER — METFORMIN HCL 1000 MG PO TABS: ORAL_TABLET | ORAL | 3 refills | Status: DC

## 2017-07-16 MED ORDER — ACCU-CHEK FASTCLIX LANCETS MISC: 12 refills | Status: DC

## 2017-07-16 MED ORDER — GLIPIZIDE 5 MG PO TABS: 2.5000 mg | ORAL_TABLET | Freq: Every day | ORAL | 5 refills | Status: DC

## 2017-07-16 MED ORDER — ALENDRONATE SODIUM 70 MG PO TABS: 70.0000 mg | ORAL_TABLET | ORAL | 3 refills | Status: DC

## 2017-07-16 NOTE — Assessment & Plan Note (Signed)
Tolerating Fosamax  

## 2017-07-16 NOTE — Assessment & Plan Note (Signed)
Happens a couple times a week at night. Encouraged increased hydration, try Hyland's leg cramp med prn.

## 2017-07-16 NOTE — Assessment & Plan Note (Signed)
Lives in a group home and is  Accompanied by his aunt.

## 2017-07-16 NOTE — Patient Instructions (Addendum)
Please continue: - Metformin 1000 mg 2x a day, with meals - Onglyza 5 mg daily in am - Glipizide 2.5 mg before a large dinner  Please decrease: - Lantus to 9 units at bedtime  Please continue Fosamax 70 mg weekly.  Please check sugars: - in am in even days - at bedtime in odd days  Please fax me the sugars in 2 weeks.  Please return in 4 months with your sugar log.

## 2017-07-16 NOTE — Assessment & Plan Note (Signed)
Patient encouraged to maintain heart healthy diet, regular exercise, adequate sleep. Consider daily probiotics. Take medications as prescribed 

## 2017-07-16 NOTE — Progress Notes (Signed)
Patient ID: Adam Fitzpatrick, adult   DOB: 08/15/1980, 37 y.o.   MRN: [id number]   HPI: Adam Fitzpatrick [facility]) is a 37 y.o.-year-old adult, returning for f/u for DM1.5, dx as DM2 in 2011, insulin-dependent, uncontrolled, without complications. He also has low BMD for age. He is here with his aunt who offers ost of the hx as pt is mostly nonverbal. Last visit 3 months ago.  DM1.5: Last hemoglobin A1c was better: Lab Results  Component Value Date   HGBA1C 6.8 (H) 07/15/2017   HGBA1C 7.5 04/15/2017   HGBA1C 7.5 01/28/2017   HGBA1C 8.8 (H) 08/13/2016   HGBA1C 7.4 (H) 11/17/2015   HGBA1C 9.0 (H) 06/09/2015   HGBA1C 6.5 02/09/2014   HGBA1C 8.2 (H) 10/06/2013   HGBA1C 6.1 10/15/2012   HGBA1C 6.2 10/23/2011   He is on: - Lantus 6 >> 8  >> 10 units at bedtime - Metformin 1000 mg 2x a day, with meals - Onglyza 5 mg daily in am - Glipizide 2.5 mg before large dinner -takes this when he eats out: on Saturdays  Pt checks his sugars once a day at the group home: - am: 108-152,200s after night when he eats out >> 170-200 >> 61-94 in last month - 2h after b'fast: n/c >> 138, 145 >> n/c - before lunch: n/c >> 145 >> n/c - 2h after lunch: n/c >> 128 >> n/c - before dinner: n/c >> 138 >> n/c - 2h after dinner: n/c >> 150, 181 >> n/c - bedtime: n/c - nighttime: n/c Lowest sugar was 170 >> 61;   Unclear if he has hypoglycemia unawareness Highest sugar was 230 >> 145.  Glucometer: AccuChek Aviva  Pt's meals are: - Breakfast: oatmeal, eggs, bacon, sausage, pancakes (sugarfree syrup) - Lunch: 2 PB sandwiches on wheat - Dinner: low carb - Snacks: before bedtime He lost a significant amount of weight in last few years: 25-30 lbs-  now his weight stabilized after we started insulin  -No CKD, last BUN/creatinine:  Lab Results  Component Value Date   BUN 9 07/15/2017   BUN 14 10/08/2016   CREATININE 0.75 07/15/2017   CREATININE 0.77 10/08/2016  On losartan. -+ HL; last set of lipids: Lab  Results  Component Value Date   CHOL 161 07/15/2017   HDL 53.20 07/15/2017   LDLCALC 88 07/15/2017   TRIG 98.0 07/15/2017   CHOLHDL 3 07/15/2017  He is not on a statin. - last eye exam was In 10/2016: No DR.  Dr. Nancee Liter in [place]. - No numbness and tingling in his feet.  He also has low BMD for age:  Reviewed and addended history: Reviewed available DXA scans: Z-score Lumbar spine (L1-L4) Femoral neck (FN) 33% distal radius UD radius  08/13/2016 -2.6 RFN: -2.4 LFN: n/a -0.1 n/a  05/04/2014 -2.1 RFN: -2.2 +0.6 +0.1  No dizziness/vertigo/orthostasis.  He had a Left hip fracture in 2015 [place] skating ([employer]).  No history of vitamin D deficiency. Reviewed available vit D levels: Lab Results  Component Value Date   VD25OH 72.84 11/17/2015   1, 25 dihydroxy vitamin D level normal: Component     Latest Ref Rng & Units 10/08/2016  Vitamin D 1, 25 (OH) Total     18 - 72 pg/mL 31  Vitamin D3 1, 25 (OH)     pg/mL reviewed and addended history: 31  Vitamin D2 1, 25 (OH)     pg/mL <8   He continues on calcium 600 mg twice a day  and vitamin D 2000 units daily  No known h/o celiac disease, multiple myeloma.  No weightbearing exercises.  He is not taking high vitamin A doses.  Pt does have a FH of osteoporosis: MGM.  No hypo-or hypercalcemia, or hyperparathyroidism; no history of kidney stones. Lab Results  Component Value Date   PTH 21 10/08/2016   CALCIUM 10.5 07/15/2017   CALCIUM 9.8 10/08/2016   CALCIUM 10.3 12/01/2015   CALCIUM 9.8 11/17/2015   CALCIUM 10.2 06/09/2015   CALCIUM 8.8 04/11/2014   CALCIUM 9.8 04/09/2014   CALCIUM 9.4 02/09/2014   CALCIUM 9.5 10/06/2013   CALCIUM 9.4 10/15/2012   24-hour urine calcium was normal: Component     Latest Ref Rng & Units 10/14/2016  Creatinine, Urine     20 - 370 mg/dL 121  Creatinine, 24H Ur     0.63 - 2.50 g/24 h 1.57  Calcium, Ur     Not estab mg/dL 23  Calcium, 24 hour urine     55 - 300 mg/24 h  299   No thyrotoxicosis. Reviewed TSH recent levels:  Lab Results  Component Value Date   TSH 1.35 07/15/2017   TSH 1.26 08/13/2016   TSH 0.96 11/17/2015   TSH 1.14 02/09/2014   TSH 0.71 10/06/2013   No CKD. Last BUN/Cr: Lab Results  Component Value Date   BUN 9 07/15/2017   CREATININE 0.75 07/15/2017    Multiple myeloma and celiac disease workup wa snegative: Component     Latest Ref Rng & Units 10/08/2016  Protein Urine Random     Not Estab. mg/dL 19.8  Albumin ELP, Urine     % 40.3  Alpha-1-Globulin, U     % 2.1  ALPHA-2-GLOBULIN, U     % 15.1  Beta Globulin, U     % 26.9  Gamma Globulin, U     % 15.7  M Component, Ur     Not Observed % Not Observed  Please Note:      Comment  Antigliadin Abs, IgA     0 - 19 units 2  Transglutaminase IgA     0 - 3 U/mL <2  IgA/Immunoglobulin A, Serum     90 - 386 mg/dL 140   ROS: Constitutional: no weight gain/no weight loss, no fatigue, no subjective hyperthermia, no subjective hypothermia Eyes: no blurry vision, no xerophthalmia ENT: no sore throat, no nodules palpated in throat, no dysphagia, no odynophagia, no hoarseness Cardiovascular: no CP/no SOB/no palpitations/no leg swelling Respiratory: no cough/no SOB/no wheezing Gastrointestinal: no N/no V/no D/no C/no acid reflux Musculoskeletal: no muscle aches/no joint aches Skin: no rashes, no hair loss Neurological: no tremors/no numbness/no tingling/no dizziness  I reviewed pt's medications, allergies, PMH, social hx, family hx, and changes were documented in the history of present illness. Otherwise, unchanged from my initial visit note.  Past Medical History:  Diagnosis Date  . Abdominal pain 12/01/2015  . Asthma   . Blood in urine   . Diabetes mellitus   . GERD (gastroesophageal reflux disease)   . Hyperlipidemia    notes only hx of hyperlipidemia  . Loss of weight 12/06/2015  . MR (mental retardation), moderate   . MRSA (methicillin resistant staph aureus)  culture positive   . Osteoporosis 08/18/2016  . Routine general medical examination at a health care facility 10/15/2012   Past Surgical History:  Procedure Laterality Date  . HIP SURGERY     Left  . INTRAMEDULLARY (IM) NAIL INTERTROCHANTERIC Left 04/10/2014  .  INTRAMEDULLARY (IM) NAIL INTERTROCHANTERIC Left 04/10/2014   Procedure: INTRAMEDULLARY (IM) NAIL INTERTROCHANTRIC LEFT HIP;  Surgeon: Renette Butters, MD;  Location: [facility];  Service: Orthopedics;  Laterality: Left;   Social History   Social History  . Marital status: Single    Spouse name: N/A  . Number of children: 0   Occupational History  . disabled   Social History Main Topics  . Smoking status: Never Smoker  . Smokeless tobacco: Never Used  . Alcohol use No  . Drug use: No   Current Outpatient Medications on File Prior to Visit  Medication Sig Dispense Refill  . ACCU-CHEK AVIVA PLUS test strip 1 each by Other route 2 (two) times daily. 100 each 12  . ACCU-CHEK FASTCLIX LANCETS MISC Check blood sugars 2 times daily PRN 306 each 12  . acetaminophen (TYLENOL) 500 MG tablet Take 500 mg by mouth every 4 (four) hours as needed (pain; fever >100).     Marland Kitchen albuterol (PROVENTIL HFA;VENTOLIN HFA) 108 (90 BASE) MCG/ACT inhaler Inhale 2 puffs into the lungs 4 (four) times daily as needed for wheezing or shortness of breath. Shortness of breath/wheezing    . alendronate (FOSAMAX) 70 MG tablet Take 1 tablet (70 mg total) by mouth every 7 (seven) days. Take with a full glass of water on an empty stomach. 15 tablet 3  . Calcium Carbonate (CALCIUM 600 PO) Take by mouth 2 (two) times daily.    . Cholecalciferol (VITAMIN D) 2000 units CAPS Take by mouth.    . diphenhydrAMINE (BENADRYL) 25 MG tablet Take 25-50 mg by mouth every 6 (six) hours as needed (allergic reaction). Take 2 tablets (50 mg) for ingestion of shellfish and seek emergency help.    . docusate sodium (COLACE) 100 MG capsule Take 1 capsule (100 mg total) by mouth 2 (two)  times daily. Continue this while taking narcotics to help with bowel movements (Patient taking differently: Take 100 mg by mouth 2 (two) times daily. Continue this while taking narcotics to help with bowel movements  PRN) 30 capsule 1  . fluticasone (FLONASE) 50 MCG/ACT nasal spray Place 1 spray into both nostrils daily as needed for allergies or rhinitis (PRN).     Marland Kitchen glipiZIDE (GLUCOTROL) 5 MG tablet Take 0.5 tablets (2.5 mg total) by mouth daily before supper. (Patient taking differently: Take 2.5 mg daily before supper by mouth. ) 15 tablet 1  . guaiFENesin-dextromethorphan (ROBITUSSIN DM) 100-10 MG/5ML syrup Take 15 mLs by mouth every 4 (four) hours as needed for cough (cold symptoms). Reported on 08/17/2015    . ibuprofen (ADVIL,MOTRIN) 200 MG tablet Take 200 mg by mouth every 4 (four) hours as needed.    . Insulin Glargine (LANTUS SOLOSTAR) 100 UNIT/ML Solostar Pen Inject 10 Units daily at 10 pm into the skin. 5 pen 11  . Insulin Pen Needle (CAREFINE PEN NEEDLES) 32G X 4 MM MISC Use 1x a day 100 each 3  . loperamide (IMODIUM A-D) 2 MG tablet Take 2 mg by mouth as needed for diarrhea or loose stools. Take 2 caplets  or 4 tsp (20 mls) liquid by mouth after first loose stool and 1 caplet or 1 tsp (5 mls) liquid by mouth for each subsequent stool as needed; not to exceed 8 caplets or 50 mls in 24 hours    . loratadine (CLARITIN) 10 MG tablet Take 1 tablet (10 mg total) by mouth daily. 30 tablet 11  . losartan (COZAAR) 25 MG tablet TAKE 1 TABLET BY MOUTH  ONCE DAILY. 31 tablet 6  . metFORMIN (GLUCOPHAGE) 1000 MG tablet TAKE 1 TABLET BY MOUTH TWICE DAILY WITH A MEAL. 60 tablet 6  . montelukast (SINGULAIR) 10 MG tablet TAKE 1 TABLET BY MOUTH ONCE DAILY. PRN 31 tablet 6  . ranitidine (ZANTAC) 300 MG tablet Take 1 tablet (300 mg total) by mouth at bedtime. 30 tablet 5  . saxagliptin HCl (ONGLYZA) 5 MG TABS tablet Take 1 tablet (5 mg total) by mouth daily. 30 tablet 3   No current facility-administered  medications on file prior to visit.    Allergies  Allergen Reactions  . Shellfish Allergy Anaphylaxis  . Iohexol     IVP Dye   Family History  Problem Relation Age of Onset  . Alcohol abuse Father   . Cancer Maternal Grandmother   . Hyperlipidemia Maternal Grandmother   . Heart disease Maternal Grandmother   . Hypertension Maternal Grandmother   . Heart disease Maternal Grandfather   . Hypertension Maternal Grandfather   . Hyperlipidemia Maternal Grandfather   . Hyperlipidemia Paternal Grandmother   . Heart disease Paternal Grandmother   . Hypertension Paternal Grandmother   . Kidney disease Paternal Grandmother   . Diabetes Paternal Grandmother   . Heart disease Paternal Grandfather   . Hypertension Paternal Grandfather   . Hyperlipidemia Paternal Grandfather   . Alcohol abuse Mother    Pt has FH of DM in father and Paternal GPs.  PE: BP 108/71 (BP Location: Left Arm, Patient Position: Sitting, Cuff Size: Normal)   Pulse 71   Ht 5' 7"  (1.702 m)   Wt 142 lb 3.2 oz (64.5 kg)   BMI 22.27 kg/m  Wt Readings from Last 3 Encounters:  07/16/17 142 lb 3.2 oz (64.5 kg)  07/15/17 144 lb 9.6 oz (65.6 kg)  04/15/17 143 lb (64.9 kg)   Constitutional: + thin, + significant kyphosis, in NAD Eyes: PERRLA, EOMI, no exophthalmos ENT: moist mucous membranes, no thyromegaly, no cervical lymphadenopathy Cardiovascular: RRR, No MRG Respiratory: CTA B Gastrointestinal: abdomen soft, NT, ND, BS+ Musculoskeletal: no deformities, strength intact in all 4 Skin: moist, warm, no rashes Neurological: no tremor with outstretched hands, DTR normal in all 4  ASSESSMENT: 1. DM1.5, insulin-dependent, uncontrolled, without long term complications  We investigated him for type I DM and GAD Abs were positive >> Type 1.5 DM Component     Latest Ref Rng & Units 10/08/2016  Glucose     65 - 99 mg/dL 73  Pancreatic Islet Cell Antibody     <5 JDF Units <5  Glutamic Acid Decarb Ab     <5 IU/mL 14  (H)  C-Peptide     0.80 - 3.85 ng/mL 1.29   2. Low BMD for age  PLAN:  1. Patient with previously uncontrolled diabetes, with improvement of control after we started long-acting insulin.  He has type 1.5 diabetes (with normal insulin production, but positive GAD antibodies).  At last visit, his sugars were higher so we increased Lantus by 2 units.   - At this visit, his sugars are quite low in the morning, between 60s and 90s in January.  The facility did not send sugars for February.  Unfortunately, they are only checking his sugars in the morning and I suspect that they are increasing during the day, but I do not have data to support this.  His HbA1c from earlier this month, which was better, at 6.8%. - to avoid low blood sugars, I advised him to slightly reduce the  dose of Lantus, to 9 units daily, but I do not feel that other adjustments are necessary - We will have the facility try to check his sugars in the morning alternating with bedtime - I also will advised him to send me the sugar log in 2 weeks - I suggested to:  Patient Instructions  Please continue: - Metformin 1000 mg 2x a day, with meals - Onglyza 5 mg daily in am - Glipizide 2.5 mg before a large dinner  Please decrease: - Lantus to 9 units at bedtime  Please continue Fosamax 70 mg weekly.  Please check sugars: - in am in even days - at bedtime in odd days  Please fax me the sugars in 2 weeks.  Please return in 4 months with your sugar log.   - continue checking sugars at different times of the day - check 1x a day, rotating checks - advised for yearly eye exams >> he is UTD - UTD with flu shot - Return to clinic in 4 mo with sugar log    2. Low BMD for age - pt had a hip fx while roller skating in 2015 >> a DXA scan showed mild low PMD for age.  He was started on calcium and vitamin D at that time. - We repeated a DXA scan in 2018 and it showed worsening Z scores. - Investigation for secondary causes for  osteoporosis was negative: No vitamin D deficiency, no evidence of kidney disease, thyroid disease, hypophosphatasia, anemia, celiac disease, multiple myeloma, hyperparathyroidism 24 hydroxylase deficiency, calcium malabsorption. - Due to the history of fracture and the declining Z scores, we started Fosamax 70 mg weekly - He continues Fosamax without side effects.  He and his aunt are aware of the risk of atypical fractures and osteonecrosis of the jaw with this medication.  No jaw pain or hip pain.   - We plan to repeat a new DXA scan 2 years after starting Fosamax and decide at that time whether we need to continue the medication or not  Adam Kingdom, MD PhD [facility] Endocrinology

## 2017-07-22 ENCOUNTER — Telehealth: Payer: Self-pay

## 2017-07-22 NOTE — Telephone Encounter (Signed)
Called group home at [phone] left detailed message for Adam Fitzpatrick or Adam Fitzpatrick informing them his paper is completed.   Need to know what to do with the forms, either I can mail them or they can pick them up.

## 2017-07-24 ENCOUNTER — Telehealth: Payer: Self-pay | Admitting: Family Medicine

## 2017-07-24 NOTE — Telephone Encounter (Signed)
Pam (checked DR LIC Randell Loop- medical assistant from facility)-ok per Princess came to pick up documents.

## 2017-08-05 ENCOUNTER — Telehealth: Payer: Self-pay

## 2017-08-05 NOTE — Telephone Encounter (Signed)
Received copy of blood sugar logs, Dr Orpah Greek reviewed and stated to keep on current regiman. LM for a call back

## 2017-08-06 NOTE — Telephone Encounter (Signed)
Sister Jeannette Corpus returned call to confirm message received and verbalized understanding.

## 2017-08-11 ENCOUNTER — Other Ambulatory Visit: Payer: Self-pay | Admitting: Internal Medicine

## 2017-11-18 ENCOUNTER — Encounter: Payer: Self-pay | Admitting: Internal Medicine

## 2017-11-18 ENCOUNTER — Ambulatory Visit (INDEPENDENT_AMBULATORY_CARE_PROVIDER_SITE_OTHER): Payer: Medicare Other | Admitting: Internal Medicine

## 2017-11-18 VITALS — BP 98/70 | HR 89 | Ht 67.0 in | Wt 145.4 lb

## 2017-11-18 DIAGNOSIS — E139 Other specified diabetes mellitus without complications: Secondary | ICD-10-CM

## 2017-11-18 DIAGNOSIS — M8580 Other specified disorders of bone density and structure, unspecified site: Secondary | ICD-10-CM

## 2017-11-18 DIAGNOSIS — M859 Disorder of bone density and structure, unspecified: Secondary | ICD-10-CM

## 2017-11-18 LAB — POCT GLYCOSYLATED HEMOGLOBIN (HGB A1C): HEMOGLOBIN A1C: 6.7 % — AB (ref 4.0–5.6)

## 2017-11-18 MED ORDER — INSULIN GLARGINE 100 UNIT/ML SOLOSTAR PEN: 9.0000 [IU] | PEN_INJECTOR | Freq: Every day | SUBCUTANEOUS | 11 refills | Status: DC

## 2017-11-18 NOTE — Addendum Note (Signed)
Addended by: Drucilla Schmidt on: 11/18/2017 01:42 PM   Modules accepted: Orders

## 2017-11-18 NOTE — Progress Notes (Signed)
Patient ID: SANG BLOUNT, adult   DOB: [birth year], 37 y.o.   MRN: [id number]   HPI: JHOVANNY GUINTA [facility]) is a 37 y.o.-year-old adult, returning for f/u for DM1.5, dx as DM2 in 2011, insulin-dependent, uncontrolled, without long-term complications and low BMD for age.  He is here with his aunt who offers almost all of the history as patient is mostly nonverbal.   Last visit 4 months ago.  He will have his teeth extracted at the end of June >> they need instruction about DM meds for that period.   DM1.5:  Reviewed HbA1c levels: Lab Results  Component Value Date   HGBA1C 6.6 07/16/2017   HGBA1C 6.8 (H) 07/15/2017   HGBA1C 7.5 04/15/2017   HGBA1C 7.5 01/28/2017   HGBA1C 8.8 (H) 08/13/2016   HGBA1C 7.4 (H) 11/17/2015   HGBA1C 9.0 (H) 06/09/2015   HGBA1C 6.5 02/09/2014   HGBA1C 8.2 (H) 10/06/2013   HGBA1C 6.1 10/15/2012   He is on: - Metformin 1000 mg 2x a day, with meals - Onglyza 5 mg daily in am - Lantus 9 units at bedtime  - Glipizide 2.5 mg before a large dinner - takes this when he eats out on Saturdays  Pt checks his sugars once a day in the group home: - am: 170-200 >> 61-94 in last month >> 70-159, 210, 329 - 2h after b'fast: n/c >> 138, 145 >> n/c - before lunch: n/c >> 145 >> n/c - 2h after lunch: n/c >> 128 >> n/c - before dinner: n/c >> 138 >> n/c - 2h after dinner: n/c >> 150, 181 >> n/c >> 80-158, 218 - bedtime: n/c - nighttime: n/c Lowest sugar was 170 >> 61 >> 70; it is unclear at which level he has hypoglycemia awareness. Highest sugar was 230 >> 145 >> 329 (???).  Glucometer: AccuChek [employer]iva  Pt's meals are: - Breakfast: oatmeal, eggs, bacon, sausage, pancakes (sugarfree syrup) - Lunch: 2 PB sandwiches on wheat - Dinner: low carb - Snacks: before bedtime He lost a significant amount of weight in last few years: 25-30 lbs-his weight stabilized after starting insulin.+3 lbs since last visit.  -No CKD, last BUN/creatinine:  Lab Results  Component  Value Date   BUN 9 07/15/2017   BUN 14 10/08/2016   CREATININE 0.75 07/15/2017   CREATININE 0.77 10/08/2016  On losartan. -+ HL; last set of lipids was normal: Lab Results  Component Value Date   CHOL 161 07/15/2017   HDL 53.20 07/15/2017   LDLCALC 88 07/15/2017   TRIG 98.0 07/15/2017   CHOLHDL 3 07/15/2017  He is not on a statin. - last eye exam was in 10/2016: no DR.  Dr. Nancee [place] in [place]. - Denies numbness and tingling in his feet.  Low BMD for age:  Reviewed and addended history: Reviewed available DXA scans: Z-score Lumbar spine (L1-L4) Femoral neck (FN) 33% distal radius UD radius  08/13/2016 -2.6 RFN: -2.4 LFN: n/a -0.1 n/a  05/04/2014 -2.1 RFN: -2.2 +0.6 +0.1  No dizziness/vertigo/orthostasis.  He had a Left hip fracture in 2015 while rollerskating ([employer]).  No vitamin D deficiency.  Reviewed available vit D levels: Lab Results  Component Value Date   VD25OH 72.84 11/17/2015   Calcitriol level was normal: Component     Latest Ref Rng & Units 10/08/2016  Vitamin D 1, 25 (OH) Total     18 - 72 pg/mL 31  Vitamin D3 1, 25 (OH)     pg/mL reviewed and addended history:  31  Vitamin D2 1, 25 (OH)     pg/mL <8   He continues on calcium 600 mg 2x a day and vitamin D 2000 units daily.    We started Fosamax in 11/2016.  No side effects.  No weightbearing exercises.  He is not taking high vitamin A doses.  Pt does have a FH of osteoporosis: MGM.  No hypo-or hypercalcemia or hyperparathyroidism; no history of kidney stones. Lab Results  Component Value Date   PTH 21 10/08/2016   CALCIUM 10.5 07/15/2017   CALCIUM 9.8 10/08/2016   CALCIUM 10.3 12/01/2015   CALCIUM 9.8 11/17/2015   CALCIUM 10.2 06/09/2015   CALCIUM 8.8 04/11/2014   CALCIUM 9.8 04/09/2014   CALCIUM 9.4 02/09/2014   CALCIUM 9.5 10/06/2013   CALCIUM 9.4 10/15/2012   24-hour urine calcium was normal: Component     Latest Ref Rng & Units 10/14/2016  Creatinine, Urine     20 -  370 mg/dL 121  Creatinine, 24H Ur     0.63 - 2.50 g/24 h 1.57  Calcium, Ur     Not estab mg/dL 23  Calcium, 24 hour urine     55 - 300 mg/24 h 299   N no thyrotoxicosis.  Reviewed TSH levels: Lab Results  Component Value Date   TSH 1.35 07/15/2017   TSH 1.26 08/13/2016   TSH 0.96 11/17/2015   TSH 1.14 02/09/2014   TSH 0.71 10/06/2013   No CKD.  Last BUN/Cr: Lab Results  Component Value Date   BUN 9 07/15/2017   CREATININE 0.75 07/15/2017    Multiple myeloma and celiac disease work-up negative: Component     Latest Ref Rng & Units 10/08/2016  Protein Urine Random     Not Estab. mg/dL 19.8  Albumin ELP, Urine     % 40.3  Alpha-1-Globulin, U     % 2.1  ALPHA-2-GLOBULIN, U     % 15.1  Beta Globulin, U     % 26.9  Gamma Globulin, U     % 15.7  M Component, Ur     Not Observed % Not Observed  Please Note:      Comment  Antigliadin Abs, IgA     0 - 19 units 2  Transglutaminase IgA     0 - 3 U/mL <2  IgA/Immunoglobulin A, Serum     90 - 386 mg/dL 140   ROS: Constitutional: no weight gain/no weight loss, no fatigue, no subjective hyperthermia, no subjective hypothermia Eyes: no blurry vision, no xerophthalmia ENT: no sore throat, no nodules palpated in throat, no dysphagia, no odynophagia, no hoarseness Cardiovascular: no CP/no SOB/no palpitations/no leg swelling Respiratory: no cough/no SOB/no wheezing Gastrointestinal: no N/no V/no D/no C/no acid reflux Musculoskeletal: no muscle aches/no joint aches Skin: no rashes, no hair loss Neurological: no tremors/no numbness/no tingling/no dizziness  I reviewed pt's medications, allergies, PMH, social hx, family hx, and changes were documented in the history of present illness. Otherwise, unchanged from my initial visit note. Past Medical History:  Diagnosis Date  . Abdominal pain 12/01/2015  . Asthma   . Blood in urine   . Diabetes mellitus   . GERD (gastroesophageal reflux disease)   . Hyperlipidemia    notes only  hx of hyperlipidemia  . Loss of weight 12/06/2015  . MR (mental retardation), moderate   . MRSA (methicillin resistant staph aureus) culture positive   . Osteoporosis 08/18/2016  . Routine general medical examination at a health care facility 10/15/2012  Past Surgical History:  Procedure Laterality Date  . HIP SURGERY     Left  . INTRAMEDULLARY (IM) NAIL INTERTROCHANTERIC Left 04/10/2014  . INTRAMEDULLARY (IM) NAIL INTERTROCHANTERIC Left 04/10/2014   Procedure: INTRAMEDULLARY (IM) NAIL INTERTROCHANTRIC LEFT HIP;  Surgeon: Renette Butters, MD;  Location: [facility];  Service: Orthopedics;  Laterality: Left;    Social History   Socioeconomic History  . Marital status: Single    Spouse name: Not on file  . Number of children: Not on file  . Years of education: Not on file  . Highest education level: Not on file  Occupational History  . Not on file  Social Needs  . Financial resource strain: Not on file  . Food insecurity:    Worry: Not on file    Inability: Not on file  . Transportation needs:    Medical: Not on file    Non-medical: Not on file  Tobacco Use  . Smoking status: Never Smoker  . Smokeless tobacco: Never Used  Substance and Sexual Activity  . Alcohol use: No  . Drug use: No  . Sexual activity: Never  Lifestyle  . Physical activity:    Days per week: Not on file    Minutes per session: Not on file  . Stress: Not on file  Relationships  . Social connections:    Talks on phone: Not on file    Gets together: Not on file    Attends religious service: Not on file    Active member of club or organization: Not on file    Attends meetings of clubs or organizations: Not on file    Relationship status: Not on file  . Intimate partner violence:    Fear of current or ex partner: Not on file    Emotionally abused: Not on file    Physically abused: Not on file    Forced sexual activity: Not on file  Other Topics Concern  . Not on file  Social History Narrative  . Not  on file    Current Outpatient Medications on File Prior to Visit  Medication Sig Dispense Refill  . ACCU-CHEK AVIVA PLUS test strip 1 each by Other route 2 (two) times daily. 100 each 12  . ACCU-CHEK FASTCLIX LANCETS MISC Check blood sugars 2 times daily PRN 306 each 12  . acetaminophen (TYLENOL) 500 MG tablet Take 500 mg by mouth every 4 (four) hours as needed (pain; fever >100).     Marland Kitchen albuterol (PROVENTIL HFA;VENTOLIN HFA) 108 (90 BASE) MCG/ACT inhaler Inhale 2 puffs into the lungs 4 (four) times daily as needed for wheezing or shortness of breath. Shortness of breath/wheezing    . alendronate (FOSAMAX) 70 MG tablet Take 1 tablet (70 mg total) by mouth every 7 (seven) days. Take with a full glass of water on an empty stomach. 15 tablet 3  . B-D UF III MINI PEN NEEDLES 31G X 5 MM MISC USE AS DIRECTED 100 each 11  . Calcium Carbonate (CALCIUM 600 PO) Take by mouth 2 (two) times daily.    . Cholecalciferol (VITAMIN D) 2000 units CAPS Take by mouth.    . diphenhydrAMINE (BENADRYL) 25 MG tablet Take 25-50 mg by mouth every 6 (six) hours as needed (allergic reaction). Take 2 tablets (50 mg) for ingestion of shellfish and seek emergency help.    . docusate sodium (COLACE) 100 MG capsule Take 1 capsule (100 mg total) by mouth 2 (two) times daily. Continue this while taking narcotics to help  with bowel movements (Patient taking differently: Take 100 mg by mouth 2 (two) times daily. Continue this while taking narcotics to help with bowel movements  PRN) 30 capsule 1  . fluticasone (FLONASE) 50 MCG/ACT nasal spray Place 1 spray into both nostrils daily as needed for allergies or rhinitis (PRN).     Marland Kitchen guaiFENesin-dextromethorphan (ROBITUSSIN DM) 100-10 MG/5ML syrup Take 15 mLs by mouth every 4 (four) hours as needed for cough (cold symptoms). Reported on 08/17/2015    . ibuprofen (ADVIL,MOTRIN) 200 MG tablet Take 200 mg by mouth every 4 (four) hours as needed.    . Insulin Glargine (LANTUS SOLOSTAR) 100  UNIT/ML Solostar Pen Inject 10 Units daily at 10 pm into the skin. 5 pen 11  . Insulin Pen Needle (CAREFINE PEN NEEDLES) 32G X 4 MM MISC Use 1x a day 100 each 3  . loperamide (IMODIUM A-D) 2 MG tablet Take 2 mg by mouth as needed for diarrhea or loose stools. Take 2 caplets  or 4 tsp (20 mls) liquid by mouth after first loose stool and 1 caplet or 1 tsp (5 mls) liquid by mouth for each subsequent stool as needed; not to exceed 8 caplets or 50 mls in 24 hours    . loratadine (CLARITIN) 10 MG tablet Take 1 tablet (10 mg total) by mouth daily. 30 tablet 11  . losartan (COZAAR) 25 MG tablet TAKE 1 TABLET BY MOUTH ONCE DAILY. 31 tablet 6  . metFORMIN (GLUCOPHAGE) 1000 MG tablet TAKE 1 TABLET BY MOUTH TWICE DAILY WITH A MEAL. 180 tablet 3  . montelukast (SINGULAIR) 10 MG tablet TAKE 1 TABLET BY MOUTH ONCE DAILY. PRN 31 tablet 6  . ranitidine (ZANTAC) 300 MG tablet Take 1 tablet (300 mg total) by mouth at bedtime. 30 tablet 5  . saxagliptin HCl (ONGLYZA) 5 MG TABS tablet Take 1 tablet (5 mg total) by mouth daily. 90 tablet 3  . glipiZIDE (GLUCOTROL) 5 MG tablet Take 0.5 tablets (2.5 mg total) by mouth daily before supper. (Patient not taking: Reported on 11/18/2017) 10 tablet 5   No current facility-administered medications on file prior to visit.     Allergies  Allergen Reactions  . Shellfish Allergy Anaphylaxis  . Iohexol     IVP Dye    Family History  Problem Relation Age of Onset  . Alcohol abuse Father   . Cancer Maternal Grandmother   . Hyperlipidemia Maternal Grandmother   . Heart disease Maternal Grandmother   . Hypertension Maternal Grandmother   . Heart disease Maternal Grandfather   . Hypertension Maternal Grandfather   . Hyperlipidemia Maternal Grandfather   . Hyperlipidemia Paternal Grandmother   . Heart disease Paternal Grandmother   . Hypertension Paternal Grandmother   . Kidney disease Paternal Grandmother   . Diabetes Paternal Grandmother   . Heart disease Paternal  Grandfather   . Hypertension Paternal Grandfather   . Hyperlipidemia Paternal Grandfather   . Alcohol abuse Mother    Pt has FH of DM in father and Paternal GPs.  PE: BP 98/70   Pulse 89   Ht 5' 7"  (1.702 m)   Wt 145 lb 6.4 oz (66 kg)   SpO2 98%   BMI 22.77 kg/m  Wt Readings from Last 3 Encounters:  11/18/17 145 lb 6.4 oz (66 kg)  07/16/17 142 lb 3.2 oz (64.5 kg)  07/15/17 144 lb 9.6 oz (65.6 kg)   Constitutional: Thin, in NAD, significant kyphosis Eyes: PERRLA, EOMI, no exophthalmos ENT: moist mucous membranes,  no thyromegaly, no cervical lymphadenopathy Cardiovascular: RRR, No MRG Respiratory: CTA B Gastrointestinal: abdomen soft, NT, ND, BS+ Musculoskeletal: no deformities, strength intact in all 4 Skin: moist, warm, no rashes Neurological: no tremor with outstretched hands, DTR normal in all 4  ASSESSMENT: 1. DM1.5, insulin-dependent, uncontrolled, without long term complications  We investigated him for type I DM and GAD Abs were positive >> Type 1.5 DM Component     Latest Ref Rng & Units 10/08/2016  Glucose     65 - 99 mg/dL 73  Pancreatic Islet Cell Antibody     <5 JDF Units <5  Glutamic Acid Decarb Ab     <5 IU/mL 14 (H)  C-Peptide     0.80 - 3.85 ng/mL 1.29   2. Low BMD for age  PLAN:  1. Patient with previously uncontrolled diabetes, with improvement of control after we started long-acting insulin.  He has type 1.5 diabetes (positive GAD antibodies, with normal insulin production).  At last visit, we were able to decrease the dose of Lantus as he was having lower blood sugars in the morning, between 60s and 90s.  At that time, an HbA1c was excellent, at 6.6%. - At this visit, per review of the records  brought by his arms, sugars are mostly at goal both in the morning and at bedtime, with very few hyperglycemic spikes.  They cannot explain the spikes.  He had one very high blood sugar in the 300s, however, they do not have an explanation for this and I doubt  that this was a real hyperglycemic spike - He is tolerating well his medications, but will need instructions about how to use them around the time of his tooth extraction.  Please see below. - I suggested to:  Patient Instructions  Please continue: - Metformin 1000 mg 2x a day, with meals - Onglyza 5 mg daily in am - Glipizide 2.5 mg before a large dinner  - Lantus 9 units at bedtime  The day before the surgery, skip Lantus. The day of the surgery: skip am Metformin and Lantus but continue Onglyza. Resume regular medication regimen the day after the surgery.  Please check sugars: - in am in even days - at bedtime in odd days  Continue Fosamax 70 mg weekly.  Please return in 4 months with your sugar log.   - today, HbA1c is 6.7% (slightly higher) - continue checking sugars at different times of the day - check 1-2x a day, rotating checks - advised for yearly eye exams >> he is UTD - Return to clinic in 4 mo with sugar log     2. Low BMD for age - He had a hip fracture while rollerskating in 2015.  At that time, DXA scan showed mild low bone mineral density for age.  He was started on calcium and vitamin D at that time.  We repeated a DXA scan in 2018 and it showed worsening Z scores. - Investigations for secondary causes for osteoporosis were negative: No vitamin D deficiency, no kidney disease, thyroid disease, hypophosphatasia, anemia, celiac disease, multiple myeloma, hyperparathyroidism, 24 hydroxylase deficiency, calcium malabsorption - Due to the history of fracture and declining Z scores, we started Fosamax 70 mg weekly.  He continues this without side effects.  He and his aunt who is very involved in his care are aware of the risk of atypical fracture and osteonecrosis of the jaw with this medication.  No jaw pain or hip pain.  Discussed about the fact  that he will need to continue Fosamax around the time of his surgery.  I explained that stopping the Fosamax for a few days  around that time is not indicated, especially since bisphosphonates have a long half-life in the bone. - We plan to repeat a DEXA scan 2 years after starting Fosamax and decide at that time whether we need to continue the medication or not -I would have liked to check a vitamin D level at this visit, however, his aunts tells me that he was not given the vitamin D supplement consistently at the facility.  They will start doing that and I will check this at next visit.  Philemon Kingdom, MD PhD [facility] End[facility]

## 2017-11-18 NOTE — Patient Instructions (Addendum)
Please continue: - Metformin 1000 mg 2x a day, with meals - Onglyza 5 mg daily in am - Glipizide 2.5 mg before a large dinner  - Lantus 9 units at bedtime  The day before the surgery, skip Lantus. The day of the surgery: skip am Metformin and Lantus but continue Onglyza. Resume regular medication regimen the day after the surgery.  Please check sugars: - in am in even days - at bedtime in odd days  Continue Fosamax 70 mg weekly.  Please return in 4 months with your sugar log.

## 2018-03-17 DIAGNOSIS — M2041 Other hammer toe(s) (acquired), right foot: Secondary | ICD-10-CM | POA: Diagnosis not present

## 2018-03-17 DIAGNOSIS — M79675 Pain in left toe(s): Secondary | ICD-10-CM | POA: Diagnosis not present

## 2018-03-17 DIAGNOSIS — E0840 Diabetes mellitus due to underlying condition with diabetic neuropathy, unspecified: Secondary | ICD-10-CM | POA: Diagnosis not present

## 2018-03-17 DIAGNOSIS — B351 Tinea unguium: Secondary | ICD-10-CM | POA: Diagnosis not present

## 2018-03-26 ENCOUNTER — Encounter: Payer: Self-pay | Admitting: Internal Medicine

## 2018-03-26 ENCOUNTER — Ambulatory Visit (INDEPENDENT_AMBULATORY_CARE_PROVIDER_SITE_OTHER): Payer: Medicare Other | Admitting: Internal Medicine

## 2018-03-26 VITALS — BP 112/80 | HR 64 | Ht 67.0 in | Wt 138.0 lb

## 2018-03-26 DIAGNOSIS — Z794 Long term (current) use of insulin: Secondary | ICD-10-CM

## 2018-03-26 DIAGNOSIS — R200 Anesthesia of skin: Secondary | ICD-10-CM

## 2018-03-26 DIAGNOSIS — M859 Disorder of bone density and structure, unspecified: Secondary | ICD-10-CM

## 2018-03-26 DIAGNOSIS — M8580 Other specified disorders of bone density and structure, unspecified site: Secondary | ICD-10-CM

## 2018-03-26 DIAGNOSIS — E1165 Type 2 diabetes mellitus with hyperglycemia: Secondary | ICD-10-CM | POA: Diagnosis not present

## 2018-03-26 DIAGNOSIS — R202 Paresthesia of skin: Secondary | ICD-10-CM | POA: Diagnosis not present

## 2018-03-26 LAB — POCT GLYCOSYLATED HEMOGLOBIN (HGB A1C): Hemoglobin A1C: 6 % — AB (ref 4.0–5.6)

## 2018-03-26 LAB — VITAMIN B12: VITAMIN B 12: 458 pg/mL (ref 211–911)

## 2018-03-26 LAB — VITAMIN D 25 HYDROXY (VIT D DEFICIENCY, FRACTURES): VITD: 82.12 ng/mL (ref 30.00–100.00)

## 2018-03-26 MED ORDER — GLIPIZIDE 5 MG PO TABS: 5.0000 mg | ORAL_TABLET | Freq: Every day | ORAL | 5 refills | Status: DC

## 2018-03-26 NOTE — Patient Instructions (Signed)
Please continue: - Metformin 1000 mg 2x a day, with meals - Onglyza 5 mg daily in am - Lantus 9 units at bedtime  Please increase: - Glipizide to 5 mg before a large dinner   Please stop at the lab.  Please return in 4 months with your sugar log.

## 2018-03-26 NOTE — Progress Notes (Signed)
Patient ID: Adam Fitzpatrick, adult   DOB: 1981-02-11, 37 y.o.   MRN: [id number]   HPI: Adam MAA[facility] Shanon Fitzpatrick) is a 37 y.o. man, returning for f/u for DM1.5, dx as DM2 in 2011, insulin-dependent, uncontrolled, without long-term complications and low BMD for age.  He is here with his aunt who offers almost all of the history as patient is mostly nonverbal.  Last visit 4 months ago.  He has more numbness and tingling in feet.  No sores or cuts.  DM1.5:  Reviewed HbA1c levels: Lab Results  Component Value Date   HGBA1C 6.7 (A) 11/18/2017   HGBA1C 6.6 07/16/2017   HGBA1C 6.8 (H) 07/15/2017   HGBA1C 7.5 04/15/2017   HGBA1C 7.5 01/28/2017   HGBA1C 8.8 (H) 08/13/2016   HGBA1C 7.4 (H) 11/17/2015   HGBA1C 9.0 (H) 06/09/2015   HGBA1C 6.5 02/09/2014   HGBA1C 8.2 (H) 10/06/2013   He is on: - Metformin 1000 mg 2x a day, with meals - Onglyza 5 mg daily in am - Glipizide 2.5 mg before a large dinner (every 2 weeks, when he is taken out to eat dinner-at [employer]) - Lantus 9 units at bedtime  Pt checks his sugars once a day and the group home -reviewed records brought by aunt: - am: Is a good idea 61-94 >> 70-159, 210, 329 >> 67, 73-122, 148 - 2h after b'fast: n/c >> 138, 145 >> n/c - before lunch: n/c >> 145 >> n/c - 2h after lunch: n/c >> 128 >> n/c - before dinner: n/c >> 138 >> n/c - 2h after dinner:  80-158, 218 >> 87-178, when going out to eat; 220-285 - bedtime: n/c - nighttime: n/c Lowest sugar was 170 >> 61 >> 70 >> 67; it is unclear at which level he has hypoglycemia awareness Highest sugar was 230 >> 145 >> 329 (???) >> 285  Glucometer: AccuChek Aviva  Pt's meals are: - Breakfast: oatmeal, eggs, bacon, sausage, pancakes (sugarfree syrup) - Lunch: 2 PB sandwiches on wheat - Dinner: low carb - Snacks: before bedtime He lost a significant amount of weight in last few years: 25-30 lbs-but his weight stabilized after starting insulin.  Unfortunately, since last visit, he lost 7  pounds.  -No CKD, last BUN/creatinine:  Lab Results  Component Value Date   BUN 9 07/15/2017   BUN 14 10/08/2016   CREATININE 0.75 07/15/2017   CREATININE 0.77 10/08/2016  On losartan. -+ HL; last set of lipids normal: Lab Results  Component Value Date   CHOL 161 07/15/2017   HDL 53.20 07/15/2017   LDLCALC 88 07/15/2017   TRIG 98.0 07/15/2017   CHOLHDL 3 07/15/2017  Not on a statin.. - last eye exam was in 10/2016: No DR.  Dr. Nancee Liter in [place]. -No numbness and tingling in his feet.  Low BMD for age:  Reviewed and addended history: Reviewed available DXA scans: Z-score Lumbar spine (L1-L4) Femoral neck (FN) 33% distal radius UD radius  08/13/2016 -2.6 RFN: -2.4 LFN: n/a -0.1 n/a  05/04/2014 -2.1 RFN: -2.2 +0.6 +0.1  No dizziness/vertigo/orthostasis.  He had a left hip fracture in 2015 while [place] [employer].  No history of vitamin D deficiency, however, latest levels reviewed was from 2017. Lab Results  Component Value Date   VD25OH 72.84 11/17/2015   His calcitriol level was normal: Component     Latest Ref Rng & Units 10/08/2016  Vitamin D 1, 25 (OH) Total     18 - 72 pg/mL 31  Vitamin D3 1, 25 (OH)     pg/mL reviewed and addended history: 31  Vitamin D2 1, 25 (OH)     pg/mL <8   He continues on calcium 600 mg 2x a day and vitamin D 2000 units daily.  We started Fosamax in 11/2016.  No side effects.  He had recent dental extraction which healed well, without any problems.  No weightbearing exercises.  He is not taking high vitamin A doses.  Pt does have a FH of osteoporosis: MGM.  No hypo-or hypercalcemia or hyperparathyroidism.  No history of kidney stones. Lab Results  Component Value Date   PTH 21 10/08/2016   CALCIUM 10.5 07/15/2017   CALCIUM 9.8 10/08/2016   CALCIUM 10.3 12/01/2015   CALCIUM 9.8 11/17/2015   CALCIUM 10.2 06/09/2015   His 24-hour urine calcium was normal: Component     Latest Ref Rng & Units  10/14/2016  Creatinine, Urine     20 - 370 mg/dL 121  Creatinine, 24H Ur     0.63 - 2.50 g/24 h 1.57  Calcium, Ur     Not estab mg/dL 23  Calcium, 24 hour urine     55 - 300 mg/24 h 299   No thyrotoxicosis.  Reviewed TSH levels: Lab Results  Component Value Date   TSH 1.35 07/15/2017   TSH 1.26 08/13/2016   TSH 0.96 11/17/2015   No CKD last BUN/Cr: Lab Results  Component Value Date   BUN 9 07/15/2017   BUN 14 10/08/2016   CREATININE 0.75 07/15/2017   CREATININE 0.77 10/08/2016    Multiple myeloma and celiac disease work-up were negative: Component     Latest Ref Rng & Units 10/08/2016  Protein Urine Random     Not Estab. mg/dL 19.8  Albumin ELP, Urine     % 40.3  Alpha-1-Globulin, U     % 2.1  ALPHA-2-GLOBULIN, U     % 15.1  Beta Globulin, U     % 26.9  Gamma Globulin, U     % 15.7  M Component, Ur     Not Observed % Not Observed  Please Note:      Comment  Antigliadin Abs, IgA     0 - 19 units 2  Transglutaminase IgA     0 - 3 U/mL <2  IgA/Immunoglobulin A, Serum     90 - 386 mg/dL 140   ROS: Constitutional: no weight gain/no weight loss, no fatigue, no subjective hyperthermia, no subjective hypothermia Eyes: no blurry vision, no xerophthalmia ENT: no sore throat, no nodules palpated in neck, no dysphagia, no odynophagia, no hoarseness Cardiovascular: no CP/no SOB/no palpitations/no leg swelling Respiratory: no cough/no SOB/no wheezing Gastrointestinal: no N/no V/no D/no C/no acid reflux Musculoskeletal: no muscle aches/no joint aches Skin: no rashes, no hair loss Neurological: no tremors/no numbness/no tingling/no dizziness  I reviewed pt's medications, allergies, PMH, social hx, family hx, and changes were documented in the history of present illness. Otherwise, unchanged from my initial visit note.  Past Medical History:  Diagnosis Date  . Abdominal pain 12/01/2015  . Asthma   . Blood in urine   . Diabetes mellitus   . GERD (gastroesophageal  reflux disease)   . Hyperlipidemia    notes only hx of hyperlipidemia  . Loss of weight 12/06/2015  . MR (mental retardation), moderate   . MRSA (methicillin resistant staph aureus) culture positive   . Osteoporosis 08/18/2016  . Routine general medical examination at a health care facility 10/15/2012  Past Surgical History:  Procedure Laterality Date  . HIP SURGERY     Left  . INTRAMEDULLARY (IM) NAIL INTERTROCHANTERIC Left 04/10/2014  . INTRAMEDULLARY (IM) NAIL INTERTROCHANTERIC Left 04/10/2014   Procedure: INTRAMEDULLARY (IM) NAIL INTERTROCHANTRIC LEFT HIP;  Surgeon: Renette Butters, MD;  Location: [facility];  Service: Orthopedics;  Laterality: Left;    Social History   Socioeconomic History  . Marital status: Single    Spouse name: Not on file  . Number of children: Not on file  . Years of education: Not on file  . Highest education level: Not on file  Occupational History  . Not on file  Social Needs  . Financial resource strain: Not on file  . Food insecurity:    Worry: Not on file    Inability: Not on file  . Transportation needs:    Medical: Not on file    Non-medical: Not on file  Tobacco Use  . Smoking status: Never Smoker  . Smokeless tobacco: Never Used  Substance and Sexual Activity  . Alcohol use: No  . Drug use: No  . Sexual activity: Never  Lifestyle  . Physical activity:    Days per week: Not on file    Minutes per session: Not on file  . Stress: Not on file  Relationships  . Social connections:    Talks on phone: Not on file    Gets together: Not on file    Attends religious service: Not on file    Active member of club or organization: Not on file    Attends meetings of clubs or organizations: Not on file    Relationship status: Not on file  . Intimate partner violence:    Fear of current or ex partner: Not on file    Emotionally abused: Not on file    Physically abused: Not on file    Forced sexual activity: Not on file  Other Topics Concern   . Not on file  Social History Narrative  . Not on file    Current Outpatient Medications on File Prior to Visit  Medication Sig Dispense Refill  . ACCU-CHEK AVIVA PLUS test strip 1 each by Other route 2 (two) times daily. 100 each 12  . ACCU-CHEK FASTCLIX LANCETS MISC Check blood sugars 2 times daily PRN 306 each 12  . acetaminophen (TYLENOL) 500 MG tablet Take 500 mg by mouth every 4 (four) hours as needed (pain; fever >100).     Marland Kitchen albuterol (PROVENTIL HFA;VENTOLIN HFA) 108 (90 BASE) MCG/ACT inhaler Inhale 2 puffs into the lungs 4 (four) times daily as needed for wheezing or shortness of breath. Shortness of breath/wheezing    . alendronate (FOSAMAX) 70 MG tablet Take 1 tablet (70 mg total) by mouth every 7 (seven) days. Take with a full glass of water on an empty stomach. 15 tablet 3  . B-D UF III MINI PEN NEEDLES 31G X 5 MM MISC USE AS DIRECTED 100 each 11  . Calcium Carbonate (CALCIUM 600 PO) Take by mouth 2 (two) times daily.    . Cholecalciferol (VITAMIN D) 2000 units CAPS Take by mouth.    . diphenhydrAMINE (BENADRYL) 25 MG tablet Take 25-50 mg by mouth every 6 (six) hours as needed (allergic reaction). Take 2 tablets (50 mg) for ingestion of shellfish and seek emergency help.    . docusate sodium (COLACE) 100 MG capsule Take 1 capsule (100 mg total) by mouth 2 (two) times daily. Continue this while taking narcotics to help  with bowel movements (Patient taking differently: Take 100 mg by mouth 2 (two) times daily. Continue this while taking narcotics to help with bowel movements  PRN) 30 capsule 1  . fluticasone (FLONASE) 50 MCG/ACT nasal spray Place 1 spray into both nostrils daily as needed for allergies or rhinitis (PRN).     Marland Kitchen glipiZIDE (GLUCOTROL) 5 MG tablet Take 0.5 tablets (2.5 mg total) by mouth daily before supper. (Patient not taking: Reported on 11/18/2017) 10 tablet 5  . guaiFENesin-dextromethorphan (ROBITUSSIN DM) 100-10 MG/5ML syrup Take 15 mLs by mouth every 4 (four) hours  as needed for cough (cold symptoms). Reported on 08/17/2015    . ibuprofen (ADVIL,MOTRIN) 200 MG tablet Take 200 mg by mouth every 4 (four) hours as needed.    . Insulin Glargine (LANTUS SOLOSTAR) 100 UNIT/ML Solostar Pen Inject 9 Units into the skin daily at 10 pm. 5 pen 11  . Insulin Pen Needle (CAREFINE PEN NEEDLES) 32G X 4 MM MISC Use 1x a day 100 each 3  . loperamide (IMODIUM A-D) 2 MG tablet Take 2 mg by mouth as needed for diarrhea or loose stools. Take 2 caplets  or 4 tsp (20 mls) liquid by mouth after first loose stool and 1 caplet or 1 tsp (5 mls) liquid by mouth for each subsequent stool as needed; not to exceed 8 caplets or 50 mls in 24 hours    . loratadine (CLARITIN) 10 MG tablet Take 1 tablet (10 mg total) by mouth daily. 30 tablet 11  . losartan (COZAAR) 25 MG tablet TAKE 1 TABLET BY MOUTH ONCE DAILY. 31 tablet 6  . metFORMIN (GLUCOPHAGE) 1000 MG tablet TAKE 1 TABLET BY MOUTH TWICE DAILY WITH A MEAL. 180 tablet 3  . montelukast (SINGULAIR) 10 MG tablet TAKE 1 TABLET BY MOUTH ONCE DAILY. PRN 31 tablet 6  . ranitidine (ZANTAC) 300 MG tablet Take 1 tablet (300 mg total) by mouth at bedtime. 30 tablet 5  . saxagliptin HCl (ONGLYZA) 5 MG TABS tablet Take 1 tablet (5 mg total) by mouth daily. 90 tablet 3   No current facility-administered medications on file prior to visit.     Allergies  Allergen Reactions  . Shellfish Allergy Anaphylaxis  . Iohexol     IVP Dye    Family History  Problem Relation Age of Onset  . Alcohol abuse Father   . Cancer Maternal Grandmother   . Hyperlipidemia Maternal Grandmother   . Heart disease Maternal Grandmother   . Hypertension Maternal Grandmother   . Heart disease Maternal Grandfather   . Hypertension Maternal Grandfather   . Hyperlipidemia Maternal Grandfather   . Hyperlipidemia Paternal Grandmother   . Heart disease Paternal Grandmother   . Hypertension Paternal Grandmother   . Kidney disease Paternal Grandmother   . Diabetes Paternal  Grandmother   . Heart disease Paternal Grandfather   . Hypertension Paternal Grandfather   . Hyperlipidemia Paternal Grandfather   . Alcohol abuse Mother    Pt has FH of DM in father and Paternal GPs.  PE: BP 112/80   Pulse 64   Ht 5' 7" (1.702 m) Comment: measured  Wt 138 lb (62.6 kg)   SpO2 96%   BMI 21.61 kg/m  Wt Readings from Last 3 Encounters:  03/26/18 138 lb (62.6 kg)  11/18/17 145 lb 6.4 oz (66 kg)  07/16/17 142 lb 3.2 oz (64.5 kg)   Constitutional: Thin, in NAD, + significant kyphosis Eyes: PERRLA, EOMI, no exophthalmos ENT: moist mucous membranes, no thyromegaly,  no cervical lymphadenopathy Cardiovascular: RRR, No MRG Respiratory: CTA B Gastrointestinal: abdomen soft, NT, ND, BS+ Musculoskeletal: no deformities, strength intact in all 4 Skin: moist, warm, no rashes Neurological: no tremor with outstretched hands, DTR normal in all 4  ASSESSMENT: 1. DM1.5, insulin-dependent, uncontrolled, without long term complications  We investigated him for type I DM and GAD Abs were positive >> Type 1.5 DM Component     Latest Ref Rng & Units 10/08/2016  Glucose     65 - 99 mg/dL 73  Pancreatic Islet Cell Antibody     <5 JDF Units <5  Glutamic Acid Decarb Ab     <5 IU/mL 14 (H)  C-Peptide     0.80 - 3.85 ng/mL 1.29   2. Low BMD for age  53.  Numbness and tingling in feet  PLAN:  1. Patient with previously uncontrolled type 2 diabetes, with improvement of control after restarting long-acting insulin, even though we are using a low dose.  He has positive GAD antibodies with normal insulin production.  Latest HbA1c was at goal, is 6.7%.  At last visit, per records brought by his aunt, his sugars were mostly at goal with very few hyperglycemic spikes.  They could not explain these spikes.  We did not change the regimen at that time.  Last visit, I gave him instructions about how to take the medicines around the time of his upcoming tooth extraction.  This went well, without  any problems. -At this visit, sugars are great, with the exception of post dinner sugars whenever he goes out to eat.  We discussed about increasing the glipizide before these meals from 2.5 to 5 mg.  Otherwise, no changes are needed in his regimen - I suggested to:  Patient Instructions  Please continue: - Metformin 1000 mg 2x a day, with meals - Onglyza 5 mg daily in am - Lantus 9 units at bedtime  Please increase: - Glipizide to 5 mg before a large dinner   Please stop at the lab.  Please return in 4 months with your sugar log.   - today, HbA1c is 6.0% (improved) - continue checking sugars at different times of the day - check 1x a day, rotating checks - advised for yearly eye exams >> he is UTD - UTD with flu shot - Return to clinic in 4 mo with sugar log     2. Low BMD for age Patient had a hip fracture while rollerskating 2015.  At that time, but DXA scan showed mild low bone mineral density for age.  He was started on calcium and vitamin D at that time.  He continues these now.  We repeated DXA scan in 2018 and it showed worsening Z scores. -Investigation for secondary causes for osteoporosis was negative: No vitamin D deficiency, no kidney disease, thyroid disease, hypophosphatasia, anemia, celiac disease, multiple myeloma, hyperparathyroidism, 24 hydroxylase deficiency, calcium malabsorption. -Due to the history of fracture and declining Z scores, we started him on Fosamax 70 mg weekly, which he continues without side effects. I discussed with the patient and his aunt when we initiated therapy about the possible risk for ONJ and atypical fracture.  He does not have any jaw or hip pain. -Plan is to repeat DXA scan in 08/2018, 2 years after starting Fosamax. -At this visit, we will check his vitamin D - he is on vitamin D 2000 units daily, which we will continue  3.  Numbness and tingling in feet -Could be related to  diabetes, even with improved control, however, I would also  like to check a B12 level, which can be reduced by chronic metformin treatment  Office Visit on 03/26/2018  Component Date Value Ref Range Status  . Vitamin B-12 03/26/2018 458  211 - 911 pg/mL Final  . VITD 03/26/2018 82.12  30.00 - 100.00 ng/mL Final  . Hemoglobin A1C 03/26/2018 6.0* 4.0 - 5.6 % Final   Vitamin levels are normal.  Philemon Kingdom, MD PhD [facility] Endocrinology

## 2018-05-19 DIAGNOSIS — M2041 Other hammer toe(s) (acquired), right foot: Secondary | ICD-10-CM | POA: Diagnosis not present

## 2018-05-19 DIAGNOSIS — M79675 Pain in left toe(s): Secondary | ICD-10-CM | POA: Diagnosis not present

## 2018-05-19 DIAGNOSIS — E0840 Diabetes mellitus due to underlying condition with diabetic neuropathy, unspecified: Secondary | ICD-10-CM | POA: Diagnosis not present

## 2018-05-19 DIAGNOSIS — B351 Tinea unguium: Secondary | ICD-10-CM | POA: Diagnosis not present

## 2018-07-07 ENCOUNTER — Telehealth: Payer: Self-pay | Admitting: *Deleted

## 2018-07-07 NOTE — Telephone Encounter (Signed)
Received Physician Orders from [place]; forwarded to provider/SLS 02/04

## 2018-07-21 DIAGNOSIS — F79 Unspecified intellectual disabilities: Secondary | ICD-10-CM | POA: Diagnosis not present

## 2018-07-21 DIAGNOSIS — J4520 Mild intermittent asthma, uncomplicated: Secondary | ICD-10-CM | POA: Diagnosis not present

## 2018-07-21 DIAGNOSIS — E1165 Type 2 diabetes mellitus with hyperglycemia: Secondary | ICD-10-CM | POA: Diagnosis not present

## 2018-07-21 DIAGNOSIS — M810 Age-related osteoporosis without current pathological fracture: Secondary | ICD-10-CM | POA: Diagnosis not present

## 2018-07-28 ENCOUNTER — Other Ambulatory Visit: Payer: Self-pay

## 2018-07-28 ENCOUNTER — Ambulatory Visit (INDEPENDENT_AMBULATORY_CARE_PROVIDER_SITE_OTHER): Payer: Medicare Other | Admitting: Internal Medicine

## 2018-07-28 ENCOUNTER — Encounter: Payer: Self-pay | Admitting: Internal Medicine

## 2018-07-28 VITALS — BP 110/70 | HR 77 | Ht 67.0 in | Wt 138.0 lb

## 2018-07-28 DIAGNOSIS — H52223 Regular astigmatism, bilateral: Secondary | ICD-10-CM | POA: Diagnosis not present

## 2018-07-28 DIAGNOSIS — R200 Anesthesia of skin: Secondary | ICD-10-CM

## 2018-07-28 DIAGNOSIS — M8580 Other specified disorders of bone density and structure, unspecified site: Secondary | ICD-10-CM | POA: Diagnosis not present

## 2018-07-28 DIAGNOSIS — Z794 Long term (current) use of insulin: Secondary | ICD-10-CM | POA: Diagnosis not present

## 2018-07-28 DIAGNOSIS — E1165 Type 2 diabetes mellitus with hyperglycemia: Secondary | ICD-10-CM | POA: Diagnosis not present

## 2018-07-28 DIAGNOSIS — H5203 Hypermetropia, bilateral: Secondary | ICD-10-CM | POA: Diagnosis not present

## 2018-07-28 DIAGNOSIS — E119 Type 2 diabetes mellitus without complications: Secondary | ICD-10-CM | POA: Diagnosis not present

## 2018-07-28 DIAGNOSIS — M859 Disorder of bone density and structure, unspecified: Secondary | ICD-10-CM

## 2018-07-28 DIAGNOSIS — R202 Paresthesia of skin: Secondary | ICD-10-CM

## 2018-07-28 DIAGNOSIS — Z7984 Long term (current) use of oral hypoglycemic drugs: Secondary | ICD-10-CM | POA: Diagnosis not present

## 2018-07-28 LAB — POCT GLYCOSYLATED HEMOGLOBIN (HGB A1C): Hemoglobin A1C: 7.7 % — AB (ref 4.0–5.6)

## 2018-07-28 MED ORDER — ACCU-CHEK AVIVA PLUS VI STRP: ORAL_STRIP | 12 refills | Status: DC

## 2018-07-28 MED ORDER — ACCU-CHEK FASTCLIX LANCETS MISC: 12 refills | Status: DC

## 2018-07-28 MED ORDER — INSULIN GLARGINE 100 UNIT/ML SOLOSTAR PEN: 11.0000 [IU] | PEN_INJECTOR | Freq: Every day | SUBCUTANEOUS | 11 refills | Status: DC

## 2018-07-28 NOTE — Patient Instructions (Addendum)
Please continue: - Metformin 1000 mg 2x a day, with meals - Glipizide 5 mg  30 min before a large dinner  - Onglyza 5 mg daily in am  Please increase: - Lantus to 11 units at bedtime  Please continue Fosamax 70 mg weekly.  We will need to schedule a new bone density scan for you.  Please return in 4 months with your sugar log.

## 2018-07-28 NOTE — Addendum Note (Signed)
Addended by: Cardell Peach I on: 07/28/2018 12:43 PM   Modules accepted: Orders

## 2018-07-28 NOTE — Progress Notes (Signed)
Patient ID: Adam Fitzpatrick, adult   DOB: 1981/03/12, 38 y.o.   MRN: [id number]   HPI: Adam Fitzpatrick) is a 38 y.o. man, returning for f/u for DM1.5, dx as DM2 in 2011, insulin-dependent, uncontrolled, without long-term complications and low BMD for age.  He is here with his aunt who offers almost all of the history as patient is mostly nonverbal.  Last visit 4 months ago.  DM1.5:  Reviewed HbA1c levels: Lab Results  Component Value Date   HGBA1C 6.0 (A) 03/26/2018   HGBA1C 6.7 (A) 11/18/2017   HGBA1C 6.6 07/16/2017   HGBA1C 6.8 (H) 07/15/2017   HGBA1C 7.5 04/15/2017   HGBA1C 7.5 01/28/2017   HGBA1C 8.8 (H) 08/13/2016   HGBA1C 7.4 (H) 11/17/2015   HGBA1C 9.0 (H) 06/09/2015   HGBA1C 6.5 02/09/2014   He is on: - Metformin 1000 mg 2x a day, with meals - Glipizide 5 mg only before a large dinner  - Onglyza 5 mg daily in am - Lantus 9 units at bedtime  Checks his sugars once a day in the group home: - am: 70-159, 210, 329 >> 67, 73-122, 148 >> 85-150, 218, 265 - 2h after b'fast: n/c >> 138, 145 >> n/c - before lunch: n/c >> 145 >> n/c - 2h after lunch: n/c >> 128 >> n/c - before dinner: n/c >> 138 >> n/c - 2h after dinner: 87-178, eating out: 220-285 >> 78-184, 269, 315, 335 - bedtime: n/c - nighttime: n/c Lowest sugar was 67 >> 78; it is unclear at which level he has hypoglycemia awareness. Highest sugar was 285 >> 335.  Glucometer: AccuChek Aviva  Pt's meals are: - Breakfast: oatmeal, eggs, bacon, sausage, pancakes (sugarfree syrup) - Lunch: 2 PB sandwiches on wheat - Dinner: low carb - Snacks: before bedtime He lost a significant amount of weight in last few years: 25-30 lbs-but his weight stabilized after starting insulin.  However, before last visit, he lost another 7 pounds.  Weight is stable since last visit.  -No CKD, last BUN/creatinine:  Lab Results  Component Value Date   BUN 9 07/15/2017   BUN 14 10/08/2016   CREATININE 0.75 07/15/2017   CREATININE  0.77 10/08/2016  On losartan. -last set of lipids normal: Lab Results  Component Value Date   CHOL 161 07/15/2017   HDL 53.20 07/15/2017   LDLCALC 88 07/15/2017   TRIG 98.0 07/15/2017   CHOLHDL 3 07/15/2017  He is not on a statin. - last eye exam was in 10/2016: No DR.  Dr. Nancee Liter in [place]. - + numbness and tingling in his feet.  At last visit, vitamin B-12 level was normal: Lab Results  Component Value Date   VITAMINB12 458 03/26/2018   VITAMINB12 549 11/17/2015    Low BMD for age:  Reviewed and addended history: Reviewed available DXA scans: Z-score Lumbar spine (L1-L4) Femoral neck (FN) 33% distal radius UD radius  08/13/2016 -2.6 RFN: -2.4 LFN: n/a -0.1 n/a  05/04/2014 -2.1 RFN: -2.2 +0.6 +0.1  No dizziness/vertigo/orthostasis.  She had a left hip fracture in 2015 while [place]kating [place] [employer].  Vitamin D level from last visit was normal: Lab Results  Component Value Date   VD25OH 82.12 03/26/2018   VD25OH 72.84 11/17/2015   Calcitriol level was normal: Component     Latest Ref Rng & Units 10/08/2016  Vitamin D 1, 25 (OH) Total     18 - 72 pg/mL 31  Vitamin D3 1, 25 (OH)  pg/mL reviewed and addended history: 31  Vitamin D2 1, 25 (OH)     pg/mL <8   He continues on calcium 600 mg twice a day and vitamin D 2000 units daily.  We started Fosamax 11/2016.  No side effects.  He had dental extraction before last visit, which healed well, without any problems.  No weightbearing exercises.  He is not taking vitamin A.  Pt does have a FH of osteoporosis: MGM.  No hypo-or hypercalcemia or hyperparathyroidism.  No history of kidney stones. Lab Results  Component Value Date   PTH 21 10/08/2016   CALCIUM 10.5 07/15/2017   CALCIUM 9.8 10/08/2016   CALCIUM 10.3 12/01/2015   CALCIUM 9.8 11/17/2015   CALCIUM 10.2 06/09/2015   His 24-hour urine calcium was normal: Component     Latest Ref Rng & Units 10/14/2016  Creatinine, Urine     20 -  370 mg/dL 121  Creatinine, 24H Ur     0.63 - 2.50 g/24 h 1.57  Calcium, Ur     Not estab mg/dL 23  Calcium, 24 hour urine     55 - 300 mg/24 h 299   No thyrotoxicosis.  Reviewed TSH levels: Lab Results  Component Value Date   TSH 1.35 07/15/2017   TSH 1.26 08/13/2016   TSH 0.96 11/17/2015   No CKD.  Last BUN/creatinine: Lab Results  Component Value Date   BUN 9 07/15/2017   BUN 14 10/08/2016   CREATININE 0.75 07/15/2017   CREATININE 0.77 10/08/2016    Multiple myeloma celiac disease work-up were negative: Component     Latest Ref Rng & Units 10/08/2016  Protein Urine Random     Not Estab. mg/dL 19.8  Albumin ELP, Urine     % 40.3  Alpha-1-Globulin, U     % 2.1  ALPHA-2-GLOBULIN, U     % 15.1  Beta Globulin, U     % 26.9  Gamma Globulin, U     % 15.7  M Component, Ur     Not Observed % Not Observed  Please Note:      Comment  Antigliadin Abs, IgA     0 - 19 units 2  Transglutaminase IgA     0 - 3 U/mL <2  IgA/Immunoglobulin A, Serum     90 - 386 mg/dL 140   ROS: Constitutional: no weight gain/no weight loss, no fatigue, no subjective hyperthermia, no subjective hypothermia Eyes: no blurry vision, no xerophthalmia ENT: no sore throat, no nodules palpated in neck, no dysphagia, no odynophagia, no hoarseness Cardiovascular: no CP/no SOB/no palpitations/no leg swelling Respiratory: no cough/no SOB/no wheezing Gastrointestinal: no N/no V/no D/no C/no acid reflux Musculoskeletal: no muscle aches/no joint aches Skin: no rashes, no hair loss Neurological: no tremors/+ numbness/+ tingling/no dizziness  I reviewed pt's medications, allergies, PMH, social hx, family hx, and changes were documented in the history of present illness. Otherwise, unchanged from my initial visit note.  Past Medical History:  Diagnosis Date  . Abdominal pain 12/01/2015  . Asthma   . Blood in urine   . Diabetes mellitus   . GERD (gastroesophageal reflux disease)   . Hyperlipidemia     notes only hx of hyperlipidemia  . Loss of weight 12/06/2015  . MR (mental retardation), moderate   . MRSA (methicillin resistant staph aureus) culture positive   . Osteoporosis 08/18/2016  . Routine general medical examination at a health care facility 10/15/2012    Past Surgical History:  Procedure Laterality Date  .  HIP SURGERY     Left  . INTRAMEDULLARY (IM) NAIL INTERTROCHANTERIC Left 04/10/2014  . INTRAMEDULLARY (IM) NAIL INTERTROCHANTERIC Left 04/10/2014   Procedure: INTRAMEDULLARY (IM) NAIL INTERTROCHANTRIC LEFT HIP;  Surgeon: Renette Butters, MD;  Location: [facility];  Service: Orthopedics;  Laterality: Left;    Social History   Socioeconomic History  . Marital status: Single    Spouse name: Not on file  . Number of children: Not on file  . Years of education: Not on file  . Highest education level: Not on file  Occupational History  . Not on file  Social Needs  . Financial resource strain: Not on file  . Food insecurity:    Worry: Not on file    Inability: Not on file  . Transportation needs:    Medical: Not on file    Non-medical: Not on file  Tobacco Use  . Smoking status: Never Smoker  . Smokeless tobacco: Never Used  Substance and Sexual Activity  . Alcohol use: No  . Drug use: No  . Sexual activity: Never  Lifestyle  . Physical activity:    Days per week: Not on file    Minutes per session: Not on file  . Stress: Not on file  Relationships  . Social connections:    Talks on phone: Not on file    Gets together: Not on file    Attends religious service: Not on file    Active member of club or organization: Not on file    Attends meetings of clubs or organizations: Not on file    Relationship status: Not on file  . Intimate partner violence:    Fear of current or ex partner: Not on file    Emotionally abused: Not on file    Physically abused: Not on file    Forced sexual activity: Not on file  Other Topics Concern  . Not on file  Social History  Narrative  . Not on file    Current Outpatient Medications on File Prior to Visit  Medication Sig Dispense Refill  . ACCU-CHEK AVIVA PLUS test strip 1 each by Other route 2 (two) times daily. 100 each 12  . ACCU-CHEK FASTCLIX LANCETS MISC Check blood sugars 2 times daily PRN 306 each 12  . acetaminophen (TYLENOL) 500 MG tablet Take 500 mg by mouth every 4 (four) hours as needed (pain; fever >100).     Marland Kitchen albuterol (PROVENTIL HFA;VENTOLIN HFA) 108 (90 BASE) MCG/ACT inhaler Inhale 2 puffs into the lungs 4 (four) times daily as needed for wheezing or shortness of breath. Shortness of breath/wheezing    . alendronate (FOSAMAX) 70 MG tablet Take 1 tablet (70 mg total) by mouth every 7 (seven) days. Take with a full glass of water on an empty stomach. 15 tablet 3  . B-D UF III MINI PEN NEEDLES 31G X 5 MM MISC USE AS DIRECTED 100 each 11  . Calcium Carbonate (CALCIUM 600 PO) Take by mouth 2 (two) times daily.    . Cholecalciferol (VITAMIN D) 2000 units CAPS Take by mouth.    . diphenhydrAMINE (BENADRYL) 25 MG tablet Take 25-50 mg by mouth every 6 (six) hours as needed (allergic reaction). Take 2 tablets (50 mg) for ingestion of shellfish and seek emergency help.    . docusate sodium (COLACE) 100 MG capsule Take 1 capsule (100 mg total) by mouth 2 (two) times daily. Continue this while taking narcotics to help with bowel movements (Patient taking differently: Take 100 mg  by mouth 2 (two) times daily. Continue this while taking narcotics to help with bowel movements  PRN) 30 capsule 1  . fluticasone (FLONASE) 50 MCG/ACT nasal spray Place 1 spray into both nostrils daily as needed for allergies or rhinitis (PRN).     Marland Kitchen glipiZIDE (GLUCOTROL) 5 MG tablet Take 1 tablet (5 mg total) by mouth daily before supper. 20 tablet 5  . guaiFENesin-dextromethorphan (ROBITUSSIN DM) 100-10 MG/5ML syrup Take 15 mLs by mouth every 4 (four) hours as needed for cough (cold symptoms). Reported on 08/17/2015    . ibuprofen  (ADVIL,MOTRIN) 200 MG tablet Take 200 mg by mouth every 4 (four) hours as needed.    . Insulin Glargine (LANTUS SOLOSTAR) 100 UNIT/ML Solostar Pen Inject 9 Units into the skin daily at 10 pm. 5 pen 11  . Insulin Pen Needle (CAREFINE PEN NEEDLES) 32G X 4 MM MISC Use 1x a day 100 each 3  . loperamide (IMODIUM A-D) 2 MG tablet Take 2 mg by mouth as needed for diarrhea or loose stools. Take 2 caplets  or 4 tsp (20 mls) liquid by mouth after first loose stool and 1 caplet or 1 tsp (5 mls) liquid by mouth for each subsequent stool as needed; not to exceed 8 caplets or 50 mls in 24 hours    . loratadine (CLARITIN) 10 MG tablet Take 1 tablet (10 mg total) by mouth daily. 30 tablet 11  . losartan (COZAAR) 25 MG tablet TAKE 1 TABLET BY MOUTH ONCE DAILY. 31 tablet 6  . metFORMIN (GLUCOPHAGE) 1000 MG tablet TAKE 1 TABLET BY MOUTH TWICE DAILY WITH A MEAL. 180 tablet 3  . montelukast (SINGULAIR) 10 MG tablet TAKE 1 TABLET BY MOUTH ONCE DAILY. PRN 31 tablet 6  . ranitidine (ZANTAC) 300 MG tablet Take 1 tablet (300 mg total) by mouth at bedtime. 30 tablet 5  . saxagliptin HCl (ONGLYZA) 5 MG TABS tablet Take 1 tablet (5 mg total) by mouth daily. 90 tablet 3   No current facility-administered medications on file prior to visit.     Allergies  Allergen Reactions  . Shellfish Allergy Anaphylaxis  . Iohexol     IVP Dye    Family History  Problem Relation Age of Onset  . Alcohol abuse Father   . Cancer Maternal Grandmother   . Hyperlipidemia Maternal Grandmother   . Heart disease Maternal Grandmother   . Hypertension Maternal Grandmother   . Heart disease Maternal Grandfather   . Hypertension Maternal Grandfather   . Hyperlipidemia Maternal Grandfather   . Hyperlipidemia Paternal Grandmother   . Heart disease Paternal Grandmother   . Hypertension Paternal Grandmother   . Kidney disease Paternal Grandmother   . Diabetes Paternal Grandmother   . Heart disease Paternal Grandfather   . Hypertension  Paternal Grandfather   . Hyperlipidemia Paternal Grandfather   . Alcohol abuse Mother    Pt has FH of DM in father and Paternal GPs.  PE: BP 110/70   Pulse 77   Ht 5' 7"  (1.702 m)   Wt 138 lb (62.6 kg)   SpO2 97%   BMI 21.61 kg/m  Wt Readings from Last 3 Encounters:  07/28/18 138 lb (62.6 kg)  03/26/18 138 lb (62.6 kg)  11/18/17 145 lb 6.4 oz (66 kg)   Constitutional: Thin, in NAD, + significant kyphosis Eyes: PERRLA, EOMI, no exophthalmos ENT: moist mucous membranes, no thyromegaly, no cervical lymphadenopathy Cardiovascular: RRR, No MRG Respiratory: CTA B Gastrointestinal: abdomen soft, NT, ND, BS+ Musculoskeletal: +  Kyphosis, strength intact in all 4 Skin: moist, warm, no rashes Neurological: no tremor with outstretched hands, DTR normal in all 4  ASSESSMENT: 1. DM1.5, insulin-dependent, uncontrolled, without long term complications  We investigated him for type I DM and GAD Abs were positive >> Type 1.5 DM Component     Latest Ref Rng & Units 10/08/2016  Glucose     65 - 99 mg/dL 73  Pancreatic Islet Cell Antibody     <5 JDF Units <5  Glutamic Acid Decarb Ab     <5 IU/mL 14 (H)  C-Peptide     0.80 - 3.85 ng/mL 1.29   2. Low BMD for age  81.  Numbness and tingling in feet  PLAN:  1. Patient with previously uncontrolled type 2 diabetes, with improvement of control after restarting long-acting insulin, even on a low dose.  He has positive GAD antibodies with normal insulin production.  Latest HbA1c was excellent, at 6%.  Sugars at last visit were great, with the exception of post dinner sugars whenever he went out to eat.  I advised him to increase the glipizide dose before these meals from 2.5 to 5 mg.  Otherwise, we did not change the regimen. -Since last visit, sugars are higher and especially when he is eating out.  Even had 2 sugars in the 300s.  It is unclear whether he is always given glipizide before these meals.  We decided to increase the Lantus slightly  since sugars in the morning are also higher than at last visit, and advised him to give glipizide 30 minutes before a large meal.  At next visit, if he still has significant spikes, he will need to start mealtime insulin at least with the meals eaten out. - I suggested to:  Patient Instructions  Please continue: - Metformin 1000 mg 2x a day, with meals - Glipizide 5 mg  30 min before a large dinner  - Onglyza 5 mg daily in am  Please increase: - Lantus to 11 units at bedtime  Please continue Fosamax 70 mg weekly.  We will need to schedule a new bone density scan for you.  Please return in 4 months with your sugar log.   - today, HbA1c is 7.7% (higher) - continue checking sugars at different times of the day - check 1x a day, rotating checks - advised for yearly eye exams >> he is not UTD - Return to clinic in 4 mo with sugar log      2. Low BMD for age - Patient had a hip fracture while rollerskating 2015.  At that time, DXA scan showed mild low bone mineral density for age.  We started calcium and vitamin D 2000 units daily and he continues on these now.  Vitamin D was normal at last visit.  We repeated a DXA scan in 2018 and this showed worsening Z scores.  At that time, I suggested starting Fosamax 70 mg weekly.  He did so and had no side effects from it.  He has now completed 2 years of Fosamax and we plan to repeat the new DEXA scan in 08/2018, to check improvement on the bisphosphonate. If the scores are not better we will need to start parenteral therapy with either Reclast or Prolia. -Investigation for secondary causes for osteoporosis was negative: No vitamin D deficiency, no kidney disease, thyroid disease, hypophosphatasia, anemia, celiac disease, multiple myeloma, hyperparathyroidism, 24 hydroxylase deficiency, calcium malabsorption.  3.  Numbness and tingling in feet -Possibly related  to diabetes -At last visit we checked a B12 level and this was normal  Philemon Kingdom,  MD PhD [facility]

## 2018-08-06 ENCOUNTER — Ambulatory Visit: Payer: Self-pay | Admitting: Family Medicine

## 2018-08-11 DIAGNOSIS — M79675 Pain in left toe(s): Secondary | ICD-10-CM | POA: Diagnosis not present

## 2018-08-11 DIAGNOSIS — M2041 Other hammer toe(s) (acquired), right foot: Secondary | ICD-10-CM | POA: Diagnosis not present

## 2018-08-11 DIAGNOSIS — E0840 Diabetes mellitus due to underlying condition with diabetic neuropathy, unspecified: Secondary | ICD-10-CM | POA: Diagnosis not present

## 2018-08-11 DIAGNOSIS — B351 Tinea unguium: Secondary | ICD-10-CM | POA: Diagnosis not present

## 2018-09-22 ENCOUNTER — Telehealth: Payer: Self-pay | Admitting: Internal Medicine

## 2018-09-22 MED ORDER — INSULIN GLARGINE 100 UNIT/ML SOLOSTAR PEN: 11.0000 [IU] | PEN_INJECTOR | Freq: Every day | SUBCUTANEOUS | 0 refills | Status: DC

## 2018-09-22 NOTE — Telephone Encounter (Signed)
Patients emergency contact has called in regards to increasing the supply refill to 90 days.  Please Advise, Thanks

## 2018-09-22 NOTE — Telephone Encounter (Signed)
90 day supply sent to local pharmacy.

## 2018-09-22 NOTE — Telephone Encounter (Signed)
MEDICATION: lantus solostar  PHARMACY:  [facility]-F#[phone]  IS THIS A 90 DAY SUPPLY :   IS PATIENT OUT OF MEDICATION: no  IF NOT; HOW MUCH IS LEFT: 2 days worth  LAST APPOINTMENT DATE: @2 /25/2020  NEXT APPOINTMENT DATE:@6 /30/2020  DO WE HAVE YOUR PERMISSION TO LEAVE A DETAILED MESSAGE:  OTHER COMMENTS: pt just needs [place] to call in one pen to hold him over until he gets his usual shipment from [facility]   **Let patient know to contact pharmacy at the end of the day to make sure medication is ready. **  ** Please notify patient to allow 48-72 hours to process**  **Encourage patient to contact the pharmacy for refills or they can request refills through [facility]**

## 2018-09-22 NOTE — Addendum Note (Signed)
Addended by: Cardell Peach I on: 09/22/2018 02:41 PM   Modules accepted: Orders

## 2018-11-06 ENCOUNTER — Other Ambulatory Visit: Payer: Self-pay | Admitting: Internal Medicine

## 2018-11-30 ENCOUNTER — Telehealth: Payer: Self-pay | Admitting: Internal Medicine

## 2018-11-30 DIAGNOSIS — E1165 Type 2 diabetes mellitus with hyperglycemia: Secondary | ICD-10-CM

## 2018-11-30 NOTE — Telephone Encounter (Signed)
Order placed and printed to fax for a1c

## 2018-11-30 NOTE — Telephone Encounter (Signed)
Pts mom rescheduled appt due to covid and even on the rescheduled appt in August does not actually want him to come in so set up for virtual.  She is willing to bring him to [facility] for an A1C if we send them an order to Fax # [phone] Imaging for dexa if needed phone # [phone]  Fax for labs [phone]  Would need the diagnostic codes MD name and the patients name and dob for the 2 identifiers   If we order dexa and labs can it be either in the am or pm not split up please

## 2018-11-30 NOTE — Telephone Encounter (Signed)
The order for the DEXA in the system.

## 2018-11-30 NOTE — Addendum Note (Signed)
Addended by: Cardell Peach I on: 11/30/2018 01:58 PM   Modules accepted: Orders

## 2018-12-01 ENCOUNTER — Ambulatory Visit: Payer: Self-pay | Admitting: Internal Medicine

## 2019-01-12 ENCOUNTER — Other Ambulatory Visit: Payer: Self-pay | Admitting: Internal Medicine

## 2019-01-18 ENCOUNTER — Telehealth: Payer: Self-pay | Admitting: Internal Medicine

## 2019-01-18 NOTE — Telephone Encounter (Signed)
Patient's POA Aunt Patsy requests that you call ph# [phone] to go over meds, etc prior to Doxy appointment on 01/19/19

## 2019-01-19 ENCOUNTER — Other Ambulatory Visit: Payer: Self-pay

## 2019-01-19 ENCOUNTER — Ambulatory Visit (INDEPENDENT_AMBULATORY_CARE_PROVIDER_SITE_OTHER): Payer: Medicare Other | Admitting: Internal Medicine

## 2019-01-19 ENCOUNTER — Encounter: Payer: Self-pay | Admitting: Internal Medicine

## 2019-01-19 DIAGNOSIS — Z794 Long term (current) use of insulin: Secondary | ICD-10-CM | POA: Diagnosis not present

## 2019-01-19 DIAGNOSIS — M859 Disorder of bone density and structure, unspecified: Secondary | ICD-10-CM

## 2019-01-19 DIAGNOSIS — E139 Other specified diabetes mellitus without complications: Secondary | ICD-10-CM

## 2019-01-19 DIAGNOSIS — E1165 Type 2 diabetes mellitus with hyperglycemia: Secondary | ICD-10-CM | POA: Diagnosis not present

## 2019-01-19 DIAGNOSIS — M8580 Other specified disorders of bone density and structure, unspecified site: Secondary | ICD-10-CM | POA: Diagnosis not present

## 2019-01-19 MED ORDER — METFORMIN HCL 1000 MG PO TABS: ORAL_TABLET | ORAL | 3 refills | Status: DC

## 2019-01-19 NOTE — Telephone Encounter (Signed)
LM for her to call me back.  

## 2019-01-19 NOTE — Progress Notes (Signed)
Patient ID: CARRON JAGGI, adult   DOB: [birth year], 38 y.o.   MRN: [id number]   Patient location: Home My location: Office  Referring Provider: Mosie Lukes, MD  I connected with the patient and aunt on 01/19/19 at 11:28 AM EDT by a video enabled telemedicine application and verified that I am speaking with the correct person.   I discussed the limitations of evaluation and management by telemedicine and the availability of in person appointments. The patient expressed understanding and agreed to proceed.   Details of the encounter are shown below.  HPI: JAVIAN NUDD) is a 38 y.o. man, returning for f/u for DM1.5, dx as DM2 in 2011, insulin-dependent, uncontrolled, without long-term complications and low BMD for age.  History is offered by aunt as patient is mostly nonverbal.  Last visit 6 months ago.  DM1.5:  Reviewed HbA1c levels: Lab Results  Component Value Date   HGBA1C 7.7 (A) 07/28/2018   HGBA1C 6.0 (A) 03/26/2018   HGBA1C 6.7 (A) 11/18/2017   HGBA1C 6.6 07/16/2017   HGBA1C 6.8 (H) 07/15/2017   HGBA1C 7.5 04/15/2017   HGBA1C 7.5 01/28/2017   HGBA1C 8.8 (H) 08/13/2016   HGBA1C 7.4 (H) 11/17/2015   HGBA1C 9.0 (H) 06/09/2015   He is on: - Metformin 1000 mg 2x a day, with meals - Glipizide 5 mg  30 minutes before a large dinner  - Onglyza 5 mg daily in am - Lantus 9 >> 11 units at bedtime (increased 07/2018)  Checks his sugars once a day in the group home: - am: 67, 73-122, 148 >> 85-150, 218, 265 >> 95-141 (ave 130-140) - 2h after b'fast: n/c >> 138, 145 >> n/c - before lunch: n/c >> 145 >> n/c - 2h after lunch: n/c >> 128 >> n/c - before dinner: n/c >> 138 >> n/c - 2h after dinner: 78-184, 269, 315, 335 >> 100-277 (ave 140) - bedtime: n/c - nighttime: n/c Lowest sugar was 67 >> 78 >> 95; it is unclear at which level he has hypoglycemia awareness. Highest sugar was 285 >> 335 >> 277.  Glucometer: AccuChek Aviva  Pt's meals are: - Breakfast:  oatmeal, eggs, bacon, sausage, pancakes (sugarfree syrup) - Lunch: 2 PB sandwiches on wheat - Dinner: low carb - Snacks: before bedtime He lost a significant amount of weight in last few years: 25-30 lbs-but his weight stabilized after starting insulin.  However, before last visit, he lost another 7 pounds.  Weight is stable since last visit.  - no CKD, last BUN/creatinine:  Lab Results  Component Value Date   BUN 9 07/15/2017   BUN 14 10/08/2016   CREATININE 0.75 07/15/2017   CREATININE 0.77 10/08/2016  On losartan. -No HL; last set of lipids was normal: Lab Results  Component Value Date   CHOL 161 07/15/2017   HDL 53.20 07/15/2017   LDLCALC 88 07/15/2017   TRIG 98.0 07/15/2017   CHOLHDL 3 07/15/2017  He is not on a statin. - last eye exam was in 10/2016: No DR.  Dr. Nancee Liter in [place]. -+ Numbness and tingling in his feet.  Vitamin B12 level was normal: Lab Results  Component Value Date   VITAMINB12 458 03/26/2018   VITAMINB12 549 11/17/2015    Low BMD for age:  Reviewed and addended history: Reviewed the report of available DXA scans: Z-score Lumbar spine (L1-L4) Femoral neck (FN) 33% distal radius UD radius  08/13/2016 -2.6 RFN: -2.4 LFN: n/a -0.1 n/a  05/04/2014 -2.1 RFN: -2.2 +0.6 +  0.1  He denies dizziness/vertigo/orthostasis.  He had a left hip fracture in 2018 while [place] [employer].  Vitamin D level was normal at last check: Lab Results  Component Value Date   VD25OH 82.12 03/26/2018   VD25OH 72.84 11/17/2015   Calcitriol level was normal: Component     Latest Ref Rng & Units 10/08/2016  Vitamin D 1, 25 (OH) Total     18 - 72 pg/mL 31  Vitamin D3 1, 25 (OH)     pg/mL reviewed and addended history: 31  Vitamin D2 1, 25 (OH)     pg/mL <8   He continues on calcium 600 mg twice a day and vitamin D 2000 units daily.  We started Fosamax 11/2016.  No side effects.  He had a dental extraction since then, which healed without  problems.  No weightbearing exercises.  He is not on vitamin A supplements.  Pt does have a FH of osteoporosis: MGM.  No hypo-or hypercalcemia or hyperparathyroidism.  No history of kidney stones. Lab Results  Component Value Date   PTH 21 10/08/2016   CALCIUM 10.5 07/15/2017   CALCIUM 9.8 10/08/2016   CALCIUM 10.3 12/01/2015   CALCIUM 9.8 11/17/2015   CALCIUM 10.2 06/09/2015   24-hour urine calcium was normal: Component     Latest Ref Rng & Units 10/14/2016  Creatinine, Urine     20 - 370 mg/dL 121  Creatinine, 24H Ur     0.63 - 2.50 g/24 h 1.57  Calcium, Ur     Not estab mg/dL 23  Calcium, 24 hour urine     55 - 300 mg/24 h 299   No thyrotoxicosis.  Reviewed TSH levels: Lab Results  Component Value Date   TSH 1.35 07/15/2017   TSH 1.26 08/13/2016   TSH 0.96 11/17/2015   No CKD.  Reviewed BUN/creatinine levels: Lab Results  Component Value Date   BUN 9 07/15/2017   BUN 14 10/08/2016   CREATININE 0.75 07/15/2017   CREATININE 0.77 10/08/2016    Multiple myeloma celiac disease work-up were negative: Component     Latest Ref Rng & Units 10/08/2016  Protein Urine Random     Not Estab. mg/dL 19.8  Albumin ELP, Urine     % 40.3  Alpha-1-Globulin, U     % 2.1  ALPHA-2-GLOBULIN, U     % 15.1  Beta Globulin, U     % 26.9  Gamma Globulin, U     % 15.7  M Component, Ur     Not Observed % Not Observed  Please Note:      Comment  Antigliadin Abs, IgA     0 - 19 units 2  Transglutaminase IgA     0 - 3 U/mL <2  IgA/Immunoglobulin A, Serum     90 - 386 mg/dL 140   ROS: Constitutional: no weight gain/no weight loss, no fatigue, no subjective hyperthermia, no subjective hypothermia Eyes: no blurry vision, no xerophthalmia ENT: no sore throat, no nodules palpated in neck, no dysphagia, no odynophagia, no hoarseness Cardiovascular: no CP/no SOB/no palpitations/no leg swelling Respiratory: no cough/no SOB/no wheezing Gastrointestinal: no N/no V/no D/no C/no acid  reflux Musculoskeletal: no muscle aches/no joint aches Skin: no rashes, no hair loss Neurological: no tremors/+ numbness/+ tingling/no dizziness  I reviewed pt's medications, allergies, PMH, social hx, family hx, and changes were documented in the history of present illness. Otherwise, unchanged from my initial visit note.  Past Medical History:  Diagnosis Date  .  Abdominal pain 12/01/2015  . Asthma   . Blood in urine   . Diabetes mellitus   . GERD (gastroesophageal reflux disease)   . Hyperlipidemia    notes only hx of hyperlipidemia  . Loss of weight 12/06/2015  . MR (mental retardation), moderate   . MRSA (methicillin resistant staph aureus) culture positive   . Osteoporosis 08/18/2016  . Routine general medical examination at a health care facility 10/15/2012    Past Surgical History:  Procedure Laterality Date  . HIP SURGERY     Left  . INTRAMEDULLARY (IM) NAIL INTERTROCHANTERIC Left 04/10/2014  . INTRAMEDULLARY (IM) NAIL INTERTROCHANTERIC Left 04/10/2014   Procedure: INTRAMEDULLARY (IM) NAIL INTERTROCHANTRIC LEFT HIP;  Surgeon: Renette Butters, MD;  Location: [facility];  Service: Orthopedics;  Laterality: Left;    Social History   Socioeconomic History  . Marital status: Single    Spouse name: Not on file  . Number of children: Not on file  . Years of education: Not on file  . Highest education level: Not on file  Occupational History  . Not on file  Social Needs  . Financial resource strain: Not on file  . Food insecurity    Worry: Not on file    Inability: Not on file  . Transportation needs    Medical: Not on file    Non-medical: Not on file  Tobacco Use  . Smoking status: Never Smoker  . Smokeless tobacco: Never Used  Substance and Sexual Activity  . Alcohol use: No  . Drug use: No  . Sexual activity: Never  Lifestyle  . Physical activity    Days per week: Not on file    Minutes per session: Not on file  . Stress: Not on file  Relationships  . Social  Herbalist on phone: Not on file    Gets together: Not on file    Attends religious service: Not on file    Active member of club or organization: Not on file    Attends meetings of clubs or organizations: Not on file    Relationship status: Not on file  . Intimate partner violence    Fear of current or ex partner: Not on file    Emotionally abused: Not on file    Physically abused: Not on file    Forced sexual activity: Not on file  Other Topics Concern  . Not on file  Social History Narrative  . Not on file    Current Outpatient Medications on File Prior to Visit  Medication Sig Dispense Refill  . ACCU-CHEK AVIVA PLUS test strip Use to check blood sugar 2 times a day. 200 each 12  . ACCU-CHEK FASTCLIX LANCETS MISC Check blood sugars 2 times daily. 200 each 12  . acetaminophen (TYLENOL) 500 MG tablet Take 500 mg by mouth every 4 (four) hours as needed (pain; fever >100).     Marland Kitchen albuterol (PROVENTIL HFA;VENTOLIN HFA) 108 (90 BASE) MCG/ACT inhaler Inhale 2 puffs into the lungs 4 (four) times daily as needed for wheezing or shortness of breath. Shortness of breath/wheezing    . alendronate (FOSAMAX) 70 MG tablet Take 1 tablet (70 mg total) by mouth every 7 (seven) days. Take with a full glass of water on an empty stomach. 15 tablet 3  . B-D UF III MINI PEN NEEDLES 31G X 5 MM MISC USE AS DIRECTED 100 each 11  . Calcium Carbonate (CALCIUM 600 PO) Take by mouth 2 (two) times daily.    Marland Kitchen  Cholecalciferol (VITAMIN D) 2000 units CAPS Take by mouth.    . diphenhydrAMINE (BENADRYL) 25 MG tablet Take 25-50 mg by mouth every 6 (six) hours as needed (allergic reaction). Take 2 tablets (50 mg) for ingestion of shellfish and seek emergency help.    . docusate sodium (COLACE) 100 MG capsule Take 1 capsule (100 mg total) by mouth 2 (two) times daily. Continue this while taking narcotics to help with bowel movements (Patient taking differently: Take 100 mg by mouth 2 (two) times daily. Continue  this while taking narcotics to help with bowel movements  PRN) 30 capsule 1  . fluticasone (FLONASE) 50 MCG/ACT nasal spray Place 1 spray into both nostrils daily as needed for allergies or rhinitis (PRN).     Marland Kitchen glipiZIDE (GLUCOTROL) 5 MG tablet Take 1 tablet (5 mg total) by mouth daily before supper. 20 tablet 5  . guaiFENesin-dextromethorphan (ROBITUSSIN DM) 100-10 MG/5ML syrup Take 15 mLs by mouth every 4 (four) hours as needed for cough (cold symptoms). Reported on 08/17/2015    . ibuprofen (ADVIL,MOTRIN) 200 MG tablet Take 200 mg by mouth every 4 (four) hours as needed.    . Insulin Pen Needle (CAREFINE PEN NEEDLES) 32G X 4 MM MISC Use 1x a day 100 each 3  . LANTUS SOLOSTAR 100 UNIT/ML Solostar Pen INJECT 11 UNITS INTO THE SKIN DAILY AT 10 PM 15 mL 4  . loperamide (IMODIUM A-D) 2 MG tablet Take 2 mg by mouth as needed for diarrhea or loose stools. Take 2 caplets  or 4 tsp (20 mls) liquid by mouth after first loose stool and 1 caplet or 1 tsp (5 mls) liquid by mouth for each subsequent stool as needed; not to exceed 8 caplets or 50 mls in 24 hours    . loratadine (CLARITIN) 10 MG tablet Take 1 tablet (10 mg total) by mouth daily. 30 tablet 11  . losartan (COZAAR) 25 MG tablet TAKE 1 TABLET BY MOUTH ONCE DAILY. 31 tablet 6  . metFORMIN (GLUCOPHAGE) 1000 MG tablet TAKE 1 TABLET BY MOUTH TWICE DAILY WITH A MEAL. 180 tablet 3  . montelukast (SINGULAIR) 10 MG tablet TAKE 1 TABLET BY MOUTH ONCE DAILY. PRN 31 tablet 6  . ranitidine (ZANTAC) 300 MG tablet Take 1 tablet (300 mg total) by mouth at bedtime. 30 tablet 5  . saxagliptin HCl (ONGLYZA) 5 MG TABS tablet Take 1 tablet (5 mg total) by mouth daily. 90 tablet 3   No current facility-administered medications on file prior to visit.     Allergies  Allergen Reactions  . Shellfish Allergy Anaphylaxis  . Iohexol     IVP Dye    Family History  Problem Relation Age of Onset  . Alcohol abuse Father   . Cancer Maternal Grandmother   .  Hyperlipidemia Maternal Grandmother   . Heart disease Maternal Grandmother   . Hypertension Maternal Grandmother   . Heart disease Maternal Grandfather   . Hypertension Maternal Grandfather   . Hyperlipidemia Maternal Grandfather   . Hyperlipidemia Paternal Grandmother   . Heart disease Paternal Grandmother   . Hypertension Paternal Grandmother   . Kidney disease Paternal Grandmother   . Diabetes Paternal Grandmother   . Heart disease Paternal Grandfather   . Hypertension Paternal Grandfather   . Hyperlipidemia Paternal Grandfather   . Alcohol abuse Mother    Pt has FH of DM in father and Paternal GPs.  PE: There were no vitals taken for this visit. Wt Readings from Last 3 Encounters:  07/28/18 138 lb (62.6 kg)  03/26/18 138 lb (62.6 kg)  11/18/17 145 lb 6.4 oz (66 kg)   Constitutional:  in NAD  The physical exam was not performed (virtual visit).  ASSESSMENT: 1. DM1.5, insulin-dependent, uncontrolled, without long term complications  We investigated him for type I DM and GAD Abs were positive >> Type 1.5 DM Component     Latest Ref Rng & Units 10/08/2016  Glucose     65 - 99 mg/dL 73  Pancreatic Islet Cell Antibody     <5 JDF Units <5  Glutamic Acid Decarb Ab     <5 IU/mL 14 (H)  C-Peptide     0.80 - 3.85 ng/mL 1.29   2. Low BMD for age  7.  Numbness and tingling in feet  PLAN:  1. Patient with history of controlled diabetes type 1.5, worse at last visit, due to hyperglycemia spikes in his blood sugars when he was eating out.  Since last visit, due to the coronavirus pandemic, he was not able to eat out and his sugars are better.  He is now out of the group home and living with his aunt's sister.  The visit today was conducted in the presence of the aunt, who offers most of the history as patient is mostly nonverbal. -He continues on oral medication and also low-dose long-acting insulin.  We increased the dose at last visit.  He feels that this helps.  No low blood  sugars.  Sugars in the morning remain slightly above target, and I advised him to try to take the entire metformin dose with dinner.  He does not have any side effects from metformin.  We will continue with the glipizide before larger meal. - I suggested to:  Patient Instructions  Please continue: - Glipizide 5 mg  30 min before a large dinner  - Onglyza 5 mg daily in am - Lantus 11 units at bedtime  Please move the entire Metformin dose (2000 mg) with dinner.  Please continue Fosamax 70 mg weekly.  Please return in 4 months with your sugar log.   - advised to check sugars at different times of the day - 1x a day, rotating check times - advised for yearly eye exams >> he is not UTD - He will need annual labs which will be printed for patients and mailed to his aunt's house.  We will also add an HbA1c and a vitamin D - return to clinic in 4 months      2. Low BMD for age - Patient had a hip fracture while rollerskating 2015.  At that time, DXA scan showed mild low bone mineral density for age.  We started calcium and vitamin D 2000 units daily and he continues on these now.  Vitamin D was normal at last check.  We repeated a DXA scan in 2018 and this showed worsening Z-scores.  At that time, we started Fosamax 70 mg weekly.  No side effects from the medication.  At last visit, he was due for another bone density scan, which I ordered, but he did not have this yet.  We discussed that if the Z-scores are not better, we may need to start parenteral therapy with either Reclast or Prolia. -Investigation for secondary causes for osteoporosis was negative: No vitamin D deficiency, no kidney disease, thyroid disease, hypophosphatasia, anemia, celiac disease, multiple myeloma, hyperparathyroidism, 24 hydroxylase deficiency, calcium malabsorption. -Advised the patient to schedule his DXA scan as soon as safe (now coronavirus  pandemic) -We will also check a CMP and a vitamin D level locally (lab orders  will be sent to him)  3.  Numbness and tingling in feet -Possibly related to diabetes -B12 level was normal  Aunt: [phone] -needs to be called with lab results  Address: [address] [place], [place], [address]  Philemon Kingdom, MD PhD [facility]

## 2019-01-19 NOTE — Patient Instructions (Addendum)
Please continue: - Glipizide 5 mg  30 min before a large dinner  - Onglyza 5 mg daily in am - Lantus 11 units at bedtime  Please move the entire Metformin dose (2000 mg) with dinner.  Please continue Fosamax 70 mg weekly.  Please return in 4 months with your sugar log.

## 2019-01-20 ENCOUNTER — Telehealth: Payer: Self-pay

## 2019-01-20 NOTE — Telephone Encounter (Signed)
Lab orders mailed

## 2019-03-12 DIAGNOSIS — M8580 Other specified disorders of bone density and structure, unspecified site: Secondary | ICD-10-CM | POA: Diagnosis not present

## 2019-03-12 DIAGNOSIS — Z23 Encounter for immunization: Secondary | ICD-10-CM | POA: Diagnosis not present

## 2019-03-12 DIAGNOSIS — E139 Other specified diabetes mellitus without complications: Secondary | ICD-10-CM | POA: Diagnosis not present

## 2019-03-12 LAB — HEMOGLOBIN A1C: Hemoglobin A1C: 6.4

## 2019-03-15 ENCOUNTER — Encounter: Payer: Self-pay | Admitting: Internal Medicine

## 2019-03-15 NOTE — Progress Notes (Signed)
Received labs from [facility] in [place]: 03/12/2019:  CMP: normal with the exception of glucose of 106, bilirubin 1.29 (0.2-1), ALT <10 (6-55).  His BUN/creatinine was 8/0.9, GFR >60. Lipid panel: 148/84/53/89 HbA1c 6.4% Vitamin D 59.1 (13-47.8) - ?, calcium 9.4 (8.4-10.2) I would repeat his vitamin D at next visit, but for now I would suggest to continue current regimen.

## 2019-03-16 DIAGNOSIS — E139 Other specified diabetes mellitus without complications: Secondary | ICD-10-CM | POA: Diagnosis not present

## 2019-03-23 ENCOUNTER — Encounter: Payer: Self-pay | Admitting: Internal Medicine

## 2019-03-30 ENCOUNTER — Encounter: Payer: Self-pay | Admitting: Internal Medicine

## 2019-03-30 NOTE — Progress Notes (Signed)
Repeat labs from PCP, from 03/16/2019:  Microalbumin to creatinine ratio: 7

## 2019-04-05 ENCOUNTER — Other Ambulatory Visit: Payer: Self-pay | Admitting: Family Medicine

## 2019-05-04 ENCOUNTER — Other Ambulatory Visit: Payer: Self-pay | Admitting: Family Medicine

## 2019-05-12 ENCOUNTER — Telehealth: Payer: Self-pay

## 2019-05-12 NOTE — Telephone Encounter (Signed)
Forms for DM supplies filled out, signed by Dr. Gherghe and faxed to [facility] with confirmation.  

## 2019-05-31 ENCOUNTER — Telehealth: Payer: Self-pay

## 2019-05-31 NOTE — Telephone Encounter (Signed)
Forms for DM supplies filled out, signed by Dr. Gherghe and faxed to [facility] with confirmation.  

## 2019-06-07 ENCOUNTER — Other Ambulatory Visit: Payer: Self-pay | Admitting: Family Medicine

## 2019-06-17 ENCOUNTER — Encounter: Payer: Self-pay | Admitting: Internal Medicine

## 2019-06-17 MED ORDER — SITAGLIPTIN PHOSPHATE 100 MG PO TABS: 100.0000 mg | ORAL_TABLET | Freq: Every day | ORAL | 3 refills | Status: DC

## 2019-06-22 ENCOUNTER — Ambulatory Visit: Payer: Medicare Other | Admitting: Family Medicine

## 2019-08-03 ENCOUNTER — Other Ambulatory Visit: Payer: Self-pay | Admitting: Family Medicine

## 2019-08-11 ENCOUNTER — Encounter: Payer: Self-pay | Admitting: Family Medicine

## 2019-08-26 ENCOUNTER — Ambulatory Visit (INDEPENDENT_AMBULATORY_CARE_PROVIDER_SITE_OTHER): Payer: Medicare Other | Admitting: Family Medicine

## 2019-08-26 ENCOUNTER — Ambulatory Visit (HOSPITAL_BASED_OUTPATIENT_CLINIC_OR_DEPARTMENT_OTHER)
Admission: RE | Admit: 2019-08-26 | Discharge: 2019-08-26 | Disposition: A | Discharge status: Home / Self Care | Payer: Medicare Other | Source: Ambulatory Visit | Attending: Family Medicine | Admitting: Family Medicine

## 2019-08-26 ENCOUNTER — Other Ambulatory Visit: Payer: Self-pay

## 2019-08-26 ENCOUNTER — Encounter: Payer: Self-pay | Admitting: Family Medicine

## 2019-08-26 VITALS — BP 132/94 | HR 76 | Temp 97.7°F | Resp 16 | Ht 71.0 in | Wt 145.0 lb

## 2019-08-26 DIAGNOSIS — M25562 Pain in left knee: Secondary | ICD-10-CM | POA: Diagnosis not present

## 2019-08-26 DIAGNOSIS — G8929 Other chronic pain: Secondary | ICD-10-CM

## 2019-08-26 DIAGNOSIS — Z794 Long term (current) use of insulin: Secondary | ICD-10-CM

## 2019-08-26 DIAGNOSIS — B356 Tinea cruris: Secondary | ICD-10-CM | POA: Diagnosis not present

## 2019-08-26 DIAGNOSIS — J45909 Unspecified asthma, uncomplicated: Secondary | ICD-10-CM

## 2019-08-26 DIAGNOSIS — Z0000 Encounter for general adult medical examination without abnormal findings: Secondary | ICD-10-CM

## 2019-08-26 DIAGNOSIS — I10 Essential (primary) hypertension: Secondary | ICD-10-CM | POA: Diagnosis not present

## 2019-08-26 DIAGNOSIS — E1165 Type 2 diabetes mellitus with hyperglycemia: Secondary | ICD-10-CM

## 2019-08-26 NOTE — Progress Notes (Signed)
Subjective:    Patient ID: Adam Fitzpatrick, male    DOB: [birth year], 39 y.o.   MRN: [id number]  Chief Complaint  Patient presents with  . Annual Exam  . Knee Pain    Complains of left knee pain    HPI Patient is in today for annual preventative exam. He is accompanied by his Aunt. For the duration of the pandemic he has moved out of his group home and is back with family. He hs been doing very well. No recent febrile illness or hospitalizations. He is having some pain in his left knee pain. He was kneeling on it and he noted pain and it has not resolved. Denies CP/palp/SOB/HA/congestion/fevers/GI or GU c/o. Taking meds as prescribed  Past Medical History:  Diagnosis Date  . Abdominal pain 12/01/2015  . Asthma   . Blood in urine   . Diabetes mellitus   . GERD (gastroesophageal reflux disease)   . Hyperlipidemia    notes only hx of hyperlipidemia  . Loss of weight 12/06/2015  . MR (mental retardation), moderate   . MRSA (methicillin resistant staph aureus) culture positive   . Osteoporosis 08/18/2016  . Routine general medical examination at a health care facility 10/15/2012    Past Surgical History:  Procedure Laterality Date  . HIP SURGERY     Left  . INTRAMEDULLARY (IM) NAIL INTERTROCHANTERIC Left 04/10/2014  . INTRAMEDULLARY (IM) NAIL INTERTROCHANTERIC Left 04/10/2014   Procedure: INTRAMEDULLARY (IM) NAIL INTERTROCHANTRIC LEFT HIP;  Surgeon: Renette Butters, MD;  Location: [facility];  Service: Orthopedics;  Laterality: Left;    Family History  Problem Relation Age of Onset  . Alcohol abuse Father   . Cancer Maternal Grandmother   . Hyperlipidemia Maternal Grandmother   . Heart disease Maternal Grandmother   . Hypertension Maternal Grandmother   . Heart disease Maternal Grandfather   . Hypertension Maternal Grandfather   . Hyperlipidemia Maternal Grandfather   . Hyperlipidemia Paternal Grandmother   . Heart disease Paternal Grandmother   . Hypertension  Paternal Grandmother   . Kidney disease Paternal Grandmother   . Diabetes Paternal Grandmother   . Heart disease Paternal Grandfather   . Hypertension Paternal Grandfather   . Hyperlipidemia Paternal Grandfather   . Alcohol abuse Mother     Social History   Socioeconomic History  . Marital status: Single    Spouse name: Not on file  . Number of children: Not on file  . Years of education: Not on file  . Highest education level: Not on file  Occupational History  . Not on file  Tobacco Use  . Smoking status: Never Smoker  . Smokeless tobacco: Never Used  Substance and Sexual Activity  . Alcohol use: No  . Drug use: No  . Sexual activity: Never  Other Topics Concern  . Not on file  Social History Narrative  . Not on file   Social Determinants of Health   Financial Resource Strain:   . Difficulty of Paying Living Expenses:   Food Insecurity:   . Worried About Charity fundraiser in the Last Year:   . Arboriculturist in the Last Year:   Transportation Needs:   . Film/video editor (Medical):   Marland Kitchen Lack of Transportation (Non-Medical):   Physical Activity:   . Days of Exercise per Week:   . Minutes of Exercise per Session:   Stress:   . Feeling of Stress :  Social Connections:   . Frequency of Communication with Friends and Family:   . Frequency of Social Gatherings with Friends and Family:   . Attends Religious Services:   . Active Member of Clubs or Organizations:   . Attends Archivist Meetings:   Marland Kitchen Marital Status:   Intimate Partner Violence:   . Fear of Current or Ex-Partner:   . Emotionally Abused:   Marland Kitchen Physically Abused:   . Sexually Abused:     Outpatient Medications Prior to Visit  Medication Sig Dispense Refill  . ACCU-CHEK AVIVA PLUS test strip Use to check blood sugar 2 times a day. 200 each 12  . ACCU-CHEK FASTCLIX LANCETS MISC Check blood sugars 2 times daily. 200 each 12  . acetaminophen (TYLENOL) 500 MG tablet Take 500 mg by mouth  every 4 (four) hours as needed (pain; fever >100).     Marland Kitchen albuterol (PROVENTIL HFA;VENTOLIN HFA) 108 (90 BASE) MCG/ACT inhaler Inhale 2 puffs into the lungs 4 (four) times daily as needed for wheezing or shortness of breath. Shortness of breath/wheezing    . B-D UF III MINI PEN NEEDLES 31G X 5 MM MISC USE AS DIRECTED 100 each 11  . Calcium Carbonate (CALCIUM 600 PO) Take by mouth 2 (two) times daily.    . Cholecalciferol (VITAMIN D) 2000 units CAPS Take by mouth.    . diphenhydrAMINE (BENADRYL) 25 MG tablet Take 25-50 mg by mouth every 6 (six) hours as needed (allergic reaction). Take 2 tablets (50 mg) for ingestion of shellfish and seek emergency help.    . docusate sodium (COLACE) 100 MG capsule Take 1 capsule (100 mg total) by mouth 2 (two) times daily. Continue this while taking narcotics to help with bowel movements (Patient taking differently: Take 100 mg by mouth 2 (two) times daily. Continue this while taking narcotics to help with bowel movements  PRN) 30 capsule 1  . famotidine (PEPCID) 40 MG tablet TAKE 1 TABLET BY MOUTH AT BEDTIME. 31 tablet 0  . fluticasone (FLONASE) 50 MCG/ACT nasal spray Place 1 spray into both nostrils daily as needed for allergies or rhinitis (PRN).     Marland Kitchen glipiZIDE (GLUCOTROL) 5 MG tablet Take 1 tablet (5 mg total) by mouth daily before supper. 20 tablet 5  . guaiFENesin-dextromethorphan (ROBITUSSIN DM) 100-10 MG/5ML syrup Take 15 mLs by mouth every 4 (four) hours as needed for cough (cold symptoms). Reported on 08/17/2015    . ibuprofen (ADVIL,MOTRIN) 200 MG tablet Take 200 mg by mouth every 4 (four) hours as needed.    . Insulin Pen Needle (CAREFINE PEN NEEDLES) 32G X 4 MM MISC Use 1x a day 100 each 3  . LANTUS SOLOSTAR 100 UNIT/ML Solostar Pen INJECT 11 UNITS INTO THE SKIN DAILY AT 10 PM 15 mL 4  . loperamide (IMODIUM A-D) 2 MG tablet Take 2 mg by mouth as needed for diarrhea or loose stools. Take 2 caplets  or 4 tsp (20 mls) liquid by mouth after first loose stool  and 1 caplet or 1 tsp (5 mls) liquid by mouth for each subsequent stool as needed; not to exceed 8 caplets or 50 mls in 24 hours    . loratadine (CLARITIN) 10 MG tablet Take 1 tablet (10 mg total) by mouth daily. 30 tablet 11  . losartan (COZAAR) 25 MG tablet TAKE 1 TABLET BY MOUTH ONCE DAILY. 31 tablet PRN  . metFORMIN (GLUCOPHAGE) 1000 MG tablet TAKE 1 TABLET BY MOUTH TWICE DAILY WITH A MEAL. 62 tablet  PRN  . montelukast (SINGULAIR) 10 MG tablet TAKE 1 TABLET BY MOUTH ONCE DAILY. 31 tablet PRN  . sitaGLIPtin (JANUVIA) 100 MG tablet Take 1 tablet (100 mg total) by mouth daily before breakfast. 90 tablet 3  . alendronate (FOSAMAX) 70 MG tablet Take 1 tablet (70 mg total) by mouth every 7 (seven) days. Take with a full glass of water on an empty stomach. 15 tablet 3  . ranitidine (ZANTAC) 300 MG tablet Take 1 tablet (300 mg total) by mouth at bedtime. 30 tablet 5   No facility-administered medications prior to visit.    Allergies  Allergen Reactions  . Shellfish Allergy Anaphylaxis  . Iohexol     IVP Dye    Review of Systems  Constitutional: Negative for chills, fever and malaise/fatigue.  HENT: Negative for congestion and hearing loss.   Eyes: Negative for discharge.  Respiratory: Negative for cough, sputum production and shortness of breath.   Cardiovascular: Negative for chest pain, palpitations and leg swelling.  Gastrointestinal: Negative for abdominal pain, blood in stool, constipation, diarrhea, heartburn, nausea and vomiting.  Genitourinary: Negative for dysuria, frequency, hematuria and urgency.  Musculoskeletal: Positive for joint pain. Negative for back pain, falls and myalgias.  Skin: Negative for rash.  Neurological: Negative for dizziness, sensory change, loss of consciousness, weakness and headaches.  Endo/Heme/Allergies: Negative for environmental allergies. Does not bruise/bleed easily.  Psychiatric/Behavioral: Negative for depression and suicidal ideas. The patient is  not nervous/anxious and does not have insomnia.        Objective:    Physical Exam Vitals and nursing note reviewed.  Constitutional:      General: He is not in acute distress.    Appearance: Normal appearance. He is well-developed. He is not ill-appearing.  HENT:     Head: Normocephalic and atraumatic.     Right Ear: Tympanic membrane, ear canal and external ear normal. There is no impacted cerumen.     Left Ear: Tympanic membrane, ear canal and external ear normal. There is no impacted cerumen.     Nose: Nose normal. No congestion.     Mouth/Throat:     Pharynx: No posterior oropharyngeal erythema.  Eyes:     General:        Right eye: No discharge.        Left eye: No discharge.     Extraocular Movements: Extraocular movements intact.     Conjunctiva/sclera: Conjunctivae normal.     Pupils: Pupils are equal, round, and reactive to light.  Cardiovascular:     Rate and Rhythm: Normal rate and regular rhythm.     Heart sounds: No murmur.  Pulmonary:     Effort: Pulmonary effort is normal. No respiratory distress.     Breath sounds: Normal breath sounds. No wheezing.  Abdominal:     General: Bowel sounds are normal. There is no distension.     Palpations: Abdomen is soft. There is no mass.     Tenderness: There is no abdominal tenderness. There is no guarding or rebound.  Musculoskeletal:        General: No tenderness.     Cervical back: Normal range of motion and neck supple.  Skin:    General: Skin is warm and dry.  Neurological:     Mental Status: He is alert. He is disoriented.  Psychiatric:        Mood and Affect: Mood normal.        Behavior: Behavior normal.  Thought Content: Thought content normal.        Judgment: Judgment normal.     BP (!) 132/94 (BP Location: Right Arm, Patient Position: Sitting, Cuff Size: Small)   Pulse 76   Temp 97.7 F (36.5 C) (Temporal)   Resp 16   Ht 5\' 11"  (1.803 m)   Wt 145 lb (65.8 kg)   SpO2 100%   BMI 20.22 kg/m    Wt Readings from Last 3 Encounters:  08/26/19 145 lb (65.8 kg)  07/28/18 138 lb (62.6 kg)  03/26/18 138 lb (62.6 kg)    Diabetic Foot Exam - Simple   No data filed     Lab Results  Component Value Date   WBC 5.4 08/26/2019   HGB 14.1 08/26/2019   HCT 41.3 08/26/2019   PLT 267.0 08/26/2019   GLUCOSE 81 08/26/2019   CHOL 145 08/26/2019   TRIG 59.0 08/26/2019   HDL 53.10 08/26/2019   LDLCALC 80 08/26/2019   ALT 9 08/26/2019   AST 10 08/26/2019   NA 140 08/26/2019   K 4.6 08/26/2019   CL 103 08/26/2019   CREATININE 0.75 08/26/2019   BUN 8 08/26/2019   CO2 28 08/26/2019   TSH 1.13 08/26/2019   HGBA1C 6.4 08/26/2019   MICROALBUR 2.3 (H) 10/23/2011    Lab Results  Component Value Date   TSH 1.13 08/26/2019   Lab Results  Component Value Date   WBC 5.4 08/26/2019   HGB 14.1 08/26/2019   HCT 41.3 08/26/2019   MCV 89.3 08/26/2019   PLT 267.0 08/26/2019   Lab Results  Component Value Date   NA 140 08/26/2019   K 4.6 08/26/2019   CO2 28 08/26/2019   GLUCOSE 81 08/26/2019   BUN 8 08/26/2019   CREATININE 0.75 08/26/2019   BILITOT 1.3 (H) 08/26/2019   ALKPHOS 37 (L) 08/26/2019   AST 10 08/26/2019   ALT 9 08/26/2019   PROT 6.9 08/26/2019   ALBUMIN 4.5 08/26/2019   CALCIUM 9.7 08/26/2019   ANIONGAP 13 04/11/2014   GFR 116.73 08/26/2019   Lab Results  Component Value Date   CHOL 145 08/26/2019   Lab Results  Component Value Date   HDL 53.10 08/26/2019   Lab Results  Component Value Date   LDLCALC 80 08/26/2019   Lab Results  Component Value Date   TRIG 59.0 08/26/2019   Lab Results  Component Value Date   CHOLHDL 3 08/26/2019   Lab Results  Component Value Date   HGBA1C 6.4 08/26/2019       Assessment & Plan:   Problem List Items Addressed This Visit    Type 2 diabetes mellitus with hyperglycemia, with long-term current use of insulin (HCC) - Primary (Chronic)    hgba1c acceptable, minimize simple carbs. Increase exercise as tolerated.  Continue current meds. He continues to follow with endocrinology      Relevant Orders   Hemoglobin A1c (Completed)   Lipid panel (Completed)   Asthma with allergic rhinitis    No recent exacerbation      Routine general medical examination at a health care facility    Patient encouraged to maintain heart healthy diet, regular exercise, adequate sleep. Consider daily probiotics. Take medications as prescribed. Labs are ordered and reviewed. He is currently not living at his group home. He has moved back in with his family during the pandemic. He may move back into the group home in the future but they are not sure at this time.  Hypertension   Relevant Orders   CBC (Completed)   Comprehensive metabolic panel (Completed)   Lipid panel (Completed)   TSH (Completed)   Tinea cruris   Chronic pain of left knee    Try Tylenol and topical lidocaine prn. If pain persists consider referral to ortho or sports med.      Relevant Orders   DG Knee Complete 4 Views Left (Completed)      I have discontinued Marchia Meiers. Henson's ranitidine. I am also having him maintain his albuterol, guaiFENesin-dextromethorphan, acetaminophen, loperamide, fluticasone, docusate sodium, diphenhydrAMINE, ibuprofen, Calcium Carbonate (CALCIUM 600 PO), Vitamin D, loratadine, Insulin Pen Needle, glipiZIDE, Accu-Chek Aviva Plus, Accu-Chek FastClix Lancets, Lantus SoloStar, B-D UF III MINI PEN NEEDLES, sitaGLIPtin, losartan, metFORMIN, montelukast, and famotidine.  No orders of the defined types were placed in this encounter.    Penni Homans, MD

## 2019-08-26 NOTE — Patient Instructions (Addendum)
Try Aspercreme to the left knee twice a day and call in a couple weeks for referral Lamisil for ringworm, can call for Diflucan  Omron Blood Pressure cuff, upper arm, want BP 100-140/60-90 Pulse oximeter, want oxygen in 90s  Weekly vitals  Take Multivitamin with minerals, selenium Vitamin D 1000-2000 IU daily Probiotic with lactobacillus and bifidophilus Asprin EC 81 mg daily Fish oil or krill oil cap daily Melatonin 2-5 mg at bedtime  [email or web address] Peripheral Neuropathy Peripheral neuropathy is a type of nerve damage. It affects nerves that carry signals between the spinal cord and the arms, legs, and the rest of the body (peripheral nerves). It does not affect nerves in the spinal cord or brain. In peripheral neuropathy, one nerve or a group of nerves may be damaged. Peripheral neuropathy is a broad category that includes many specific nerve disorders, like diabetic neuropathy, hereditary neuropathy, and carpal tunnel syndrome. What are the causes? This condition may be caused by:  Diabetes. This is the most common cause of peripheral neuropathy.  Nerve injury.  Pressure or stress on a nerve that lasts a long time.  Lack (deficiency) of B vitamins. This can result from alcoholism, poor diet, or a restricted diet.  Infections.  Autoimmune diseases, such as rheumatoid arthritis and systemic lupus erythematosus.  Nerve diseases that are passed from parent to child (inherited).  Some medicines, such as cancer medicines (chemotherapy).  Poisonous (toxic) substances, such as lead and mercury.  Too little blood flowing to the legs.  Kidney disease.  Thyroid disease. In some cases, the cause of this condition is not known. What are the signs or symptoms? Symptoms of this condition depend on which of your nerves is damaged. Common symptoms include:  Loss of feeling (numbness) in the feet, hands, or both.  Tingling in the feet, hands,  or both.  Burning pain.  Very sensitive skin.  Weakness.  Not being able to move a part of the body (paralysis).  Muscle twitching.  Clumsiness or poor coordination.  Loss of balance.  Not being able to control your bladder.  Feeling dizzy.  Sexual problems. How is this diagnosed? Diagnosing and finding the cause of peripheral neuropathy can be difficult. Your health care provider will take your medical history and do a physical exam. A neurological exam will also be done. This involves checking things that are affected by your brain, spinal cord, and nerves (nervous system). For example, your health care provider will check your reflexes, how you move, and what you can feel. You may have other tests, such as:  Blood tests.  Electromyogram (EMG) and nerve conduction tests. These tests check nerve function and how well the nerves are controlling the muscles.  Imaging tests, such as CT scans or MRI to rule out other causes of your symptoms.  Removing a small piece of nerve to be examined in a lab (nerve biopsy). This is rare.  Removing and examining a small amount of the fluid that surrounds the brain and spinal cord (lumbar puncture). This is rare. How is this treated? Treatment for this condition may involve:  Treating the underlying cause of the neuropathy, such as diabetes, kidney disease, or vitamin deficiencies.  Stopping medicines that can cause neuropathy, such as chemotherapy.  Medicine to relieve pain. Medicines may include: ? Prescription or over-the-counter pain medicine. ? Antiseizure medicine. ? Antidepressants. ? Pain-relieving patches that are applied to painful areas of skin.  Surgery to relieve pressure on a nerve or to destroy a nerve  that is causing pain.  Physical therapy to help improve movement and balance.  Devices to help you move around (assistive devices). Follow these instructions at home: Medicines  Take over-the-counter and  prescription medicines only as told by your health care provider. Do not take any other medicines without first asking your health care provider.  Do not drive or use heavy machinery while taking prescription pain medicine. Lifestyle   Do not use any products that contain nicotine or tobacco, such as cigarettes and e-cigarettes. Smoking keeps blood from reaching damaged nerves. If you need help quitting, ask your health care provider.  Avoid or limit alcohol. Too much alcohol can cause a vitamin B deficiency, and vitamin B is needed for healthy nerves.  Eat a healthy diet. This includes: ? Eating foods that are high in fiber, such as fresh fruits and vegetables, whole grains, and beans. ? Limiting foods that are high in fat and processed sugars, such as fried or sweet foods. General instructions   If you have diabetes, work closely with your health care provider to keep your blood sugar under control.  If you have numbness in your feet: ? Check every day for signs of injury or infection. Watch for redness, warmth, and swelling. ? Wear padded socks and comfortable shoes. These help protect your feet.  Develop a good support system. Living with peripheral neuropathy can be stressful. Consider talking with a mental health specialist or joining a support group.  Use assistive devices and attend physical therapy as told by your health care provider. This may include using a walker or a cane.  Keep all follow-up visits as told by your health care provider. This is important. Contact a health care provider if:  You have new signs or symptoms of peripheral neuropathy.  You are struggling emotionally from dealing with peripheral neuropathy.  Your pain is not well-controlled. Get help right away if:  You have an injury or infection that is not healing normally.  You develop new weakness in an arm or leg.  You fall frequently. Summary  Peripheral neuropathy is when the nerves in the  arms, or legs are damaged, resulting in numbness, weakness, or pain.  There are many causes of peripheral neuropathy, including diabetes, pinched nerves, vitamin deficiencies, autoimmune disease, and hereditary conditions.  Diagnosing and finding the cause of peripheral neuropathy can be difficult. Your health care provider will take your medical history, do a physical exam, and do tests, including blood tests and nerve function tests.  Treatment involves treating the underlying cause of the neuropathy and taking medicines to help control pain. Physical therapy and assistive devices may also help. This information is not intended to replace advice given to you by your health care provider. Make sure you discuss any questions you have with your health care provider. Document Revised: 05/02/2017 Document Reviewed: 07/29/2016 Elsevier Patient Education  [place] phenomenon is a condition that affects the blood vessels (arteries) that carry blood to your fingers and toes. The arteries that supply blood to your ears, lips, nipples, or the tip of your nose might also be affected. Raynaud phenomenon causes the arteries to become narrow temporarily (spasm). As a result, the flow of blood to the affected areas is temporarily decreased. This usually occurs in response to cold temperatures or stress. During an attack, the skin in the affected areas turns white, then blue, and finally red. You may also feel tingling or numbness in those areas. Attacks  usually last for only a brief period, and then the blood flow to the area returns to normal. In most cases, Raynaud phenomenon does not cause serious health problems. What are the causes? In many cases, the cause of this condition is not known. The condition may occur on its own (primary Raynaud phenomenon) or may be associated with other diseases or factors (secondary Raynaud phenomenon). Possible causes may include:   Diseases or medical conditions that damage the arteries.  Injuries and repetitive actions that hurt the hands or feet.  Being exposed to certain chemicals.  Taking medicines that narrow the arteries.  Other medical conditions, such as lupus, scleroderma, rheumatoid arthritis, thyroid problems, blood disorders, Sjogren syndrome, or atherosclerosis. What increases the risk? The following factors may make you more likely to develop this condition:  Being 28-43 years old.  Being male.  Having a family history of Raynaud phenomenon.  Living in a cold climate.  Smoking. What are the signs or symptoms? Symptoms of this condition usually occur when you are exposed to cold temperatures or when you have emotional stress. The symptoms may last for a few minutes or up to several hours. They usually affect your fingers but may also affect your toes, nipples, lips, ears, or the tip of your nose. Symptoms may include:  Changes in skin color. The skin in the affected areas will turn pale or white. The skin may then change from white to bluish to red as normal blood flow returns to the area.  Numbness, tingling, or pain in the affected areas. In severe cases, symptoms may include:  Skin sores.  Tissues decaying and dying (gangrene). How is this diagnosed? This condition may be diagnosed based on:  Your symptoms and medical history.  A physical exam. During the exam, you may be asked to put your hands in cold water to check for a reaction to cold temperature.  Tests, such as: ? Blood tests to check for other diseases or conditions. ? A test to check the movement of blood through your arteries and veins (vascular ultrasound). ? A test in which the skin at the base of your fingernail is examined under a microscope (nailfold capillaroscopy). How is this treated? Treatment for this condition often involves making lifestyle changes and taking steps to control your exposure to cold  temperatures. For more severe cases, medicine (calcium channel blockers) may be used to improve blood flow. Surgery is sometimes done to block the nerves that control the affected arteries, but this is rare. Follow these instructions at home: Avoiding cold temperatures Take these steps to avoid exposure to cold:  If possible, stay indoors during cold weather.  When you go outside during cold weather, dress in layers and wear mittens, a hat, a scarf, and warm footwear.  Wear mittens or gloves when handling ice or frozen food.  Use holders for glasses or cans containing cold drinks.  Let warm water run for a while before taking a shower or bath.  Warm up the car before driving in cold weather. Lifestyle   If possible, avoid stressful and emotional situations. Try to find ways to manage your stress, such as: ? Exercise. ? Yoga. ? Meditation. ? Biofeedback.  Do not use any products that contain nicotine or tobacco, such as cigarettes and e-cigarettes. If you need help quitting, ask your health care provider.  Avoid secondhand smoke.  Limit your use of caffeine. ? Switch to decaffeinated coffee, tea, and soda. ? Avoid chocolate.  Avoid vibrating tools  and machinery. General instructions  Protect your hands and feet from injuries, cuts, or bruises.  Avoid wearing tight rings or wristbands.  Wear loose fitting socks and comfortable, roomy shoes.  Take over-the-counter and prescription medicines only as told by your health care provider. Contact a health care provider if:  Your discomfort becomes worse despite lifestyle changes.  You develop sores on your fingers or toes that do not heal.  Your fingers or toes turn black.  You have breaks in the skin on your fingers or toes.  You have a fever.  You have pain or swelling in your joints.  You have a rash.  Your symptoms occur on only one side of your body. Summary  Raynaud phenomenon is a condition that affects the  arteries that carry blood to your fingers, toes, ears, lips, nipples, or the tip of your nose.  In many cases, the cause of this condition is not known.  Symptoms of this condition include changes in skin color, and numbness and tingling of the affected area.  Treatment for this condition includes lifestyle changes, reducing exposure to cold temperatures, and using medicines for severe cases of the condition.  Contact your health care provider if your condition worsens despite treatment. This information is not intended to replace advice given to you by your health care provider. Make sure you discuss any questions you have with your health care provider. Document Revised: 05/23/2017 Document Reviewed: 07/01/2016 Elsevier Patient Education  2020 [place] 35-25 Years Old, Male Preventive care refers to lifestyle choices and visits with your health care provider that can promote health and wellness. This includes:  A yearly physical exam. This is also called an annual well check.  Regular dental and eye exams.  Immunizations.  Screening for certain conditions.  Healthy lifestyle choices, such as eating a healthy diet, getting regular exercise, not using drugs or products that contain nicotine and tobacco, and limiting alcohol use. What can I expect for my preventive care visit? Physical exam Your health care provider will check:  Height and weight. These may be used to calculate body mass index (BMI), which is a measurement that tells if you are at a healthy weight.  Heart rate and blood pressure.  Your skin for abnormal spots. Counseling Your health care provider may ask you questions about:  Alcohol, tobacco, and drug use.  Emotional well-being.  Home and relationship well-being.  Sexual activity.  Eating habits.  Work and work Statistician. What immunizations do I need?  Influenza (flu) vaccine  This is recommended every year. Tetanus,  diphtheria, and pertussis (Tdap) vaccine  You may need a Td booster every 10 years. Varicella (chickenpox) vaccine  You may need this vaccine if you have not already been vaccinated. Human papillomavirus (HPV) vaccine  If recommended by your health care provider, you may need three doses over 6 months. Measles, mumps, and rubella (MMR) vaccine  You may need at least one dose of MMR. You may also need a second dose. Meningococcal conjugate (MenACWY) vaccine  One dose is recommended if you are 56-10 years old and a Market researcher living in a residence hall, or if you have one of several medical conditions. You may also need additional booster doses. Pneumococcal conjugate (PCV13) vaccine  You may need this if you have certain conditions and were not previously vaccinated. Pneumococcal polysaccharide (PPSV23) vaccine  You may need one or two doses if you smoke cigarettes or if you have  certain conditions. Hepatitis A vaccine  You may need this if you have certain conditions or if you travel or work in places where you may be exposed to hepatitis A. Hepatitis B vaccine  You may need this if you have certain conditions or if you travel or work in places where you may be exposed to hepatitis B. Haemophilus influenzae type b (Hib) vaccine  You may need this if you have certain risk factors. You may receive vaccines as individual doses or as more than one vaccine together in one shot (combination vaccines). Talk with your health care provider about the risks and benefits of combination vaccines. What tests do I need? Blood tests  Lipid and cholesterol levels. These may be checked every 5 years starting at age 25.  Hepatitis C test.  Hepatitis B test. Screening   Diabetes screening. This is done by checking your blood sugar (glucose) after you have not eaten for a while (fasting).  Sexually transmitted disease (STD) testing. Talk with your health care provider about  your test results, treatment options, and if necessary, the need for more tests. Follow these instructions at home: Eating and drinking   Eat a diet that includes fresh fruits and vegetables, whole grains, lean protein, and low-fat dairy products.  Take vitamin and mineral supplements as recommended by your health care provider.  Do not drink alcohol if your health care provider tells you not to drink.  If you drink alcohol: ? Limit how much you have to 0-2 drinks a day. ? Be aware of how much alcohol is in your drink. In the [place] one drink equals one 12 oz bottle of beer (355 mL), one 5 oz glass of wine (148 mL), or one 1 oz glass of hard liquor (44 mL). Lifestyle  Take daily care of your teeth and gums.  Stay active. Exercise for at least 30 minutes on 5 or more days each week.  Do not use any products that contain nicotine or tobacco, such as cigarettes, e-cigarettes, and chewing tobacco. If you need help quitting, ask your health care provider.  If you are sexually active, practice safe sex. Use a condom or other form of protection to prevent STIs (sexually transmitted infections). What's next?  Go to your health care provider once a year for a well check visit.  Ask your health care provider how often you should have your eyes and teeth checked.  Stay up to date on all vaccines. This information is not intended to replace advice given to you by your health care provider. Make sure you discuss any questions you have with your health care provider. Document Revised: 05/14/2018 Document Reviewed: 05/14/2018 Elsevier Patient Education  2020 [employer].

## 2019-08-27 ENCOUNTER — Other Ambulatory Visit: Payer: Self-pay | Admitting: Family Medicine

## 2019-08-27 LAB — COMPREHENSIVE METABOLIC PANEL
ALT: 9 U/L (ref 0–53)
AST: 10 U/L (ref 0–37)
Albumin: 4.5 g/dL (ref 3.5–5.2)
Alkaline Phosphatase: 37 U/L — ABNORMAL LOW (ref 39–117)
BUN: 8 mg/dL (ref 6–23)
CO2: 28 mEq/L (ref 19–32)
Calcium: 9.7 mg/dL (ref 8.4–10.5)
Chloride: 103 mEq/L (ref 96–112)
Creatinine, Ser: 0.75 mg/dL (ref 0.40–1.50)
GFR: 116.73 mL/min (ref >60.00)
Glucose, Bld: 81 mg/dL (ref 70–99)
Potassium: 4.6 mEq/L (ref 3.5–5.1)
Sodium: 140 mEq/L (ref 135–145)
Total Bilirubin: 1.3 mg/dL — ABNORMAL HIGH (ref 0.2–1.2)
Total Protein: 6.9 g/dL (ref 6.0–8.3)

## 2019-08-27 LAB — TSH: TSH: 1.13 u[IU]/mL (ref 0.35–4.50)

## 2019-08-27 LAB — LIPID PANEL
Cholesterol: 145 mg/dL (ref 0–200)
HDL: 53.1 mg/dL (ref >39.00)
LDL Cholesterol: 80 mg/dL (ref 0–99)
NonHDL: 91.69
Total CHOL/HDL Ratio: 3
Triglycerides: 59 mg/dL (ref 0.0–149.0)
VLDL: 11.8 mg/dL (ref 0.0–40.0)

## 2019-08-27 LAB — CBC
HCT: 41.3 % (ref 39.0–52.0)
Hemoglobin: 14.1 g/dL (ref 13.0–17.0)
MCHC: 34.1 g/dL (ref 30.0–36.0)
MCV: 89.3 fl (ref 78.0–100.0)
Platelets: 267 10*3/uL (ref 150.0–400.0)
RBC: 4.62 Mil/uL (ref 4.22–5.81)
RDW: 12.9 % (ref 11.5–15.5)
WBC: 5.4 10*3/uL (ref 4.0–10.5)

## 2019-08-27 LAB — HEMOGLOBIN A1C: Hgb A1c MFr Bld: 6.4 % (ref 4.6–6.5)

## 2019-08-29 DIAGNOSIS — M25562 Pain in left knee: Secondary | ICD-10-CM | POA: Insufficient documentation

## 2019-08-29 DIAGNOSIS — G8929 Other chronic pain: Secondary | ICD-10-CM | POA: Insufficient documentation

## 2019-08-29 NOTE — Assessment & Plan Note (Signed)
No recent exacerbation 

## 2019-08-29 NOTE — Assessment & Plan Note (Signed)
Patient encouraged to maintain heart healthy diet, regular exercise, adequate sleep. Consider daily probiotics. Take medications as prescribed. Labs are ordered and reviewed. He is currently not living at his group home. He has moved back in with his family during the pandemic. He may move back into the group home in the future but they are not sure at this time.

## 2019-08-29 NOTE — Assessment & Plan Note (Signed)
hgba1c acceptable, minimize simple carbs. Increase exercise as tolerated. Continue current meds. He continues to follow with endocrinology

## 2019-08-29 NOTE — Assessment & Plan Note (Addendum)
Try Tylenol and topical lidocaine prn. If pain persists consider referral to ortho or sports med.

## 2019-09-08 ENCOUNTER — Other Ambulatory Visit: Payer: Self-pay | Admitting: Family Medicine

## 2019-09-08 ENCOUNTER — Other Ambulatory Visit: Payer: Self-pay | Admitting: Internal Medicine

## 2019-09-10 ENCOUNTER — Telehealth: Payer: Self-pay | Admitting: Internal Medicine

## 2019-09-10 MED ORDER — GLIPIZIDE 5 MG PO TABS: 5.0000 mg | ORAL_TABLET | Freq: Every day | ORAL | 6 refills | Status: DC

## 2019-09-10 NOTE — Telephone Encounter (Signed)
Have you contacted your pharmacy to initiate the refill request? Pharmacy calling          If no then please ask them to call the pharmacy to start the request but if they insist proceed to next questions  What is the name of the medication/medications? GLIPIZIDE        If it is for test strips or lancets please confirm the brand  Is this for a 90 day? "30 day PRN"  What is the name and location of the pharmacy you would like to use?   [facility] - [place], [place] - [address] Phone:  [phone]  Fax:  [phone]

## 2019-09-10 NOTE — Telephone Encounter (Signed)
RX sent

## 2019-09-19 ENCOUNTER — Other Ambulatory Visit: Payer: Self-pay | Admitting: Internal Medicine

## 2019-11-02 ENCOUNTER — Encounter: Payer: Self-pay | Admitting: Internal Medicine

## 2019-11-02 ENCOUNTER — Other Ambulatory Visit: Payer: Self-pay

## 2019-11-02 ENCOUNTER — Ambulatory Visit (INDEPENDENT_AMBULATORY_CARE_PROVIDER_SITE_OTHER): Payer: Medicare Other | Admitting: Internal Medicine

## 2019-11-02 VITALS — BP 118/70 | HR 96 | Ht 71.0 in | Wt 143.0 lb

## 2019-11-02 DIAGNOSIS — M8580 Other specified disorders of bone density and structure, unspecified site: Secondary | ICD-10-CM

## 2019-11-02 DIAGNOSIS — M8588 Other specified disorders of bone density and structure, other site: Secondary | ICD-10-CM | POA: Diagnosis not present

## 2019-11-02 DIAGNOSIS — M859 Disorder of bone density and structure, unspecified: Secondary | ICD-10-CM

## 2019-11-02 DIAGNOSIS — M810 Age-related osteoporosis without current pathological fracture: Secondary | ICD-10-CM | POA: Diagnosis not present

## 2019-11-02 DIAGNOSIS — E139 Other specified diabetes mellitus without complications: Secondary | ICD-10-CM | POA: Insufficient documentation

## 2019-11-02 LAB — POCT GLYCOSYLATED HEMOGLOBIN (HGB A1C): Hemoglobin A1C: 6.1 % — AB (ref 4.0–5.6)

## 2019-11-02 MED ORDER — ACCU-CHEK FASTCLIX LANCETS MISC: 12 refills | Status: DC

## 2019-11-02 MED ORDER — CAREFINE PEN NEEDLES 32G X 4 MM MISC: 3 refills | Status: DC

## 2019-11-02 MED ORDER — LANTUS SOLOSTAR 100 UNIT/ML ~~LOC~~ SOPN: 11.0000 [IU] | PEN_INJECTOR | Freq: Every day | SUBCUTANEOUS | 4 refills | Status: DC

## 2019-11-02 NOTE — Progress Notes (Addendum)
Patient ID: TOBENNA NEEDS, adult   DOB: [birth year], 39 y.o.   MRN: [id number]   This visit occurred during the SARS-CoV-2 public health emergency.  Safety protocols were in place, including screening questions prior to the visit, additional usage of staff PPE, and extensive cleaning of exam room while observing appropriate contact time as indicated for disinfecting solutions.   HPI: ALEN MATHESON) is a 39 y.o. man, returning for f/u for DM1.5, dx as DM2 in 2011, insulin-dependent, uncontrolled, without long-term complications and low BMD for age.  He is here with his aunt, who offers most of the history, as patient is very quiet, and mostly nonverbal.  Last visit 10 months ago (virtual).  Before last visit, Shanon Brow moved out of his group home and he is living with his mother.  His diabetes improved after this change.  DM1.5:  Reviewed HbA1c levels: Lab Results  Component Value Date   HGBA1C 6.4 08/26/2019   HGBA1C 6.4 03/12/2019   HGBA1C 7.7 (A) 07/28/2018   HGBA1C 6.0 (A) 03/26/2018   HGBA1C 6.7 (A) 11/18/2017   HGBA1C 6.6 07/16/2017   HGBA1C 6.8 (H) 07/15/2017   HGBA1C 7.5 04/15/2017   HGBA1C 7.5 01/28/2017   HGBA1C 8.8 (H) 08/13/2016   He is on: - Metformin 1000 mg 2x a day, with meals >> 2000 mg with dinner >> 1000 mg 2x a day with meals - Glipizide 5 mg 30 minutes before a large dinner - Onglyza 5 mg daily in am >>  Januvia 100 mg daily in a.m. - Lantus 9 >> 11 units at bedtime (increased 07/2018)  Checks his sugars once a day: - am: 67, 73-122, 148 >> 85-150, 218, 265 >> 95-141 (ave 130-140) >> 92-129 - 2h after b'fast: n/c >> 138, 145 >> n/c - before lunch: n/c >> 145 >> n/c - 2h after lunch: n/c >> 128 >> n/c - before dinner: n/c >> 138 >> n/c - 2h after dinner: 78-184, 269, 315, 335 >> 100-277 (ave 140) >> 112-182 - bedtime: n/c - nighttime: n/c Lowest sugar was 67 >> 78 >> 95 >> 92; it is unclear at which level he has hypoglycemia awareness. Highest sugar  was 285 >> 335 >> 277 >> 182.  Glucometer: AccuChek Aviva  Pt's meals are: - Breakfast: oatmeal, eggs, bacon, sausage, pancakes (sugarfree syrup) - Lunch: 2 PB sandwiches on wheat - Dinner: low carb - Snacks: before bedtime He lost a significant amount of weight before starting insulin: 25-30 lbs.  -No CKD, last BUN/creatinine:  Lab Results  Component Value Date   BUN 8 08/26/2019   BUN 9 07/15/2017   CREATININE 0.75 08/26/2019   CREATININE 0.75 07/15/2017  On losartan. - No HL; last set of lipids was normal: Lab Results  Component Value Date   CHOL 145 08/26/2019   HDL 53.10 08/26/2019   LDLCALC 80 08/26/2019   TRIG 59.0 08/26/2019   CHOLHDL 3 08/26/2019  He is not on a statin. - last eye exam was in 2020: No DR reportedly.  Prev. Dr. Nancee Liter in [place]. Now another Dr - will let me know. - + numbness and tingling in his feet.    B12 levels were normal: Lab Results  Component Value Date   VITAMINB12 458 03/26/2018   VITAMINB12 549 11/17/2015    Low BMD for age:  Reviewed and addended history:  Reviewed the reports of available DXA scans: Z-score Lumbar spine (L1-L4) Femoral neck (FN) 33% distal radius UD radius  08/13/2016 -2.6 RFN: -  2.4 LFN: n/a -0.1 n/a  05/04/2014 -2.1 RFN: -2.2 +0.6 +0.1  He denies dizziness/vertigo/orthostasis.  He had a left hip fracture in 2018 while [place] [employer].  Vitamin D level was normal at last check: 03/12/2019: Vitamin D 59.1 (13-47.8) Lab Results  Component Value Date   VD25OH 82.12 03/26/2018   VD25OH 72.84 11/17/2015   Calcitriol level was normal: Component     Latest Ref Rng & Units 10/08/2016  Vitamin D 1, 25 (OH) Total     18 - 72 pg/mL 31  Vitamin D3 1, 25 (OH)     pg/mL reviewed and addended history: 31  Vitamin D2 1, 25 (OH)     pg/mL <8   He continues on calcium 600 mg twice a day and vitamin D 2000 units daily.  Restarting Fosamax 11/2016.  No side effects..  No weightbearing  exercises.  She is not on vitamin A supplements  Pt does have a FH of osteoporosis: MGM.  No hypo or hypercalcemia or hyperparathyroidism.  No history of kidney stones. 03/12/2019: calcium 9.4 (8.4-10.2) Lab Results  Component Value Date   PTH 21 10/08/2016   CALCIUM 9.7 08/26/2019   CALCIUM 10.5 07/15/2017   CALCIUM 9.8 10/08/2016   CALCIUM 10.3 12/01/2015   CALCIUM 9.8 11/17/2015   24-hour urine calcium was normal: Component     Latest Ref Rng & Units 10/14/2016  Creatinine, Urine     20 - 370 mg/dL 121  Creatinine, 24H Ur     0.63 - 2.50 g/24 h 1.57  Calcium, Ur     Not estab mg/dL 23  Calcium, 24 hour urine     55 - 300 mg/24 h 299   No thyrotoxicosis.  Reviewed TSH levels: Lab Results  Component Value Date   TSH 1.13 08/26/2019   TSH 1.35 07/15/2017   TSH 1.26 08/13/2016   No CKD.  Reviewed BUN/creatinine levels: Lab Results  Component Value Date   BUN 8 08/26/2019   BUN 9 07/15/2017   CREATININE 0.75 08/26/2019   CREATININE 0.75 07/15/2017   Multiple myeloma and celiac disease work-up were negative: Component     Latest Ref Rng & Units 10/08/2016  Protein Urine Random     Not Estab. mg/dL 19.8  Albumin ELP, Urine     % 40.3  Alpha-1-Globulin, U     % 2.1  ALPHA-2-GLOBULIN, U     % 15.1  Beta Globulin, U     % 26.9  Gamma Globulin, U     % 15.7  M Component, Ur     Not Observed % Not Observed  Please Note:      Comment  Antigliadin Abs, IgA     0 - 19 units 2  Transglutaminase IgA     0 - 3 U/mL <2  IgA/Immunoglobulin A, Serum     90 - 386 mg/dL 140   ROS: Constitutional: no weight gain/no weight loss, no fatigue, no subjective hyperthermia, no subjective hypothermia Eyes: no blurry vision, no xerophthalmia ENT: no sore throat, no nodules palpated in neck, no dysphagia, no odynophagia, no hoarseness Cardiovascular: no CP/no SOB/no palpitations/no leg swelling Respiratory: no cough/no SOB/no wheezing Gastrointestinal: no N/no V/no D/no C/no  acid reflux Musculoskeletal: no muscle aches/no joint aches Skin: no rashes, no hair loss Neurological: no tremors/+ numbness/+ tingling/no dizziness  I reviewed pt's medications, allergies, PMH, social hx, family hx, and changes were documented in the history of present illness. Otherwise, unchanged from my initial visit  note.  Past Medical History:  Diagnosis Date  . Abdominal pain 12/01/2015  . Asthma   . Blood in urine   . Diabetes mellitus   . GERD (gastroesophageal reflux disease)   . Hyperlipidemia    notes only hx of hyperlipidemia  . Loss of weight 12/06/2015  . MR (mental retardation), moderate   . MRSA (methicillin resistant staph aureus) culture positive   . Osteoporosis 08/18/2016  . Routine general medical examination at a health care facility 10/15/2012    Past Surgical History:  Procedure Laterality Date  . HIP SURGERY     Left  . INTRAMEDULLARY (IM) NAIL INTERTROCHANTERIC Left 04/10/2014  . INTRAMEDULLARY (IM) NAIL INTERTROCHANTERIC Left 04/10/2014   Procedure: INTRAMEDULLARY (IM) NAIL INTERTROCHANTRIC LEFT HIP;  Surgeon: Renette Butters, MD;  Location: [facility];  Service: Orthopedics;  Laterality: Left;    Social History   Socioeconomic History  . Marital status: Single    Spouse name: Not on file  . Number of children: Not on file  . Years of education: Not on file  . Highest education level: Not on file  Occupational History  . Not on file  Tobacco Use  . Smoking status: Never Smoker  . Smokeless tobacco: Never Used  Substance and Sexual Activity  . Alcohol use: No  . Drug use: No  . Sexual activity: Never  Other Topics Concern  . Not on file  Social History Narrative  . Not on file   Social Determinants of Health   Financial Resource Strain:   . Difficulty of Paying Living Expenses:   Food Insecurity:   . Worried About Charity fundraiser in the Last Year:   . Arboriculturist in the Last Year:   Transportation Needs:   . Lexicographer (Medical):   Marland Kitchen Lack of Transportation (Non-Medical):   Physical Activity:   . Days of Exercise per Week:   . Minutes of Exercise per Session:   Stress:   . Feeling of Stress :   Social Connections:   . Frequency of Communication with Friends and Family:   . Frequency of Social Gatherings with Friends and Family:   . Attends Religious Services:   . Active Member of Clubs or Organizations:   . Attends Archivist Meetings:   Marland Kitchen Marital Status:   Intimate Partner Violence:   . Fear of Current or Ex-Partner:   . Emotionally Abused:   Marland Kitchen Physically Abused:   . Sexually Abused:     Current Outpatient Medications on File Prior to Visit  Medication Sig Dispense Refill  . ACCU-CHEK AVIVA PLUS test strip USE TO CHECK BLOOD SUGAR TWICE DAILY 200 strip 11  . ACCU-CHEK FASTCLIX LANCETS MISC Check blood sugars 2 times daily. 200 each 12  . acetaminophen (TYLENOL) 500 MG tablet Take 500 mg by mouth every 4 (four) hours as needed (pain; fever >100).     Marland Kitchen albuterol (PROVENTIL HFA;VENTOLIN HFA) 108 (90 BASE) MCG/ACT inhaler Inhale 2 puffs into the lungs 4 (four) times daily as needed for wheezing or shortness of breath. Shortness of breath/wheezing    . alendronate (FOSAMAX) 70 MG tablet TAKE 1 TABLET BY MOUTH EVERY 7 DAYS. TAKE WITH A FULL GLASS OF WATER ON AN EMPTY STOMACH. 12 tablet 3  . B-D UF III MINI PEN NEEDLES 31G X 5 MM MISC USE AS DIRECTED 100 each 11  . Calcium Carbonate (CALCIUM 600 PO) Take by mouth 2 (two) times daily.    Marland Kitchen  Cholecalciferol (VITAMIN D) 2000 units CAPS Take by mouth.    . diphenhydrAMINE (BENADRYL) 25 MG tablet Take 25-50 mg by mouth every 6 (six) hours as needed (allergic reaction). Take 2 tablets (50 mg) for ingestion of shellfish and seek emergency help.    . docusate sodium (COLACE) 100 MG capsule Take 1 capsule (100 mg total) by mouth 2 (two) times daily. Continue this while taking narcotics to help with bowel movements (Patient taking  differently: Take 100 mg by mouth 2 (two) times daily. Continue this while taking narcotics to help with bowel movements  PRN) 30 capsule 1  . famotidine (PEPCID) 40 MG tablet Take 1 tablet (40 mg total) by mouth at bedtime. 90 tablet 3  . fluticasone (FLONASE) 50 MCG/ACT nasal spray Place 1 spray into both nostrils daily as needed for allergies or rhinitis (PRN).     Marland Kitchen glipiZIDE (GLUCOTROL) 5 MG tablet Take 1 tablet (5 mg total) by mouth daily before supper. 30 tablet 6  . guaiFENesin-dextromethorphan (ROBITUSSIN DM) 100-10 MG/5ML syrup Take 15 mLs by mouth every 4 (four) hours as needed for cough (cold symptoms). Reported on 08/17/2015    . ibuprofen (ADVIL,MOTRIN) 200 MG tablet Take 200 mg by mouth every 4 (four) hours as needed.    . Insulin Pen Needle (CAREFINE PEN NEEDLES) 32G X 4 MM MISC Use 1x a day 100 each 3  . JANUVIA 100 MG tablet TAKE 1 TABLET BY MOUTH ONCE DAILY BEFORE BREAKFAST. 31 tablet 2  . LANTUS SOLOSTAR 100 UNIT/ML Solostar Pen INJECT 11 UNITS INTO THE SKIN DAILY AT 10 PM 15 mL 4  . loperamide (IMODIUM A-D) 2 MG tablet Take 2 mg by mouth as needed for diarrhea or loose stools. Take 2 caplets  or 4 tsp (20 mls) liquid by mouth after first loose stool and 1 caplet or 1 tsp (5 mls) liquid by mouth for each subsequent stool as needed; not to exceed 8 caplets or 50 mls in 24 hours    . loratadine (CLARITIN) 10 MG tablet Take 1 tablet (10 mg total) by mouth daily. 30 tablet 11  . losartan (COZAAR) 25 MG tablet TAKE 1 TABLET BY MOUTH ONCE DAILY. 31 tablet PRN  . metFORMIN (GLUCOPHAGE) 1000 MG tablet TAKE 1 TABLET BY MOUTH TWICE DAILY WITH A MEAL. 62 tablet PRN  . montelukast (SINGULAIR) 10 MG tablet TAKE 1 TABLET BY MOUTH ONCE DAILY. 31 tablet PRN   No current facility-administered medications on file prior to visit.    Allergies  Allergen Reactions  . Shellfish Allergy Anaphylaxis  . Iohexol     IVP Dye    Family History  Problem Relation Age of Onset  . Alcohol abuse Father    . Cancer Maternal Grandmother   . Hyperlipidemia Maternal Grandmother   . Heart disease Maternal Grandmother   . Hypertension Maternal Grandmother   . Heart disease Maternal Grandfather   . Hypertension Maternal Grandfather   . Hyperlipidemia Maternal Grandfather   . Hyperlipidemia Paternal Grandmother   . Heart disease Paternal Grandmother   . Hypertension Paternal Grandmother   . Kidney disease Paternal Grandmother   . Diabetes Paternal Grandmother   . Heart disease Paternal Grandfather   . Hypertension Paternal Grandfather   . Hyperlipidemia Paternal Grandfather   . Alcohol abuse Mother    Pt has FH of DM in father and Paternal GPs.  PE: BP 118/70   Pulse 96   Ht 5' 11"  (1.803 m)   Wt 143 lb (64.9  kg)   SpO2 98%   BMI 19.94 kg/m  Wt Readings from Last 3 Encounters:  11/02/19 143 lb (64.9 kg)  08/26/19 145 lb (65.8 kg)  07/28/18 138 lb (62.6 kg)   Constitutional: thin, in NAD Eyes: PERRLA, EOMI, no exophthalmos ENT: moist mucous membranes, no thyromegaly, no cervical lymphadenopathy Cardiovascular: RRR, No MRG Respiratory: CTA B Gastrointestinal: abdomen soft, NT, ND, BS+ Musculoskeletal: no deformities, strength intact in all 4 Skin: moist, warm, no rashes Neurological: no tremor with outstretched hands, DTR normal in all 4  ASSESSMENT: 1. DM1.5, insulin-dependent, uncontrolled, without long term complications  We investigated him for type I DM and GAD Abs were positive >> Type 1.5 DM Component     Latest Ref Rng & Units 10/08/2016  Glucose     65 - 99 mg/dL 73  Pancreatic Islet Cell Antibody     <5 JDF Units <5  Glutamic Acid Decarb Ab     <5 IU/mL 14 (H)  C-Peptide     0.80 - 3.85 ng/mL 1.29   2. Low BMD for age  PLAN:  1. Patient with history of controlled diabetes type 1.5, with improved control due to the coronavirus pandemic as he was not able to eat out.  At last visit, he was out of the group home and living with his aunt and sister.  Last visit  was virtual, so we could not check another HbA1c.  However, latest value reviewed from 08/26/2019 was 6.4%, excellent, stable from 03/2019. -At last visit, since sugars were slightly higher than target in the morning, we discussed about trying to move the entire metformin dose at night.  He could not tolerate this, though. -At this visit, he does not have any significant hyperglycemic spikes or low blood sugars.  Sugars are actually better controlled than at last visit, mostly due to improved diet.  No medication changes are necessary at this time.  -He is starting to do his own insulin injections, and I congratulated him for this. - I suggested to:  Patient Instructions  Please continue: - Metformin 1000 mg 2x a day with meals - Glipizide 5 mg  30 min before a large dinner  - Januvia 100 mg daily in am - Lantus 11 units at bedtime  Please continue Fosamax 70 mg weekly.  Please stop at the lab.  Please return in 6 months with your sugar log.   - we checked his HbA1c: 6.1% (better) - advised to check sugars at different times of the day - 1x a day, rotating check times - advised for yearly eye exams >> he is UTD - return to clinic in 6 months     2. Low BMD for age - Patient had a hip fracture while rollerskating 2015.  At that time, DXA scan showed mild low bone mineral density for age.  We started him on calcium and vitamin D 2000 units daily.  He continues on these now.  Vitamin D level was normal at last check.  Repeat DXA scan in 2018 showed worsening Z scores.  At that time, we started Fosamax 70 mg weekly.  He tolerates this well, without side effects.  He is due for another DXA scan but he could not have this during the coronavirus pandemic.  He has the schedule today.   -We discussed that if the T-scores are worse, he may need parenteral therapy with either Reclast or Prolia. -Investigation for secondary causes for osteoporosis was negative: No vitamin D deficiency, no kidney  disease, thyroid disease, hypophosphatasia, anemia, celiac disease, multiple myeloma, hyperparathyroidism, 24 hydroxylase deficiency, calcium malabsorption. -At this visit, we will recheck his vitamin D level.  Of note, I reviewed these labs from 03/2019 and his kidney function was normal.  Vitamin D level returned elevated.  He continues on 2000 units vitamin D daily.  Office Visit on 11/02/2019  Component Date Value Ref Range Status  . Vit D, 25-Hydroxy 11/02/2019 52.0  30.0 - 100.0 ng/mL Final   Comment: Vitamin D deficiency has been defined by the [place] practice guideline as a level of serum 25-OH vitamin D less than 20 ng/mL (1,2). The Endocrine Society went on to further define vitamin D insufficiency as a level between 21 and 29 ng/mL (2). 1. IOM (Institute of Medicine). 2010. Dietary reference    intakes for calcium and D. [place]: The    [employer]. 2. Holick MF, Binkley Hawthorne, Bischoff-Ferrari HA, et al.    Evaluation, treatment, and prevention of vitamin D    deficiency: an Endocrine Society clinical practice    guideline. JCEM. 2011 Jul; 96(7):1911-30.   Marland Kitchen Hemoglobin A1C 11/02/2019 6.1* 4.0 - 5.6 % Final   Vitamin D level is normal.  Aunt: [phone] -needs to be called with lab results  Address: [address] [place], [place], [address]  Z-score Lumbar spine (L1-L4) Femoral neck (FN) 33% distal radius UD radius  11/02/2019 ([facility]) -1.8 RFN: -2.2 LFN: n/a -0.9 n/a  08/13/2016 ([facility]) -2.6 RFN: -2.4 LFN: n/a -0.1 n/a  05/04/2014 -2.1 RFN: -2.2 +0.6 +0.1   Bone density improved significantly at the level of the spine, and it is lower but still in the normal range L distal radius.  Of note, his PTH and calcium levels have been normal in the past.  For now, we will continue the current regimen.  Philemon Kingdom, MD PhD [facility]

## 2019-11-02 NOTE — Patient Instructions (Addendum)
Please continue: - Metformin 1000 mg 2x a day with meals - Glipizide 5 mg  30 min before a large dinner  - Januvia 100 mg daily in am - Lantus 11 units at bedtime  Please continue Fosamax 70 mg weekly.  Please stop at the lab.  Please return in 6 months with your sugar log.

## 2019-11-03 LAB — VITAMIN D 25 HYDROXY (VIT D DEFICIENCY, FRACTURES): Vit D, 25-Hydroxy: 52 ng/mL (ref 30.0–100.0)

## 2019-11-08 ENCOUNTER — Encounter: Payer: Self-pay | Admitting: Internal Medicine

## 2019-12-03 ENCOUNTER — Other Ambulatory Visit: Payer: Self-pay | Admitting: Family Medicine

## 2019-12-18 ENCOUNTER — Other Ambulatory Visit: Payer: Self-pay | Admitting: Internal Medicine

## 2020-01-01 ENCOUNTER — Other Ambulatory Visit: Payer: Self-pay | Admitting: Internal Medicine

## 2020-01-03 ENCOUNTER — Encounter: Payer: Self-pay | Admitting: Family Medicine

## 2020-01-03 ENCOUNTER — Other Ambulatory Visit: Payer: Self-pay | Admitting: Family Medicine

## 2020-01-03 MED ORDER — KETOCONAZOLE 2 % EX CREA: 1.0000 "application " | TOPICAL_CREAM | Freq: Every day | CUTANEOUS | 1 refills | Status: DC

## 2020-02-03 ENCOUNTER — Encounter: Payer: Self-pay | Admitting: Family Medicine

## 2020-02-03 ENCOUNTER — Other Ambulatory Visit: Payer: Self-pay | Admitting: Family Medicine

## 2020-02-03 MED ORDER — FLUCONAZOLE 150 MG PO TABS: ORAL_TABLET | ORAL | 1 refills | Status: AC

## 2020-02-03 MED ORDER — KETOCONAZOLE 2 % EX CREA: 1.0000 "application " | TOPICAL_CREAM | Freq: Every day | CUTANEOUS | 1 refills | Status: AC

## 2020-02-03 NOTE — Telephone Encounter (Signed)
Please advise 

## 2020-02-03 NOTE — Progress Notes (Unsigned)
diflucan 

## 2020-02-08 ENCOUNTER — Other Ambulatory Visit: Payer: Self-pay | Admitting: Family Medicine

## 2020-02-21 DIAGNOSIS — B351 Tinea unguium: Secondary | ICD-10-CM | POA: Diagnosis not present

## 2020-02-21 DIAGNOSIS — M79674 Pain in right toe(s): Secondary | ICD-10-CM | POA: Diagnosis not present

## 2020-02-21 DIAGNOSIS — M79675 Pain in left toe(s): Secondary | ICD-10-CM | POA: Diagnosis not present

## 2020-03-06 ENCOUNTER — Other Ambulatory Visit: Payer: Self-pay | Admitting: Internal Medicine

## 2020-03-09 DIAGNOSIS — L304 Erythema intertrigo: Secondary | ICD-10-CM | POA: Diagnosis not present

## 2020-03-09 DIAGNOSIS — L209 Atopic dermatitis, unspecified: Secondary | ICD-10-CM | POA: Diagnosis not present

## 2020-04-21 ENCOUNTER — Other Ambulatory Visit: Payer: Self-pay | Admitting: Internal Medicine

## 2020-05-03 ENCOUNTER — Ambulatory Visit: Payer: Medicare Other | Admitting: Internal Medicine

## 2020-05-04 ENCOUNTER — Other Ambulatory Visit: Payer: Self-pay | Admitting: Family Medicine

## 2020-05-04 ENCOUNTER — Other Ambulatory Visit: Payer: Self-pay | Admitting: Internal Medicine

## 2020-07-05 ENCOUNTER — Other Ambulatory Visit: Payer: Self-pay | Admitting: Internal Medicine

## 2020-07-07 ENCOUNTER — Other Ambulatory Visit: Payer: Self-pay | Admitting: Internal Medicine

## 2020-07-07 ENCOUNTER — Encounter: Payer: Self-pay | Admitting: Family Medicine

## 2020-07-07 ENCOUNTER — Encounter: Payer: Self-pay | Admitting: Internal Medicine

## 2020-07-07 MED ORDER — MONTELUKAST SODIUM 10 MG PO TABS: ORAL_TABLET | ORAL | 99 refills | Status: AC

## 2020-07-07 MED ORDER — ALBUTEROL SULFATE HFA 108 (90 BASE) MCG/ACT IN AERS: 2.0000 | INHALATION_SPRAY | Freq: Four times a day (QID) | RESPIRATORY_TRACT | 3 refills | Status: AC | PRN

## 2020-07-07 MED ORDER — LOSARTAN POTASSIUM 25 MG PO TABS: 25.0000 mg | ORAL_TABLET | Freq: Every day | ORAL | 99 refills | Status: DC

## 2020-07-07 MED ORDER — FAMOTIDINE 40 MG PO TABS
40.0000 mg | ORAL_TABLET | Freq: Every day | ORAL | 3 refills | Status: DC
Start: 2020-07-07 — End: 2020-09-28

## 2020-07-10 ENCOUNTER — Other Ambulatory Visit: Payer: Self-pay | Admitting: Internal Medicine

## 2020-07-10 MED ORDER — SITAGLIPTIN PHOSPHATE 100 MG PO TABS
ORAL_TABLET | ORAL | 3 refills | Status: DC
Start: 2020-07-10 — End: 2020-10-03

## 2020-07-10 MED ORDER — METFORMIN HCL 1000 MG PO TABS: ORAL_TABLET | ORAL | 3 refills | Status: DC

## 2020-07-10 MED ORDER — ALENDRONATE SODIUM 70 MG PO TABS: ORAL_TABLET | ORAL | 3 refills | Status: DC

## 2020-08-28 ENCOUNTER — Ambulatory Visit: Payer: Medicare Other | Admitting: Internal Medicine

## 2020-08-28 ENCOUNTER — Encounter: Payer: Medicare Other | Admitting: Family Medicine

## 2020-09-21 ENCOUNTER — Telehealth: Payer: Self-pay | Admitting: Family Medicine

## 2020-09-21 NOTE — Telephone Encounter (Signed)
Spoke with Claiborne Billings he needs paperwork sent in for pt. Claiborne Billings would like paperwork sent to [phone] when it's completed.

## 2020-09-21 NOTE — Telephone Encounter (Signed)
Paper work faxed and sent back

## 2020-09-21 NOTE — Telephone Encounter (Signed)
Caller : Sharyne Peach  Call Back @ [phone]  Patient is going into a group home at the end of the month, Group Home Leader Claiborne Billings is calling to in reference to a LOC from to be signed and sent back to the group home  .Claiborne Billings would like a call back to go over form before her sends to your office.    Please advise

## 2020-09-25 ENCOUNTER — Encounter: Payer: Self-pay | Admitting: Internal Medicine

## 2020-09-25 DIAGNOSIS — E139 Other specified diabetes mellitus without complications: Secondary | ICD-10-CM

## 2020-09-26 ENCOUNTER — Telehealth: Payer: Self-pay | Admitting: Internal Medicine

## 2020-09-26 MED ORDER — GLIPIZIDE 5 MG PO TABS: ORAL_TABLET | ORAL | 3 refills | Status: DC

## 2020-09-26 NOTE — Telephone Encounter (Signed)
Pharmacy calling regarding glipiZIDE (GLUCOTROL) 5 MG tablet. Pt has been taking it sch at dinner, prescription  that was written today says PRN Pharmacy is wanting clarification    [place], [place]

## 2020-09-26 NOTE — Telephone Encounter (Signed)
Pharmacy contacted and advised rx updated per caregiver request.

## 2020-09-28 ENCOUNTER — Telehealth: Payer: Self-pay | Admitting: *Deleted

## 2020-09-28 MED ORDER — FAMOTIDINE 40 MG PO TABS: 40.0000 mg | ORAL_TABLET | Freq: Every day | ORAL | 0 refills | Status: DC

## 2020-09-28 MED ORDER — LORATADINE 10 MG PO TABS: 10.0000 mg | ORAL_TABLET | Freq: Every day | ORAL | 0 refills | Status: AC

## 2020-09-28 MED ORDER — FLUTICASONE PROPIONATE 50 MCG/ACT NA SUSP: 1.0000 | Freq: Every day | NASAL | 0 refills | Status: AC | PRN

## 2020-09-28 NOTE — Telephone Encounter (Signed)
Called and left message on machine for patient to call back to make an appointment before refills could sent in.  Appt was made with Percell Miller and rxs sent in.

## 2020-09-29 ENCOUNTER — Telehealth: Payer: Self-pay | Admitting: Internal Medicine

## 2020-09-29 NOTE — Telephone Encounter (Signed)
Sharyne Peach (clinician with the group home patient just moved into) called stating patient needed all his prescriptions with Dr Cruzita Lederer transferred to the new pharmacy with the group home.  PHARMACY:  [facility] [address] [place], [place] [address] Ph# [phone] Fax# [phone]

## 2020-10-02 ENCOUNTER — Encounter: Payer: Self-pay | Admitting: Internal Medicine

## 2020-10-03 ENCOUNTER — Encounter: Payer: Self-pay | Admitting: Internal Medicine

## 2020-10-03 ENCOUNTER — Telehealth: Payer: Self-pay | Admitting: Internal Medicine

## 2020-10-03 ENCOUNTER — Ambulatory Visit: Payer: Medicare Other | Admitting: Medical

## 2020-10-03 ENCOUNTER — Other Ambulatory Visit: Payer: Self-pay

## 2020-10-03 ENCOUNTER — Ambulatory Visit (INDEPENDENT_AMBULATORY_CARE_PROVIDER_SITE_OTHER): Payer: Medicare Other | Admitting: Internal Medicine

## 2020-10-03 VITALS — BP 110/78 | HR 97 | Ht 71.0 in | Wt 150.0 lb

## 2020-10-03 DIAGNOSIS — E139 Other specified diabetes mellitus without complications: Secondary | ICD-10-CM

## 2020-10-03 DIAGNOSIS — M8580 Other specified disorders of bone density and structure, unspecified site: Secondary | ICD-10-CM

## 2020-10-03 DIAGNOSIS — M859 Disorder of bone density and structure, unspecified: Secondary | ICD-10-CM

## 2020-10-03 LAB — POCT GLYCOSYLATED HEMOGLOBIN (HGB A1C): Hemoglobin A1C: 9.2 % — AB (ref 4.0–5.6)

## 2020-10-03 MED ORDER — SITAGLIPTIN PHOSPHATE 100 MG PO TABS: ORAL_TABLET | ORAL | 3 refills | Status: DC

## 2020-10-03 MED ORDER — METFORMIN HCL 1000 MG PO TABS
ORAL_TABLET | ORAL | 3 refills | Status: DC
Start: 2020-10-03 — End: 2021-10-12

## 2020-10-03 MED ORDER — BD PEN NEEDLE MINI U/F 31G X 5 MM MISC: 1 refills | Status: DC

## 2020-10-03 MED ORDER — CAREFINE PEN NEEDLES 32G X 4 MM MISC: 3 refills | Status: DC

## 2020-10-03 MED ORDER — ACCU-CHEK AVIVA PLUS VI STRP: ORAL_STRIP | 3 refills | Status: DC

## 2020-10-03 MED ORDER — LANTUS SOLOSTAR 100 UNIT/ML ~~LOC~~ SOPN: PEN_INJECTOR | SUBCUTANEOUS | 4 refills | Status: DC

## 2020-10-03 MED ORDER — ACCU-CHEK FASTCLIX LANCETS MISC
3 refills | Status: DC
Start: 2020-10-03 — End: 2021-10-12

## 2020-10-03 MED ORDER — ALENDRONATE SODIUM 70 MG PO TABS: ORAL_TABLET | ORAL | 3 refills | Status: DC

## 2020-10-03 MED ORDER — GLIPIZIDE 5 MG PO TABS: ORAL_TABLET | ORAL | 3 refills | Status: DC

## 2020-10-03 NOTE — Patient Instructions (Addendum)
Please continue: - Metformin 1000 mg 2x a day with meals - Glipizide 5 mg  30 min before a large dinner  - Januvia 100 mg daily in am  Please increase: - Lantus 15 units at bedtime  Please check blood sugars in am and at bedtime.  Send me the sugar log in 1 week.  Call with sugars <70 or >250.  Please continue Fosamax 70 mg weekly.  Adam Fitzpatrick can inject his own insulin.  Please return in 1.5 months with your sugar log.

## 2020-10-03 NOTE — Telephone Encounter (Signed)
Called and spoke with Adam Fitzpatrick who advised Adam Fitzpatrick was interested in a CGM and pump. Advised a referral may be needed for the educator to go over the different options. Please advise

## 2020-10-03 NOTE — Telephone Encounter (Signed)
We can definitely prescribe a CGM for him, can you please have him check with his insurance whether they cover the Dexcom or the freestyle libre 2 CGM and where should we call it in: To the pharmacy or to a supplier.  I do not feel he is a good candidate for a pump, though, which involves a lot more involvement on his part to troubleshoot.

## 2020-10-03 NOTE — Progress Notes (Signed)
Patient ID: JORELL AGNE, adult   DOB: 06/12/1980, 40 y.o.   MRN: [id number]   This visit occurred during the SARS-CoV-2 public health emergency.  Safety protocols were in place, including screening questions prior to the visit, additional usage of staff PPE, and extensive cleaning of exam room while observing appropriate contact time as indicated for disinfecting solutions.   HPI: JERVIS TRAPANI) is a 40 y.o. man, returning for f/u for DM1.5, dx as DM2 in 2011, insulin-dependent, uncontrolled, without long-term complications and low BMD for age.  He usually comes with his aunt, who offers most of the history, as patient is very quiet, and mostly nonverbal.  At today's visit, he is here with a caregiver from his new facility.  Last visit 11 months ago.  Interim history: Since last visit, Shanon Brow moved into a new facility in the last week.  At today's visit, they sent him over without his blood sugar log.  Neither Shanon Brow nor the caregiver can give me information about his blood sugars.  However, they mentioned that at the previous facility, he was not given the diabetic regimen as prescribed.  They are now giving him the recommended regimen consistently. He has no complaints at this visit.  DM1.5:  Reviewed HbA1c levels: Lab Results  Component Value Date   HGBA1C 6.1 (A) 11/02/2019   HGBA1C 6.4 08/26/2019   HGBA1C 6.4 03/12/2019   HGBA1C 7.7 (A) 07/28/2018   HGBA1C 6.0 (A) 03/26/2018   HGBA1C 6.7 (A) 11/18/2017   HGBA1C 6.6 07/16/2017   HGBA1C 6.8 (H) 07/15/2017   HGBA1C 7.5 04/15/2017   HGBA1C 7.5 01/28/2017   He is on: - Metformin 1000 mg 2x a day, with meals >> 2000 mg with dinner >> 1000 mg 2x a day with meals - Glipizide 5 mg 30 minutes before a large dinner - Onglyza 5 mg daily in am >>  Januvia 100 mg daily in a.m. - Lantus 9 >> 11 units at bedtime (increased 07/2018)  He checks his blood sugars once a day at bedtime. No log or meter at this visit.... They cannot  remember the values.  From last visit: - am: 85-150, 218, 265 >> 95-141 (ave 130-140) >> 92-129 - 2h after b'fast: n/c >> 138, 145 >> n/c - before lunch: n/c >> 145 >> n/c - 2h after lunch: n/c >> 128 >> n/c - before dinner: n/c >> 138 >> n/c - 2h after dinner: 78-184, 269, 315, 335 >> 100-277 (ave 140) >> 112-182 - bedtime: n/c - nighttime: n/c Lowest sugar was 67 >> 78 >> 95 >> 92; it is unclear at which level he has hypoglycemia awareness. Highest sugar was 285 >> 335 >> 277 >> 182.  Glucometer: AccuChek Aviva  Pt's meals are: - Breakfast: oatmeal, eggs, bacon, sausage, pancakes (sugarfree syrup) - Lunch: 2 PB sandwiches on wheat - Dinner: low carb - Snacks: before bedtime He lost a significant amount of weight before starting insulin: 25-30 lbs.  -No CKD, last BUN/creatinine:  Lab Results  Component Value Date   BUN 8 08/26/2019   BUN 9 07/15/2017   CREATININE 0.75 08/26/2019   CREATININE 0.75 07/15/2017  On losartan. - No HL; last set of lipids was normal: Lab Results  Component Value Date   CHOL 145 08/26/2019   HDL 53.10 08/26/2019   LDLCALC 80 08/26/2019   TRIG 59.0 08/26/2019   CHOLHDL 3 08/26/2019  He is not on a statin. - last eye exam was in 2020: No DR  reportedly.  Prev. Dr. Nancee Liter in [place]. Now another Dr - will let me know. - + numbness and tingling in his feet.    B12 levels were normal: Lab Results  Component Value Date   VITAMINB12 458 03/26/2018   VITAMINB12 549 11/17/2015   Low BMD for age:  Reviewed history:  Reviewed the reports of available DXA scans: Z-score Lumbar spine (L1-L4) Femoral neck (FN) 33% distal radius UD radius  11/02/2019 ([facility]) -1.8 RFN: -2.2 LFN: n/a -0.9 n/a  08/13/2016 ([facility]) -2.6 RFN: -2.4 LFN: n/a -0.1 n/a  05/04/2014 -2.1 RFN: -2.2 +0.6 +0.1   He denies dizziness/vertigo/orthostasis.  He had a left hip fracture in 2018 while [place] [employer].  Vitamin D level was normal at last  check: 03/12/2019: Vitamin D 59.1 (13-47.8) Lab Results  Component Value Date   VD25OH 52.0 11/02/2019   VD25OH 82.12 03/26/2018   VD25OH 72.84 11/17/2015   Calcitriol level was normal: Component     Latest Ref Rng & Units 10/08/2016  Vitamin D 1, 25 (OH) Total     18 - 72 pg/mL 31  Vitamin D3 1, 25 (OH)     pg/mL reviewed and addended history: 31  Vitamin D2 1, 25 (OH)     pg/mL <8   He continues on calcium 600 mg twice a day and vitamin D 2000 units daily.  Restarting Fosamax 11/2016.  No side effects..  No weightbearing exercises.  She is not on vitamin A supplements  Pt does have a FH of osteoporosis: MGM.  No hypo or hypercalcemia or hyperparathyroidism.  No history of kidney stones. 03/12/2019: calcium 9.4 (8.4-10.2) Lab Results  Component Value Date   PTH 21 10/08/2016   CALCIUM 9.7 08/26/2019   CALCIUM 10.5 07/15/2017   CALCIUM 9.8 10/08/2016   CALCIUM 10.3 12/01/2015   CALCIUM 9.8 11/17/2015   24-hour urine calcium was normal: Component     Latest Ref Rng & Units 10/14/2016  Creatinine, Urine     20 - 370 mg/dL 121  Creatinine, 24H Ur     0.63 - 2.50 g/24 h 1.57  Calcium, Ur     Not estab mg/dL 23  Calcium, 24 hour urine     55 - 300 mg/24 h 299   No thyrotoxicosis.  Reviewed TSH levels: Lab Results  Component Value Date   TSH 1.13 08/26/2019   TSH 1.35 07/15/2017   TSH 1.26 08/13/2016   No CKD.  Reviewed BUN/creatinine levels: Lab Results  Component Value Date   BUN 8 08/26/2019   BUN 9 07/15/2017   CREATININE 0.75 08/26/2019   CREATININE 0.75 07/15/2017   Multiple myeloma and celiac disease work-up were negative: Component     Latest Ref Rng & Units 10/08/2016  Protein Urine Random     Not Estab. mg/dL 19.8  Albumin ELP, Urine     % 40.3  Alpha-1-Globulin, U     % 2.1  ALPHA-2-GLOBULIN, U     % 15.1  Beta Globulin, U     % 26.9  Gamma Globulin, U     % 15.7  M Component, Ur     Not Observed % Not Observed  Please Note:       Comment  Antigliadin Abs, IgA     0 - 19 units 2  Transglutaminase IgA     0 - 3 U/mL <2  IgA/Immunoglobulin A, Serum     90 - 386 mg/dL 140   ROS: Constitutional: no weight gain/no  weight loss, no fatigue Eyes: no blurry vision ENT: no sore throat, no dysphagia Cardiovascular: no CP/no SOB/no palpitations/no leg swelling Respiratory: no cough/no SOB/no wheezing Gastrointestinal: no N/no V/no D/no C/no acid reflux Musculoskeletal: no muscle aches/no joint aches Skin: no rashes, no hair loss Neurological: no tremors//no dizziness  I reviewed pt's medications, allergies, PMH, social hx, family hx, and changes were documented in the history of present illness. Otherwise, unchanged from my initial visit note.  Past Medical History:  Diagnosis Date  . Abdominal pain 12/01/2015  . Asthma   . Blood in urine   . Diabetes mellitus   . GERD (gastroesophageal reflux disease)   . Hyperlipidemia    notes only hx of hyperlipidemia  . Loss of weight 12/06/2015  . MR (mental retardation), moderate   . MRSA (methicillin resistant staph aureus) culture positive   . Osteoporosis 08/18/2016  . Routine general medical examination at a health care facility 10/15/2012    Past Surgical History:  Procedure Laterality Date  . HIP SURGERY     Left  . INTRAMEDULLARY (IM) NAIL INTERTROCHANTERIC Left 04/10/2014  . INTRAMEDULLARY (IM) NAIL INTERTROCHANTERIC Left 04/10/2014   Procedure: INTRAMEDULLARY (IM) NAIL INTERTROCHANTRIC LEFT HIP;  Surgeon: Renette Butters, MD;  Location: [facility];  Service: Orthopedics;  Laterality: Left;    Social History   Socioeconomic History  . Marital status: Single    Spouse name: Not on file  . Number of children: Not on file  . Years of education: Not on file  . Highest education level: Not on file  Occupational History  . Not on file  Tobacco Use  . Smoking status: Never Smoker  . Smokeless tobacco: Never Used  Substance and Sexual Activity  . Alcohol use: No   . Drug use: No  . Sexual activity: Never  Other Topics Concern  . Not on file  Social History Narrative  . Not on file   Social Determinants of Health   Financial Resource Strain: Not on file  Food Insecurity: Not on file  Transportation Needs: Not on file  Physical Activity: Not on file  Stress: Not on file  Social Connections: Not on file  Intimate Partner Violence: Not on file    Current Outpatient Medications on File Prior to Visit  Medication Sig Dispense Refill  . Accu-Chek FastClix Lancets MISC CHECK BLOOD SUGARS TWICE DAILY 204 each 11  . ACCU-CHEK AVIVA PLUS test strip USE TO CHECK BLOOD SUGAR TWICE DAILY 200 strip 11  . acetaminophen (TYLENOL) 500 MG tablet Take 500 mg by mouth every 4 (four) hours as needed (pain; fever >100).     Marland Kitchen albuterol (VENTOLIN HFA) 108 (90 Base) MCG/ACT inhaler Inhale 2 puffs into the lungs 4 (four) times daily as needed for wheezing or shortness of breath. Shortness of breath/wheezing 1 each 3  . alendronate (FOSAMAX) 70 MG tablet TAKE 1 TABLET BY MOUTH EVERY 7 DAYS. TAKE WITH A FULL GLASS OF WATER ON AN EMPTY STOMACH. 12 tablet 3  . B-D UF III MINI PEN NEEDLES 31G X 5 MM MISC USE AS DIRECTED 100 each 1  . Calcium Carbonate (CALCIUM 600 PO) Take by mouth 2 (two) times daily.    . Cholecalciferol (VITAMIN D) 2000 units CAPS Take by mouth.    . diphenhydrAMINE (BENADRYL) 25 MG tablet Take 25-50 mg by mouth every 6 (six) hours as needed (allergic reaction). Take 2 tablets (50 mg) for ingestion of shellfish and seek emergency help.    . docusate  sodium (COLACE) 100 MG capsule Take 1 capsule (100 mg total) by mouth 2 (two) times daily. Continue this while taking narcotics to help with bowel movements (Patient taking differently: Take 100 mg by mouth 2 (two) times daily. Continue this while taking narcotics to help with bowel movements  PRN) 30 capsule 1  . famotidine (PEPCID) 40 MG tablet Take 1 tablet (40 mg total) by mouth at bedtime. 90 tablet 0  .  fluconazole (DIFLUCAN) 150 MG tablet 1 tab po daily x 3 days then repeat again in 1 week 6 tablet 1  . fluticasone (FLONASE) 50 MCG/ACT nasal spray Place 1 spray into both nostrils daily as needed for allergies or rhinitis (PRN). 48 mL 0  . glipiZIDE (GLUCOTROL) 5 MG tablet TAKE 1 TABLET (=TO 5MG) BY MOUTH DAILY BEFORE LARGE MEAL PRN 45 tablet 3  . guaiFENesin-dextromethorphan (ROBITUSSIN DM) 100-10 MG/5ML syrup Take 15 mLs by mouth every 4 (four) hours as needed for cough (cold symptoms). Reported on 08/17/2015    . ibuprofen (ADVIL,MOTRIN) 200 MG tablet Take 200 mg by mouth every 4 (four) hours as needed.    . Insulin Pen Needle (CAREFINE PEN NEEDLES) 32G X 4 MM MISC Use 1x a day 100 each 3  . ketoconazole (NIZORAL) 2 % cream Apply 1 application topically daily. 15 g 1  . LANTUS SOLOSTAR 100 UNIT/ML Solostar Pen INJECT 11 UNITS UNDER THE SKIN DAILY AT 10:00 PM 15 mL 4  . loperamide (IMODIUM A-D) 2 MG tablet Take 2 mg by mouth as needed for diarrhea or loose stools. Take 2 caplets  or 4 tsp (20 mls) liquid by mouth after first loose stool and 1 caplet or 1 tsp (5 mls) liquid by mouth for each subsequent stool as needed; not to exceed 8 caplets or 50 mls in 24 hours    . loratadine (CLARITIN) 10 MG tablet Take 1 tablet (10 mg total) by mouth daily. 90 tablet 0  . losartan (COZAAR) 25 MG tablet Take 1 tablet (25 mg total) by mouth daily. 31 tablet PRN  . metFORMIN (GLUCOPHAGE) 1000 MG tablet TAKE 1 TABLET BY MOUTH TWICE DAILY WITH A MEAL. 180 tablet 3  . montelukast (SINGULAIR) 10 MG tablet TAKE 1 TABLET BY MOUTH ONCE DAILY. 31 tablet PRN  . sitaGLIPtin (JANUVIA) 100 MG tablet TAKE 1 TABLET BY MOUTH ONCE DAILY BEFORE BREAKFAST. 90 tablet 3   No current facility-administered medications on file prior to visit.    Allergies  Allergen Reactions  . Shellfish Allergy Anaphylaxis  . Iohexol     IVP Dye    Family History  Problem Relation Age of Onset  . Alcohol abuse Father   . Cancer Maternal  Grandmother   . Hyperlipidemia Maternal Grandmother   . Heart disease Maternal Grandmother   . Hypertension Maternal Grandmother   . Heart disease Maternal Grandfather   . Hypertension Maternal Grandfather   . Hyperlipidemia Maternal Grandfather   . Hyperlipidemia Paternal Grandmother   . Heart disease Paternal Grandmother   . Hypertension Paternal Grandmother   . Kidney disease Paternal Grandmother   . Diabetes Paternal Grandmother   . Heart disease Paternal Grandfather   . Hypertension Paternal Grandfather   . Hyperlipidemia Paternal Grandfather   . Alcohol abuse Mother    Pt has FH of DM in father and Paternal GPs.  PE: BP 110/78 (BP Location: Left Arm, Patient Position: Sitting, Cuff Size: Normal)   Pulse 97   Ht 5' 11"  (1.803 m)   Wt  150 lb (68 kg)   SpO2 97%   BMI 20.92 kg/m  Wt Readings from Last 3 Encounters:  10/03/20 150 lb (68 kg)  11/02/19 143 lb (64.9 kg)  08/26/19 145 lb (65.8 kg)   Constitutional: thin, in NAD Eyes: PERRLA, EOMI, no exophthalmos ENT: moist mucous membranes, no thyromegaly, no cervical lymphadenopathy Cardiovascular: RRR, No MRG Respiratory: CTA B Gastrointestinal: abdomen soft, NT, ND, BS+ Musculoskeletal: no deformities, strength intact in all 4 Skin: moist, warm, no rashes Neurological: no tremor with outstretched hands, DTR normal in all 4  ASSESSMENT: 1. DM1.5, insulin-dependent, uncontrolled, without long term complications  We investigated him for type I DM and GAD Abs were positive >> Type 1.5 DM Component     Latest Ref Rng & Units 10/08/2016  Glucose     65 - 99 mg/dL 73  Pancreatic Islet Cell Antibody     <5 JDF Units <5  Glutamic Acid Decarb Ab     <5 IU/mL 14 (H)  C-Peptide     0.80 - 3.85 ng/mL 1.29   2. Low BMD for age  PLAN:  1. Patient with history of controlled type 1.5 diabetes, with improved control due to the coronavirus pandemic as he was not able to eat out.  HbA1c decreased to 6.1% at last visit.  At that  time, he did not have any significant hyperglycemic spikes or low blood sugars.  We did not change his regimen at that time.  He was starting to do his own injections and I congratulated him for this. -Since then, he moved into a new group home approximately a week ago. -At today's visit, the caregiver tells me that Shanon Brow was not getting the diabetic medications as prescribed at the previous facility, and they just started to give them to him consistently approximately a week ago.  They cannot give me information about his blood sugars and did not bring a log.  I strongly advised him to bring a log at next visit.  I also advised him to have him check his sugars twice a day, fasting and at bedtime and send these to me in a week. We checked his HbA1c: 9.2% (MUCH higher).  For now, I suggested to increase Lantus slightly, but will need to have more information about his blood sugars to suggest any other changes in his regimen. -they also need a note from me that he can inject his own insulin.  At last visit, his aunt was telling me that he is doing well with the injections. - I suggested to:  Patient Instructions  Please continue: - Metformin 1000 mg 2x a day with meals - Glipizide 5 mg  30 min before a large dinner  - Januvia 100 mg daily in am  Please increase: - Lantus 15 units at bedtime  Please check blood sugars in am and at bedtime.  Send me the sugar log in 1 week.  Call with sugars <70 or >250.  Please continue Fosamax 70 mg weekly.  Shanon Brow can inject his own insulin.  Please return in 1.5 months with your sugar log.   - advised to check sugars at different times of the day - 2x a day, rotating check times - advised for yearly eye exams >> he is not UTD - return to clinic in 6 months     2. Low BMD for age -No falls or fractures since last visit -He had a hip fracture while rollerskating in 2015.  At that time, a bone  density scan showed mildly low BMD for age.  We started him on  calcium and vitamin D 2000 units daily.  He continues on these now. -He had another bone density in 11/2019 and this showed significantly improved scores at the level of the spine and still low scores but in the normal range in the left distal radius. -investigation for secondary causes for osteoporosis was negative: No vitamin D deficiency, no kidney disease, thyroid disease, hypophosphatasia, anemia, celiac disease, multiple myeloma, hyperparathyroidism, 24 hydroxylase deficiency, calcium malabsorption. -Vitamin D level was 52, excellent, at last visit  -We will repeat a vitamin D level at next visit -We will repeat his DXA scan after 2 years from the previous  Aunt: [phone] -needs to be called with lab results Address: [address] [place], [place], [address]   Philemon Kingdom, MD PhD [facility] Endocrinology

## 2020-10-03 NOTE — Telephone Encounter (Signed)
Brandon from [facility] is wanting a call back regarding pt's needles and which one to use and when to use. States that he uses the Carefine pen needles at bedtime with his insulin. [name] is wanting to know about the other prescription for needles   Insulin Pen Needle (B-D UF III MINI PEN NEEDLES) 31G X 5 MM MISC  Insulin Pen Needle (CAREFINE PEN NEEDLES) 32G X 4 MM MISC   [phone]

## 2020-10-04 NOTE — Telephone Encounter (Signed)
All Rx's have been sent to new pharmacy.

## 2020-10-04 NOTE — Telephone Encounter (Signed)
Called and advised group home rep and guardian Ms Blanca Friend the information needed and will await follow up.

## 2020-10-09 DIAGNOSIS — E119 Type 2 diabetes mellitus without complications: Secondary | ICD-10-CM | POA: Diagnosis not present

## 2020-10-13 DIAGNOSIS — E109 Type 1 diabetes mellitus without complications: Secondary | ICD-10-CM | POA: Diagnosis not present

## 2020-10-13 DIAGNOSIS — M810 Age-related osteoporosis without current pathological fracture: Secondary | ICD-10-CM | POA: Diagnosis not present

## 2020-11-16 ENCOUNTER — Telehealth: Payer: Self-pay | Admitting: Family Medicine

## 2020-11-16 NOTE — Telephone Encounter (Signed)
Document faxed to our office to be filled out by provider (FL 2 form 2 pages) Document put at front office tray under providers name.

## 2020-11-17 NOTE — Telephone Encounter (Signed)
Hey, I didn't see the fax for the pt in the basket

## 2020-11-20 ENCOUNTER — Encounter: Payer: Self-pay | Admitting: Internal Medicine

## 2020-11-28 ENCOUNTER — Encounter: Payer: Self-pay | Admitting: Internal Medicine

## 2020-11-29 ENCOUNTER — Encounter: Payer: Self-pay | Admitting: Internal Medicine

## 2020-11-29 ENCOUNTER — Ambulatory Visit (INDEPENDENT_AMBULATORY_CARE_PROVIDER_SITE_OTHER): Payer: Medicare Other | Admitting: Internal Medicine

## 2020-11-29 ENCOUNTER — Other Ambulatory Visit: Payer: Self-pay

## 2020-11-29 VITALS — BP 122/90 | HR 90 | Ht 71.0 in | Wt 144.0 lb

## 2020-11-29 DIAGNOSIS — M8580 Other specified disorders of bone density and structure, unspecified site: Secondary | ICD-10-CM

## 2020-11-29 DIAGNOSIS — E139 Other specified diabetes mellitus without complications: Secondary | ICD-10-CM | POA: Diagnosis not present

## 2020-11-29 DIAGNOSIS — M859 Disorder of bone density and structure, unspecified: Secondary | ICD-10-CM

## 2020-11-29 LAB — POCT GLYCOSYLATED HEMOGLOBIN (HGB A1C): Hemoglobin A1C: 8.4 % — AB (ref 4.0–5.6)

## 2020-11-29 MED ORDER — LYUMJEV KWIKPEN 100 UNIT/ML ~~LOC~~ SOPN: PEN_INJECTOR | SUBCUTANEOUS | 3 refills | Status: DC

## 2020-11-29 NOTE — Progress Notes (Signed)
Patient ID: Adam Fitzpatrick, adult   DOB: 09/27/1980, 40 y.o.   MRN: [id number]   This visit occurred during the SARS-CoV-2 public health emergency.  Safety protocols were in place, including screening questions prior to the visit, additional usage of staff PPE, and extensive cleaning of exam room while observing appropriate contact time as indicated for disinfecting solutions.   HPI: Adam Fitzpatrick) is a 40 y.o. man, returning for f/u for DM1.5, dx as DM2 in 2011, insulin-dependent, uncontrolled, without long-term complications and low BMD for age.  He usually comes with his aunt, who offers most of the history, as patient is very quiet, and mostly nonverbal.  At today's visit, he is here with his caregiver from the new group home.  Last visit 2 months ago.  Interim history: Before last visit, he moved into a new facility.  He missed Januvia doses.  Sugars were much higher. He has no complaints at this visit.   However, per discussion with the caregiver, sugars are still very high.  DM 1.5:  Reviewed HbA1c levels: Lab Results  Component Value Date   HGBA1C 9.2 (A) 10/03/2020   HGBA1C 6.1 (A) 11/02/2019   HGBA1C 6.4 08/26/2019   HGBA1C 6.4 03/12/2019   HGBA1C 7.7 (A) 07/28/2018   HGBA1C 6.0 (A) 03/26/2018   HGBA1C 6.7 (A) 11/18/2017   HGBA1C 6.6 07/16/2017   HGBA1C 6.8 (H) 07/15/2017   HGBA1C 7.5 04/15/2017   He is on: - Metformin 1000 mg 2x a day, with meals >> 2000 mg with dinner >> 1000 mg 2x a day with meals - Glipizide 5 mg 30 minutes before a large dinner - Onglyza 5 mg daily in am >>  Januvia 100 mg daily in a.m. - Lantus 9 >> 11 >> 15 units at bedtime   Did not bring a blood sugar log at this time, with CBGs checked twice a day: - am: 85-150, 218, 265 >> 95-141 >> 92-129 >> 90-147, 305, 311 - 2h after b'fast: n/c >> 138, 145 >> n/c - before lunch: n/c >> 145 >> n/c - 2h after lunch: n/c >> 128 >> n/c - before dinner: n/c >> 138 >> n/c - 2h after dinner: 78-184,  269, 315, 335 >> 100-277 (ave 140) >> 112-182 >> 161, 251-332 - bedtime: n/c - nighttime: n/c Lowest sugar was 67 >> 78 >> 95 >> 92 >> 90; it is unclear at which level he has hypoglycemia awareness. Highest sugar was 285 >> 335 >> 277 >> 182 >> 332.  Glucometer: AccuChek Aviva  Pt's meals are: - Breakfast: oatmeal, eggs, bacon, sausage, pancakes (sugarfree syrup) >> now bacon + eggs or waffle with sausage; cereal: cheerios + 2% milk - Lunch: 2 PB sandwiches on wheat >> now prepacked whole meals >45 g cabs - Dinner: low carb >> now: same: 45-50g carbs - Snacks: 2 snack a day: 10 am and  pm: nabs, jello cup He lost a significant amount of weight before starting insulin: 25-30 lbs.  -No CKD, last BUN/creatinine:  10/13/2020: Glucose 246, BUN/creatinine 12/0.84, GFR 114 Lab Results  Component Value Date   BUN 8 08/26/2019   BUN 9 07/15/2017   CREATININE 0.75 08/26/2019   CREATININE 0.75 07/15/2017  On losartan. - + HL; last set of lipids: 10/13/2020: 173/103/51/103 Lab Results  Component Value Date   CHOL 145 08/26/2019   HDL 53.10 08/26/2019   LDLCALC 80 08/26/2019   TRIG 59.0 08/26/2019   CHOLHDL 3 08/26/2019  He is not on  a statin. - last eye exam was in 2022: No DR reportedly.  Prev. Dr. Nancee Liter in [place]. Now another Dr. Marland Kitchen + numbness and tingling in his feet.    B12 levels were normal: Lab Results  Component Value Date   VITAMINB12 458 03/26/2018   VITAMINB12 549 11/17/2015   Low BMD for age:  Reviewed history:  Reviewed the reports of available DXA scans: Z-score Lumbar spine (L1-L4) Femoral neck (FN) 33% distal radius UD radius  11/02/2019 ([facility]) -1.8 RFN: -2.2 LFN: n/a -0.9 n/a  08/13/2016 ([facility]) -2.6 RFN: -2.4 LFN: n/a -0.1 n/a  05/04/2014 -2.1 RFN: -2.2 +0.6 +0.1   He denies dizziness/vertigo/orthostasis.  He had a left hip fracture in 2018 while [place] [employer].  Vitamin D level was normal at last check: 10/13/2020: Vitamin D  32.6 Lab Results  Component Value Date   VD25OH 52.0 11/02/2019   VD25OH 82.12 03/26/2018   VD25OH 72.84 11/17/2015   Calcitriol level was normal: Component     Latest Ref Rng & Units 10/08/2016  Vitamin D 1, 25 (OH) Total     18 - 72 pg/mL 31  Vitamin D3 1, 25 (OH)     pg/mL reviewed and addended history: 31  Vitamin D2 1, 25 (OH)     pg/mL <8   He continues on calcium 600 mg twice a day and vitamin D 2000 units daily.  Restarting Fosamax 11/2016.  No side effects..  No weightbearing exercises.  She is not on vitamin A supplements  Pt does have a FH of osteoporosis: MGM.  No hypo or hypercalcemia or hyperparathyroidism.  No history of kidney stones. 10/13/2020: Calcium 9.9 03/12/2019: calcium 9.4 (8.4-10.2) Lab Results  Component Value Date   PTH 21 10/08/2016   CALCIUM 9.7 08/26/2019   CALCIUM 10.5 07/15/2017   CALCIUM 9.8 10/08/2016   CALCIUM 10.3 12/01/2015   CALCIUM 9.8 11/17/2015   24-hour urine calcium was normal: Component     Latest Ref Rng & Units 10/14/2016  Creatinine, Urine     20 - 370 mg/dL 121  Creatinine, 24H Ur     0.63 - 2.50 g/24 h 1.57  Calcium, Ur     Not estab mg/dL 23  Calcium, 24 hour urine     55 - 300 mg/24 h 299   No thyrotoxicosis.  Reviewed TSH levels: Lab Results  Component Value Date   TSH 1.13 08/26/2019   TSH 1.35 07/15/2017   TSH 1.26 08/13/2016   No CKD.  Reviewed BUN/creatinine levels: 10/13/2020: 12/0.84, GFR 114 Lab Results  Component Value Date   BUN 8 08/26/2019   BUN 9 07/15/2017   CREATININE 0.75 08/26/2019   CREATININE 0.75 07/15/2017   Multiple myeloma and celiac disease work-up were negative: Component     Latest Ref Rng & Units 10/08/2016  Protein Urine Random     Not Estab. mg/dL 19.8  Albumin ELP, Urine     % 40.3  Alpha-1-Globulin, U     % 2.1  ALPHA-2-GLOBULIN, U     % 15.1  Beta Globulin, U     % 26.9  Gamma Globulin, U     % 15.7  M Component, Ur     Not Observed % Not Observed  Please  Note:      Comment  Antigliadin Abs, IgA     0 - 19 units 2  Transglutaminase IgA     0 - 3 U/mL <2  IgA/Immunoglobulin A, Serum  90 - 386 mg/dL 140   ROS: Constitutional: no weight gain/no weight loss, no fatigue, no increased urination Eyes: no blurry vision ENT: no sore throat, no dysphagia Cardiovascular: no CP/no SOB/no palpitations/no leg swelling Respiratory: no cough/no SOB/no wheezing Gastrointestinal: no N/no V/no D/no C/no acid reflux Musculoskeletal: no muscle aches/no joint aches Skin: no rashes, no hair loss Neurological: no tremors//no dizziness  I reviewed pt's medications, allergies, PMH, social hx, family hx, and changes were documented in the history of present illness. Otherwise, unchanged from my initial visit note.  Past Medical History:  Diagnosis Date   Abdominal pain 12/01/2015   Asthma    Blood in urine    Diabetes mellitus    GERD (gastroesophageal reflux disease)    Hyperlipidemia    notes only hx of hyperlipidemia   Loss of weight 12/06/2015   MR (mental retardation), moderate    MRSA (methicillin resistant staph aureus) culture positive    Osteoporosis 08/18/2016   Routine general medical examination at a health care facility 10/15/2012    Past Surgical History:  Procedure Laterality Date   HIP SURGERY     Left   INTRAMEDULLARY (IM) NAIL INTERTROCHANTERIC Left 04/10/2014   INTRAMEDULLARY (IM) NAIL INTERTROCHANTERIC Left 04/10/2014   Procedure: INTRAMEDULLARY (IM) NAIL INTERTROCHANTRIC LEFT HIP;  Surgeon: Renette Butters, MD;  Location: [facility];  Service: Orthopedics;  Laterality: Left;    Social History   Socioeconomic History   Marital status: Single    Spouse name: Not on file   Number of children: Not on file   Years of education: Not on file   Highest education level: Not on file  Occupational History   Not on file  Tobacco Use   Smoking status: Never   Smokeless tobacco: Never  Substance and Sexual Activity   Alcohol use: No    Drug use: No   Sexual activity: Never  Other Topics Concern   Not on file  Social History Narrative   Not on file   Social Determinants of Health   Financial Resource Strain: Not on file  Food Insecurity: Not on file  Transportation Needs: Not on file  Physical Activity: Not on file  Stress: Not on file  Social Connections: Not on file  Intimate Partner Violence: Not on file    Current Outpatient Medications on File Prior to Visit  Medication Sig Dispense Refill   ACCU-CHEK AVIVA PLUS test strip USE TO CHECK BLOOD SUGAR TWICE DAILY 200 strip 3   Accu-Chek FastClix Lancets MISC CHECK BLOOD SUGARS ONCE DAILY 102 each 3   acetaminophen (TYLENOL) 500 MG tablet Take 500 mg by mouth every 4 (four) hours as needed (pain; fever >100).      albuterol (VENTOLIN HFA) 108 (90 Base) MCG/ACT inhaler Inhale 2 puffs into the lungs 4 (four) times daily as needed for wheezing or shortness of breath. Shortness of breath/wheezing 1 each 3   alendronate (FOSAMAX) 70 MG tablet TAKE 1 TABLET BY MOUTH EVERY 7 DAYS. TAKE WITH A FULL GLASS OF WATER ON AN EMPTY STOMACH. 12 tablet 3   Calcium Carbonate (CALCIUM 600 PO) Take by mouth 2 (two) times daily.     Cholecalciferol (VITAMIN D) 2000 units CAPS Take by mouth.     diphenhydrAMINE (BENADRYL) 25 MG tablet Take 25-50 mg by mouth every 6 (six) hours as needed (allergic reaction). Take 2 tablets (50 mg) for ingestion of shellfish and seek emergency help.     docusate sodium (COLACE) 100 MG capsule Take  1 capsule (100 mg total) by mouth 2 (two) times daily. Continue this while taking narcotics to help with bowel movements (Patient taking differently: Take 100 mg by mouth 2 (two) times daily. Continue this while taking narcotics to help with bowel movements  PRN) 30 capsule 1   famotidine (PEPCID) 40 MG tablet Take 1 tablet (40 mg total) by mouth at bedtime. 90 tablet 0   fluconazole (DIFLUCAN) 150 MG tablet 1 tab po daily x 3 days then repeat again in 1 week 6  tablet 1   fluticasone (FLONASE) 50 MCG/ACT nasal spray Place 1 spray into both nostrils daily as needed for allergies or rhinitis (PRN). 48 mL 0   glipiZIDE (GLUCOTROL) 5 MG tablet TAKE 1 TABLET (=TO 5MG) BY MOUTH DAILY BEFORE LARGE MEAL PRN 45 tablet 3   guaiFENesin-dextromethorphan (ROBITUSSIN DM) 100-10 MG/5ML syrup Take 15 mLs by mouth every 4 (four) hours as needed for cough (cold symptoms). Reported on 08/17/2015     ibuprofen (ADVIL,MOTRIN) 200 MG tablet Take 200 mg by mouth every 4 (four) hours as needed.     insulin glargine (LANTUS SOLOSTAR) 100 UNIT/ML Solostar Pen INJECT 15 UNITS UNDER THE SKIN DAILY AT 10:00 PM 15 mL 4   Insulin Pen Needle (CAREFINE PEN NEEDLES) 32G X 4 MM MISC Use 1x a day 100 each 3   ketoconazole (NIZORAL) 2 % cream Apply 1 application topically daily. 15 g 1   loperamide (IMODIUM A-D) 2 MG tablet Take 2 mg by mouth as needed for diarrhea or loose stools. Take 2 caplets  or 4 tsp (20 mls) liquid by mouth after first loose stool and 1 caplet or 1 tsp (5 mls) liquid by mouth for each subsequent stool as needed; not to exceed 8 caplets or 50 mls in 24 hours     loratadine (CLARITIN) 10 MG tablet Take 1 tablet (10 mg total) by mouth daily. 90 tablet 0   losartan (COZAAR) 25 MG tablet Take 1 tablet (25 mg total) by mouth daily. 31 tablet PRN   metFORMIN (GLUCOPHAGE) 1000 MG tablet TAKE 1 TABLET BY MOUTH TWICE DAILY WITH A MEAL. 180 tablet 3   montelukast (SINGULAIR) 10 MG tablet TAKE 1 TABLET BY MOUTH ONCE DAILY. 31 tablet PRN   sitaGLIPtin (JANUVIA) 100 MG tablet TAKE 1 TABLET BY MOUTH ONCE DAILY BEFORE BREAKFAST. 90 tablet 3   No current facility-administered medications on file prior to visit.    Allergies  Allergen Reactions   Shellfish Allergy Anaphylaxis   Iohexol     IVP Dye    Family History  Problem Relation Age of Onset   Alcohol abuse Father    Cancer Maternal Grandmother    Hyperlipidemia Maternal Grandmother    Heart disease Maternal Grandmother     Hypertension Maternal Grandmother    Heart disease Maternal Grandfather    Hypertension Maternal Grandfather    Hyperlipidemia Maternal Grandfather    Hyperlipidemia Paternal Grandmother    Heart disease Paternal Grandmother    Hypertension Paternal Grandmother    Kidney disease Paternal Grandmother    Diabetes Paternal Grandmother    Heart disease Paternal Grandfather    Hypertension Paternal Grandfather    Hyperlipidemia Paternal Grandfather    Alcohol abuse Mother    Pt has FH of DM in father and Paternal GPs.  PE: BP 122/90   Pulse 90   Ht _0  (1.803 m)   Wt 144 lb (65.3 kg)   SpO2 97%   BMI 20.08 kg/m  Wt Readings from Last 3 Encounters:  11/29/20 144 lb (65.3 kg)  10/03/20 150 lb (68 kg)  11/02/19 143 lb (64.9 kg)   Constitutional: thin, in NAD Eyes: PERRLA, EOMI, no exophthalmos ENT: moist mucous membranes, no thyromegaly, no cervical lymphadenopathy Cardiovascular: RRR, No MRG Respiratory: CTA B Gastrointestinal: abdomen soft, NT, ND, BS+ Musculoskeletal: no deformities, strength intact in all 4 Skin: moist, warm, no rashes Neurological: no tremor with outstretched hands, DTR normal in all 4  ASSESSMENT: 1. DM1.5, insulin-dependent, uncontrolled, without long term complications  We investigated him for type I DM and GAD Abs were positive >> Type 1.5 DM Component     Latest Ref Rng & Units 10/08/2016  Glucose     65 - 99 mg/dL 73  Pancreatic Islet Cell Antibody     <5 JDF Units <5  Glutamic Acid Decarb Ab     <5 IU/mL 14 (H)  C-Peptide     0.80 - 3.85 ng/mL 1.29   2. Low BMD for age  PLAN:  1. Patient with history of previously controlled type 1.5 diabetes, with improved control due to the coronavirus pandemic as he was not able to eat out.  HbA1c was 6.1% at that time.  However, afterwards, sugars started to worsen and an HbA1c obtained last visit was 9.2%, much higher.  Previous to our last visit, he moved to another group home and she was not  given Januvia.  He only started to take this consistently approximately 1 week prior.  At last visit, he did not bring a blood sugar log so it is difficult to discern CBG patterns or make drastic changes in his regimen.  At that time, I advised him to increase Lantus slightly but also advised the facility to send me his blood sugars in the week for further changes.  They did not do this.  I advised the facility at last visit to check his blood sugars in a.m. and at bedtime and to call me if they are lower than 70 or higher than 250.  I also advised him that he can inject his on his, per request from his caregivers. -His caregivers were interested in a CGM and I advised him to check with his insurance to see which one is covered.  They did not check on this yet. -At today's visit, per review of his blood sugar log from the facility, sugars are checked twice a day and they are much better in the morning, mostly at target, but with occasional spikes in the 300s.  Patient and caregiver cannot clarify what caused the spikes.  Upon questioning, he has different meals at the new facility.  Lunch and dinner are 45-50 g of carbs usually, while breakfast is high-protein high-fat most of the time.  He has 2 snacks, in a.m. and in the afternoon.  I would not necessarily recommend to stop these, since he lost some weight since last visit and is quite thin. -At today's visit, we discussed about continuing Lantus at the same dose, since he usually has a good response to it overnight, we will go ahead and stop glipizide, which he uses rarely anyway, and will add mealtime insulin with dinner.  It is impossible to decide whether he would need rapid acting insulin before breakfast and lunch, also, without more data.  Therefore, I asked the facility to also try to check sugars in the morning and before lunch and dinner.  The caregiver tells me that he is in a day  center, and not at the same facility during the day so this may not be  possible.  I am hoping that at least some of these values can be gathered to see if we need to cover breakfast and lunch, also. - I suggested to:  Patient Instructions  Please continue: - Metformin 1000 mg 2x a day with meals - Januvia 100 mg daily in am - Lantus 15 units at bedtime  Stop Glipizide.  Add: - Lyumjev 6 units before dinner. If sugars at bedtime are still >180, increase the dose to 8 units.  Please check blood sugars 4x a day: - am - before lunch - before dinner - at bedtime  Please send me the sugars in 2 weeks.  Call with sugars <70 or >250.  Please continue Fosamax 70 mg weekly.  Shanon Brow can inject his own insulin.  Please return in 2-3 months with your sugar log.   - we checked his HbA1c: 8.4% (better) - advised to check sugars at different times of the day - 4x a day, rotating check times - advised for yearly eye exams >> he is UTD - return to clinic in 2-3 months    2. Low BMD for age -No falls or fractures since last visit -She had a hip fracture while rollerskating in 2015.  At that time, a bone density scan showed mildly low BMD for age.  We started him on calcium and vitamin D 2000 units daily.  He continues on these now. -We reviewed together his latest bone density from 11/2019 and this showed significantly improved T-scores at the level of the spine and still low scores, but normal, in the left distal radius -Investigation for secondary causes for osteoporosis was negative: No vitamin D deficiency, no kidney disease, thyroid disease, hypophosphatasia, anemia, celiac disease, multiple myeloma, hyperparathyroidism, 24 hydroxylase deficiency, calcium malabsorption. -His latest vitamin D level checked on 10/13/2020 was normal, at 32.6, but lower than the ones obtained last year, which was 46 -For now, we will continue 2000 units vitamin D daily. -Also, continue Fosamax 70 mg weekly for now.  I plan to stop this after the next DXA scan, after a total of 5  years on the medication. -She needs another bone density scan in another year  Aunt: [phone] -needs to be called with lab results Address: [address] [place], [place], [address]  Philemon Kingdom, MD PhD [facility]

## 2020-11-29 NOTE — Patient Instructions (Addendum)
Please continue: - Metformin 1000 mg 2x a day with meals - Januvia 100 mg daily in am - Lantus 15 units at bedtime  Stop Glipizide.  Add: - Lyumjev 6 units before dinner. If sugars at bedtime are still >180, increase the dose to 8 units.  Please check blood sugars 4x a day: - am - before lunch - before dinner - at bedtime  Please send me the sugars in 2 weeks.  Call with sugars <70 or >250.  Please continue Fosamax 70 mg weekly.  Shanon Brow can inject his own insulin.  Please return in 2-3 months with your sugar log.

## 2020-12-01 ENCOUNTER — Telehealth: Payer: Self-pay | Admitting: Internal Medicine

## 2020-12-01 NOTE — Telephone Encounter (Signed)
Adam Fitzpatrick,  He has extremely high blood sugars after dinner. They can try to give him glipizide before every dinner and check the sugars at bedtime for the next 5 days and see how she does, but if they remain elevated, I would suggest to switch to Lyumjev before dinner. Ty! C

## 2020-12-01 NOTE — Telephone Encounter (Signed)
Please advise 

## 2020-12-01 NOTE — Telephone Encounter (Signed)
Called and spoke with sarah informed pt

## 2020-12-01 NOTE — Telephone Encounter (Signed)
Sarah with the group home patient is staying in, called stating she would like to give it 3 more months to see how patient is doing before changing his medication routine. So she thinks patient should continue to take the Glipizide, and to not start on the Lyumjev. Please advise.

## 2020-12-07 ENCOUNTER — Other Ambulatory Visit: Payer: Self-pay | Admitting: *Deleted

## 2020-12-07 MED ORDER — FAMOTIDINE 40 MG PO TABS: 40.0000 mg | ORAL_TABLET | Freq: Every day | ORAL | 0 refills | Status: AC

## 2020-12-12 ENCOUNTER — Other Ambulatory Visit: Payer: Self-pay | Admitting: Internal Medicine

## 2020-12-12 DIAGNOSIS — E139 Other specified diabetes mellitus without complications: Secondary | ICD-10-CM

## 2021-02-20 DIAGNOSIS — E109 Type 1 diabetes mellitus without complications: Secondary | ICD-10-CM | POA: Diagnosis not present

## 2021-03-02 ENCOUNTER — Ambulatory Visit: Payer: Self-pay | Admitting: Internal Medicine

## 2021-03-07 ENCOUNTER — Telehealth: Payer: Self-pay | Admitting: Internal Medicine

## 2021-03-07 ENCOUNTER — Other Ambulatory Visit: Payer: Self-pay | Admitting: Family Medicine

## 2021-03-07 DIAGNOSIS — E139 Other specified diabetes mellitus without complications: Secondary | ICD-10-CM

## 2021-03-07 MED ORDER — CAREFINE PEN NEEDLES 32G X 4 MM MISC
3 refills | Status: DC
Start: 2021-03-07 — End: 2021-10-12

## 2021-03-07 MED ORDER — LANTUS SOLOSTAR 100 UNIT/ML ~~LOC~~ SOPN: PEN_INJECTOR | SUBCUTANEOUS | 2 refills | Status: DC

## 2021-03-07 NOTE — Telephone Encounter (Signed)
MEDICATION: Lantus Solostar and Pen Needles  PHARMACY:  [facility] - [place], [place]  HAS THE PATIENT CONTACTED THEIR PHARMACY? yes    IS THIS A 90 DAY SUPPLY : YES  IS PATIENT OUT OF MEDICATION: YES  IF NOT; HOW MUCH IS LEFT: none  LAST APPOINTMENT DATE: @7 /05/2021  NEXT APPOINTMENT DATE:@10 /04/2021  DO WE HAVE YOUR PERMISSION TO LEAVE A DETAILED MESSAGE?: yes

## 2021-03-07 NOTE — Telephone Encounter (Signed)
Amie[facility] filled scripts

## 2021-03-13 ENCOUNTER — Ambulatory Visit (INDEPENDENT_AMBULATORY_CARE_PROVIDER_SITE_OTHER): Payer: Medicare Other | Admitting: Internal Medicine

## 2021-03-13 ENCOUNTER — Encounter: Payer: Self-pay | Admitting: Internal Medicine

## 2021-03-13 ENCOUNTER — Other Ambulatory Visit: Payer: Self-pay

## 2021-03-13 VITALS — BP 106/66 | HR 85 | Ht 71.0 in | Wt 141.4 lb

## 2021-03-13 DIAGNOSIS — M859 Disorder of bone density and structure, unspecified: Secondary | ICD-10-CM

## 2021-03-13 DIAGNOSIS — E139 Other specified diabetes mellitus without complications: Secondary | ICD-10-CM | POA: Diagnosis not present

## 2021-03-13 LAB — POCT GLYCOSYLATED HEMOGLOBIN (HGB A1C): Hemoglobin A1C: 8.1 % — AB (ref 4.0–5.6)

## 2021-03-13 MED ORDER — LYUMJEV KWIKPEN 100 UNIT/ML ~~LOC~~ SOPN: PEN_INJECTOR | SUBCUTANEOUS | 3 refills | Status: DC

## 2021-03-13 MED ORDER — LANTUS SOLOSTAR 100 UNIT/ML ~~LOC~~ SOPN: PEN_INJECTOR | SUBCUTANEOUS | 3 refills | Status: DC

## 2021-03-13 NOTE — Progress Notes (Signed)
Patient ID: Adam Fitzpatrick, adult   DOB: 29-May-1981, 40 y.o.   MRN: [id number]   This visit occurred during the SARS-CoV-2 public health emergency.  Safety protocols were in place, including screening questions prior to the visit, additional usage of staff PPE, and extensive cleaning of exam room while observing appropriate contact time as indicated for disinfecting solutions.   HPI: Adam Fitzpatrick) is a 40 y.o. man, returning for f/u for DM1.5, dx as DM2 in 2011, insulin-dependent, uncontrolled, without long-term complications and low BMD for age.  He usually comes with his aunt, who offers most of the history, as patient is very quiet, and mostly nonverbal.  At today's visit, he is here with his caregiver from the new group home.  Last visit 3 months ago.  Interim history: His sugars started to increase after he moved into a new facility last year. No blurry vision/disequilibrium/falls/fractures since last visit. + Dizziness. No increased urination, blurry vision, nausea, but had some chest discomfort with Glipizide.  DM 1.5:  Reviewed HbA1c levels: Lab Results  Component Value Date   HGBA1C 8.4 (A) 11/29/2020   HGBA1C 9.2 (A) 10/03/2020   HGBA1C 6.1 (A) 11/02/2019   HGBA1C 6.4 08/26/2019   HGBA1C 6.4 03/12/2019   HGBA1C 7.7 (A) 07/28/2018   HGBA1C 6.0 (A) 03/26/2018   HGBA1C 6.7 (A) 11/18/2017   HGBA1C 6.6 07/16/2017   HGBA1C 6.8 (H) 07/15/2017   He is on: - Metformin 1000 mg 2x a day, with meals >> 2000 mg with dinner >> 1000 mg 2x a day with meals - Glipizide 5 mg 30 minutes before a large dinner >> Lyumjev 6 units before dinner - Onglyza 5 mg daily in am >>  Januvia 100 mg daily in a.m. - Lantus 9 >> 11 >> 15 units at bedtime   His CBGs are checked 4 times a day at the facility - am: 85-150, 218, 265 >> 95-141 >> 92-129 >> 90-147, 305, 311 >> 77, 90-148, 174 - 2h after b'fast: n/c >> 138, 145 >> n/c - before lunch: n/c >> 145 >> n/c - 2h after lunch: n/c >> 128 >>  n/c - before dinner: n/c >> 138 >> n/c - 2h after dinner: 78-184, 269, 315, 335 >> 100-277 (ave 140) >> 112-182 >> 161, 251-332 - bedtime: n/c - nighttime: n/c Lowest sugar was 692 >> 90; it is unclear at which level he has hypoglycemia awareness. Highest sugar was 182 >> 332.  Glucometer: AccuChek Aviva  Pt's meals are: - Breakfast: oatmeal, eggs, bacon, sausage, pancakes (sugarfree syrup) >> now bacon + eggs or waffle with sausage; cereal: cheerios + 2% milk - Lunch: 2 PB sandwiches on wheat >> now prepacked whole meals >45 g cabs - Dinner: low carb >> now: same: 45-50g carbs - Snacks: 2 snack a day: 10 am and  pm: nabs, jello cup He lost a significant amount of weight before starting insulin: 25-30 lbs.  -No CKD, last BUN/creatinine:  10/13/2020: Glucose 246, BUN/creatinine 12/0.84, GFR 114 Lab Results  Component Value Date   BUN 8 08/26/2019   BUN 9 07/15/2017   CREATININE 0.75 08/26/2019   CREATININE 0.75 07/15/2017  On losartan.  - + HL; last set of lipids: 10/13/2020: 173/103/51/103 Lab Results  Component Value Date   CHOL 145 08/26/2019   HDL 53.10 08/26/2019   LDLCALC 80 08/26/2019   TRIG 59.0 08/26/2019   CHOLHDL 3 08/26/2019  He is not on a statin.  - last eye exam was in  2022: No DR reportedly.  Prev. Dr. Nancee Liter in [place]. Now another Dr.  Marland Kitchen + numbness and tingling in his feet.    B12 levels were normal: Lab Results  Component Value Date   VITAMINB12 458 03/26/2018   VITAMINB12 549 11/17/2015   Low BMD for age:  Reviewed history:  Reviewed the reports of available DXA scans: Z-score Lumbar spine (L1-L4) Femoral neck (FN) 33% distal radius UD radius  11/02/2019 ([facility]) -1.8 RFN: -2.2 LFN: n/a -0.9 n/a  08/13/2016 ([facility]) -2.6 RFN: -2.4 LFN: n/a -0.1 n/a  05/04/2014 -2.1 RFN: -2.2 +0.6 +0.1   He had a left hip fracture in 2018 while [place] [employer].  Vitamin D level was normal at last check: 10/13/2020: Vitamin D 32.6 Lab  Results  Component Value Date   VD25OH 52.0 11/02/2019   VD25OH 82.12 03/26/2018   VD25OH 72.84 11/17/2015   Calcitriol level was normal: Component     Latest Ref Rng & Units 10/08/2016  Vitamin D 1, 25 (OH) Total     18 - 72 pg/mL 31  Vitamin D3 1, 25 (OH)     pg/mL reviewed and addended history: 31  Vitamin D2 1, 25 (OH)     pg/mL <8   He continues on calcium 600 mg twice a day and vitamin D 2000 units daily.  Restarting Fosamax 11/2016.  No side effects..  No weightbearing exercises.  She is not on vitamin A supplements  Pt does have a FH of osteoporosis: MGM.  No hypo or hypercalcemia or hyperparathyroidism.  No history of kidney stones. 10/13/2020: Calcium 9.9 03/12/2019: calcium 9.4 (8.4-10.2) Lab Results  Component Value Date   PTH 21 10/08/2016   CALCIUM 9.7 08/26/2019   CALCIUM 10.5 07/15/2017   CALCIUM 9.8 10/08/2016   CALCIUM 10.3 12/01/2015   CALCIUM 9.8 11/17/2015   24-hour urine calcium was normal: Component     Latest Ref Rng & Units 10/14/2016  Creatinine, Urine     20 - 370 mg/dL 121  Creatinine, 24H Ur     0.63 - 2.50 g/24 h 1.57  Calcium, Ur     Not estab mg/dL 23  Calcium, 24 hour urine     55 - 300 mg/24 h 299   No thyrotoxicosis.  Reviewed TSH levels: Lab Results  Component Value Date   TSH 1.13 08/26/2019   TSH 1.35 07/15/2017   TSH 1.26 08/13/2016   No CKD.  Reviewed BUN/creatinine levels: 10/13/2020: 12/0.84, GFR 114 Lab Results  Component Value Date   BUN 8 08/26/2019   BUN 9 07/15/2017   CREATININE 0.75 08/26/2019   CREATININE 0.75 07/15/2017   Multiple myeloma and celiac disease work-up were negative: Component     Latest Ref Rng & Units 10/08/2016  Protein Urine Random     Not Estab. mg/dL 19.8  Albumin ELP, Urine     % 40.3  Alpha-1-Globulin, U     % 2.1  ALPHA-2-GLOBULIN, U     % 15.1  Beta Globulin, U     % 26.9  Gamma Globulin, U     % 15.7  M Component, Ur     Not Observed % Not Observed  Please Note:       Comment  Antigliadin Abs, IgA     0 - 19 units 2  Transglutaminase IgA     0 - 3 U/mL <2  IgA/Immunoglobulin A, Serum     90 - 386 mg/dL 140   ROS: + See HPI  I reviewed pt's medications, allergies, PMH, social hx, family hx, and changes were documented in the history of present illness. Otherwise, unchanged from my initial visit note.  Past Medical History:  Diagnosis Date   Abdominal pain 12/01/2015   Asthma    Blood in urine    Diabetes mellitus    GERD (gastroesophageal reflux disease)    Hyperlipidemia    notes only hx of hyperlipidemia   Loss of weight 12/06/2015   MR (mental retardation), moderate    MRSA (methicillin resistant staph aureus) culture positive    Osteoporosis 08/18/2016   Routine general medical examination at a health care facility 10/15/2012    Past Surgical History:  Procedure Laterality Date   HIP SURGERY     Left   INTRAMEDULLARY (IM) NAIL INTERTROCHANTERIC Left 04/10/2014   INTRAMEDULLARY (IM) NAIL INTERTROCHANTERIC Left 04/10/2014   Procedure: INTRAMEDULLARY (IM) NAIL INTERTROCHANTRIC LEFT HIP;  Surgeon: Renette Butters, MD;  Location: [facility];  Service: Orthopedics;  Laterality: Left;    Social History   Socioeconomic History   Marital status: Single    Spouse name: Not on file   Number of children: Not on file   Years of education: Not on file   Highest education level: Not on file  Occupational History   Not on file  Tobacco Use   Smoking status: Never   Smokeless tobacco: Never  Substance and Sexual Activity   Alcohol use: No   Drug use: No   Sexual activity: Never  Other Topics Concern   Not on file  Social History Narrative   Not on file   Social Determinants of Health   Financial Resource Strain: Not on file  Food Insecurity: Not on file  Transportation Needs: Not on file  Physical Activity: Not on file  Stress: Not on file  Social Connections: Not on file  Intimate Partner Violence: Not on file    Current Outpatient  Medications on File Prior to Visit  Medication Sig Dispense Refill   ACCU-CHEK AVIVA PLUS test strip CHECK BLOOD SUGAR TWICE DAILY 200 strip 3   Accu-Chek FastClix Lancets MISC CHECK BLOOD SUGARS ONCE DAILY 102 each 3   acetaminophen (TYLENOL) 500 MG tablet Take 500 mg by mouth every 4 (four) hours as needed (pain; fever >100).      albuterol (VENTOLIN HFA) 108 (90 Base) MCG/ACT inhaler Inhale 2 puffs into the lungs 4 (four) times daily as needed for wheezing or shortness of breath. Shortness of breath/wheezing 1 each 3   alendronate (FOSAMAX) 70 MG tablet TAKE 1 TABLET BY MOUTH EVERY 7 DAYS. TAKE WITH A FULL GLASS OF WATER ON AN EMPTY STOMACH. 12 tablet 3   Calcium Carbonate (CALCIUM 600 PO) Take by mouth 2 (two) times daily.     Cholecalciferol (VITAMIN D) 2000 units CAPS Take by mouth.     diphenhydrAMINE (BENADRYL) 25 MG tablet Take 25-50 mg by mouth every 6 (six) hours as needed (allergic reaction). Take 2 tablets (50 mg) for ingestion of shellfish and seek emergency help.     docusate sodium (COLACE) 100 MG capsule Take 1 capsule (100 mg total) by mouth 2 (two) times daily. Continue this while taking narcotics to help with bowel movements (Patient taking differently: Take 100 mg by mouth 2 (two) times daily. Continue this while taking narcotics to help with bowel movements  PRN) 30 capsule 1   famotidine (PEPCID) 40 MG tablet Take 1 tablet (40 mg total) by mouth at bedtime. 90 tablet 0  fluconazole (DIFLUCAN) 150 MG tablet 1 tab po daily x 3 days then repeat again in 1 week 6 tablet 1   fluticasone (FLONASE) 50 MCG/ACT nasal spray Place 1 spray into both nostrils daily as needed for allergies or rhinitis (PRN). 48 mL 0   glipiZIDE (GLUCOTROL) 5 MG tablet TAKE 1 TABLET (=TO 5MG) BY MOUTH DAILY BEFORE LARGE MEAL PRN 45 tablet 3   guaiFENesin-dextromethorphan (ROBITUSSIN DM) 100-10 MG/5ML syrup Take 15 mLs by mouth every 4 (four) hours as needed for cough (cold symptoms). Reported on 08/17/2015      ibuprofen (ADVIL,MOTRIN) 200 MG tablet Take 200 mg by mouth every 4 (four) hours as needed.     insulin glargine (LANTUS SOLOSTAR) 100 UNIT/ML Solostar Pen INJECT 15 UNITS UNDER THE SKIN DAILY AT 10:00 PM 15 mL 2   Insulin Lispro-aabc, 1 U Dial, (LYUMJEV KWIKPEN) 100 UNIT/ML SOPN Inject under skin 6-8 units before dinner 15 mL 3   Insulin Pen Needle (CAREFINE PEN NEEDLES) 32G X 4 MM MISC Use 1x a day 100 each 3   ketoconazole (NIZORAL) 2 % cream Apply 1 application topically daily. 15 g 1   loperamide (IMODIUM A-D) 2 MG tablet Take 2 mg by mouth as needed for diarrhea or loose stools. Take 2 caplets  or 4 tsp (20 mls) liquid by mouth after first loose stool and 1 caplet or 1 tsp (5 mls) liquid by mouth for each subsequent stool as needed; not to exceed 8 caplets or 50 mls in 24 hours     loratadine (CLARITIN) 10 MG tablet Take 1 tablet (10 mg total) by mouth daily. 90 tablet 0   losartan (COZAAR) 25 MG tablet Take 1 tablet (25 mg total) by mouth daily. 31 tablet PRN   metFORMIN (GLUCOPHAGE) 1000 MG tablet TAKE 1 TABLET BY MOUTH TWICE DAILY WITH A MEAL. 180 tablet 3   montelukast (SINGULAIR) 10 MG tablet TAKE 1 TABLET BY MOUTH ONCE DAILY. 31 tablet PRN   sitaGLIPtin (JANUVIA) 100 MG tablet TAKE 1 TABLET BY MOUTH ONCE DAILY BEFORE BREAKFAST. 90 tablet 3   No current facility-administered medications on file prior to visit.    Allergies  Allergen Reactions   Shellfish Allergy Anaphylaxis   Iohexol     IVP Dye    Family History  Problem Relation Age of Onset   Alcohol abuse Father    Cancer Maternal Grandmother    Hyperlipidemia Maternal Grandmother    Heart disease Maternal Grandmother    Hypertension Maternal Grandmother    Heart disease Maternal Grandfather    Hypertension Maternal Grandfather    Hyperlipidemia Maternal Grandfather    Hyperlipidemia Paternal Grandmother    Heart disease Paternal Grandmother    Hypertension Paternal Grandmother    Kidney disease Paternal  Grandmother    Diabetes Paternal Grandmother    Heart disease Paternal Grandfather    Hypertension Paternal Grandfather    Hyperlipidemia Paternal Grandfather    Alcohol abuse Mother    Pt has FH of DM in father and Paternal GPs.  PE: There were no vitals taken for this visit. Wt Readings from Last 3 Encounters:  11/29/20 144 lb (65.3 kg)  10/03/20 150 lb (68 kg)  11/02/19 143 lb (64.9 kg)   Constitutional: thin, in NAD Eyes: PERRLA, EOMI, no exophthalmos ENT: moist mucous membranes, no thyromegaly, no cervical lymphadenopathy Cardiovascular: RRR, No MRG Respiratory: CTA B Gastrointestinal: abdomen soft, NT, ND, BS+ Musculoskeletal: no deformities, strength intact in all 4 Skin: moist, warm, no rashes  Neurological: no tremor with outstretched hands, DTR normal in all 4  ASSESSMENT: 1. DM1.5, insulin-dependent, uncontrolled, without long term complications  We investigated him for type I DM and GAD Abs were positive >> Type 1.5 DM Component     Latest Ref Rng & Units 10/08/2016  Glucose     65 - 99 mg/dL 73  Pancreatic Islet Cell Antibody     <5 JDF Units <5  Glutamic Acid Decarb Ab     <5 IU/mL 14 (H)  C-Peptide     0.80 - 3.85 ng/mL 1.29   2. Low BMD for age  PLAN:  1. Patient with history of previously controlled type 1.5 diabetes, with improved control during the coronavirus pandemic as he was not able to eat out but worsening control lately.  HbA1c increased up to 9.2% but at last visit, it is improved to 8.4%.  At that time, sugars started to improve in the morning where they were mostly at target, but he had occasional spikes in the 300s.  Patient and caregivers suspected that this was due to changing his diet after he changed facilities.  They were telling me that lunch and dinner contained 45 to 50 g of carbs usually, while breakfast was a high-protein high-fat meal most of the time.  He had 2 snacks, in the morning and in the afternoon.  I did not recommend to stop  the snacks especially since he lost some weight before last visit and he was quite thin.  At that time we continued Lantus but I advised him to stop glipizide and start Lyumjev before dinner.  Since then, caregiver contacted me that they preferred that Shanon Brow still use glipizide for a period of time while they were working on his diet. -He was not able to obtain a CGM -At today's visit, the caregiver is telling me that they are not able to check his sugars 4 times a day since he is at the daycare facility during the day.  They bring a blood sugar log with sugars checked in the morning and at bedtime.  Sugars in the morning are close to goal, but they are very high at night, mostly in the 200s and 300s.  The caregiver was convinced that his glipizide is working for him and expressed his reticence regarding the idea of intensifying his regimen, but I explained that these are not good results.  Since sugars are higher throughout the day, I advised him to move Lantus to the morning and we will also stop glipizide and Tresiba we do plan to start Lyumjev before dinner.  It is difficult to know whether he needs Lyumjev with the other meals of the day since sugars cannot be checked during the day.  However, I think this is a good start. - I suggested to:  Patient Instructions  Please continue: - Metformin 1000 mg 2x a day with meals - Januvia 100 mg daily in am - Lantus 15 units in am   Stop Glipizide and start: - Lyumjev 6 units before dinner. If sugars at bedtime are still >180 at bedtime, increase the dose to 8 units.  Shanon Brow can inject his own insulin.  Please check blood sugars 2x a day: - am - at bedtime  Call with sugars <70 or >250.  Please continue Fosamax 70 mg weekly.  Please return in 3 months with your sugar log.   - we checked his HbA1c: 8.1% (lower) - advised to check sugars at different times of the  day - 2x a day, rotating check times - advised for yearly eye exams >> he is UTD -  return to clinic in 3 months    2. Low BMD for age -No falls or fractures since last visit -She had a hip fracture while rollerskating in 2015.  At that time, a bone density scan showed mildly low BMD for age.  We started him on calcium and vitamin D 2000 units daily.  He continues on this now. -We reviewed together the latest bone density scan from 11/2019 initial significantly improved T-scores at the level of the spine and still low scores, but in the normal range in the left distal radius -Investigation for secondary causes for osteoporosis was negative: No vitamin D deficiency, no kidney disease, thyroid disease, hypophosphatasia, anemia, celiac disease, multiple myeloma, hyperparathyroidism, 24 hydroxylase deficiency, calcium malabsorption. -Latest vitamin D level from 10/2020 was normal -For now, we will continue 2000 units vitamin D daily -He continues on Fosamax 70 mg weekly.  We discussed about stopping after the next DXA scan, after total of ~5 years on the medication -he needs another bone density scan in a year from the previous  Aunt: [phone] -needs to be called with lab results Address: [address] [place], [place], [address]  Philemon Kingdom, MD PhD [facility]

## 2021-03-13 NOTE — Patient Instructions (Addendum)
Please continue: - Metformin 1000 mg 2x a day with meals - Januvia 100 mg daily in am - Lantus 15 units in am   Stop Glipizide and start: - Lyumjev 6 units before dinner. If sugars at bedtime are still >180 at bedtime, increase the dose to 8 units.  Adam Fitzpatrick can inject his own insulin.  Please check blood sugars 2x a day: - am - at bedtime  Call with sugars <70 or >250.  Please continue Fosamax 70 mg weekly.  Please return in 3 months with your sugar log.

## 2021-03-16 ENCOUNTER — Telehealth: Payer: Self-pay | Admitting: Internal Medicine

## 2021-03-16 NOTE — Telephone Encounter (Signed)
Sarah, RN with [facility] requests to be called at ph# [phone] re: Needs clarification of an Order for Lispro

## 2021-03-19 NOTE — Telephone Encounter (Signed)
Leandra Kern RN patients nurse is calling back 2nd time to get clarification on Lispro order   Please call her at [phone]

## 2021-03-19 NOTE — Telephone Encounter (Signed)
A, Please let them know:  Start with 6 units of Lyumjev before dinner.  Check sugars at bedtime for the next 3 days.  If they are higher than 180, increase the dose to 8 units of Lyumjev before dinner from then on.  If they are lower than 180, continue with the same dose, 6 units.

## 2021-03-26 ENCOUNTER — Other Ambulatory Visit: Payer: Self-pay | Admitting: Internal Medicine

## 2021-06-14 ENCOUNTER — Ambulatory Visit (INDEPENDENT_AMBULATORY_CARE_PROVIDER_SITE_OTHER): Payer: Commercial Managed Care - HMO | Admitting: Internal Medicine

## 2021-06-14 ENCOUNTER — Encounter: Payer: Self-pay | Admitting: Internal Medicine

## 2021-06-14 ENCOUNTER — Other Ambulatory Visit: Payer: Self-pay

## 2021-06-14 VITALS — BP 110/80 | HR 105 | Ht 71.0 in | Wt 142.4 lb

## 2021-06-14 DIAGNOSIS — M859 Disorder of bone density and structure, unspecified: Secondary | ICD-10-CM | POA: Diagnosis not present

## 2021-06-14 DIAGNOSIS — E139 Other specified diabetes mellitus without complications: Secondary | ICD-10-CM | POA: Diagnosis not present

## 2021-06-14 NOTE — Progress Notes (Signed)
Patient ID: JKWON TREPTOW, adult   DOB: Feb 02, 1981, 41 y.o.   MRN: [id number]   This visit occurred during the SARS-CoV-2 public health emergency.  Safety protocols were in place, including screening questions prior to the visit, additional usage of staff PPE, and extensive cleaning of exam room while observing appropriate contact time as indicated for disinfecting solutions.   HPI: AMONTAE NG) is a 41 y.o. man, returning for f/u for DM1.5, dx as DM2 in 2011, insulin-dependent, uncontrolled, without long-term complications and low BMD for age.   He usually comes with his aunt, who offers most of the history, as patient is very quiet, and mostly nonverbal however, at the last 2 visits and again today, he came with his caregiver from his group home.  Last visit 3 months ago.  Interim history: His sugars started to increase after he moved into a new facility in 2021. N he denies o blurry vision/disequilibrium/falls/fractures since last visit.  No increased urination, blurry vision, nausea, chest pain.  DM 1.5:  Reviewed HbA1c levels: Lab Results  Component Value Date   HGBA1C 8.1 (A) 03/13/2021   HGBA1C 8.4 (A) 11/29/2020   HGBA1C 9.2 (A) 10/03/2020   HGBA1C 6.1 (A) 11/02/2019   HGBA1C 6.4 08/26/2019   HGBA1C 6.4 03/12/2019   HGBA1C 7.7 (A) 07/28/2018   HGBA1C 6.0 (A) 03/26/2018   HGBA1C 6.7 (A) 11/18/2017   HGBA1C 6.6 07/16/2017   He is on: - Metformin 1000 mg 2x a day, with meals >> 2000 mg with dinner >> 1000 mg 2x a day with meals - Glipizide 5 mg 30 minutes before a large dinner >> Lyumjev 6 units before dinner - Onglyza 5 mg daily in am >>  Januvia 100 mg daily in a.m. - Lantus 9 >> 11 >> 15 units at bedtime >> in am He had chest discomfort with glipizide.  His CBGs are checked 2 times a day at the facility - am: 90-147, 305, 311 >> 77, 90-148, 174 >> 61, 79-130 - 2h after b'fast: n/c >> 138, 145 >> n/c - before lunch: n/c >> 145 >> n/c - 2h after lunch: n/c >>  128 >> n/c - before dinner: n/c >> 138 >> n/c - 2h after dinner: 161, 251-332 >> 68, 70, 82-227, 290, 357 - bedtime: n/c - nighttime: n/c Lowest sugar was 692 >> 90 >> 61; it is unclear at which level he has hypoglycemia awareness. Highest sugar was 182 >> 332 >> 357.  Glucometer: AccuChek Aviva  Pt's meals are: - Breakfast:  cereal: cheerios + 2% milk >> bacon + eggs, 1-2 waffle; b'fast burritos - Lunch: 2 PB sandwiches on wheat >> now prepacked whole meals >45 g cabs >> soup, lunch bowl (up to 45g carbs) - Dinner: low carb >> now: same: 45-50g carbs >> home cooked: steak + mac and cheese or other starch, greens - Snacks: 2 snack a day: 10 am and  pm: nabs, sugar free jello cup, fruit He lost a significant amount of weight before starting insulin: 25-30 lbs.  -No CKD, last BUN/creatinine:  10/13/2020: Glucose 246, BUN/creatinine 12/0.84, GFR 114 Lab Results  Component Value Date   BUN 8 08/26/2019   BUN 9 07/15/2017   CREATININE 0.75 08/26/2019   CREATININE 0.75 07/15/2017  On losartan.  - + HL; last set of lipids: 10/13/2020: 173/103/51/103 Lab Results  Component Value Date   CHOL 145 08/26/2019   HDL 53.10 08/26/2019   LDLCALC 80 08/26/2019   TRIG 59.0 08/26/2019  CHOLHDL 3 08/26/2019  He is not on a statin.  - last eye exam was in 2022: No DR reportedly.  Prev. Dr. Nancee Liter in [place]. Now another Dr.  Marland Kitchen + numbness and tingling in his feet.    B12 levels were normal: Lab Results  Component Value Date   VITAMINB12 458 03/26/2018   VITAMINB12 549 11/17/2015   Low BMD for age:  Reviewed history:  Reviewed the reports of available DXA scans: Z-score Lumbar spine (L1-L4) Femoral neck (FN) 33% distal radius UD radius  11/02/2019 ([facility]) -1.8 RFN: -2.2 LFN: n/a -0.9 n/a  08/13/2016 ([facility]) -2.6 RFN: -2.4 LFN: n/a -0.1 n/a  05/04/2014 -2.1 RFN: -2.2 +0.6 +0.1   He had a left hip fracture in 2018 while [place] [employer].  Vitamin D level was  normal at last check: 10/13/2020: Vitamin D 32.6 Lab Results  Component Value Date   VD25OH 52.0 11/02/2019   VD25OH 82.12 03/26/2018   VD25OH 72.84 11/17/2015   Calcitriol level was normal: Component     Latest Ref Rng & Units 10/08/2016  Vitamin D 1, 25 (OH) Total     18 - 72 pg/mL 31  Vitamin D3 1, 25 (OH)     pg/mL reviewed and addended history: 31  Vitamin D2 1, 25 (OH)     pg/mL <8   He continues on calcium 600 mg twice a day and vitamin D 2000 units daily.  We started Fosamax 11/2016.  No side effects.  No weightbearing exercises.  She is not on vitamin A supplements  Pt does have a FH of osteoporosis: MGM.  No hypo or hypercalcemia or hyperparathyroidism.  No history of kidney stones. 10/13/2020: Calcium 9.9 03/12/2019: calcium 9.4 (8.4-10.2) Lab Results  Component Value Date   PTH 21 10/08/2016   CALCIUM 9.7 08/26/2019   CALCIUM 10.5 07/15/2017   CALCIUM 9.8 10/08/2016   CALCIUM 10.3 12/01/2015   CALCIUM 9.8 11/17/2015   24-hour urine calcium was normal: Component     Latest Ref Rng & Units 10/14/2016  Creatinine, Urine     20 - 370 mg/dL 121  Creatinine, 24H Ur     0.63 - 2.50 g/24 h 1.57  Calcium, Ur     Not estab mg/dL 23  Calcium, 24 hour urine     55 - 300 mg/24 h 299   No thyrotoxicosis.  Reviewed TSH levels: Lab Results  Component Value Date   TSH 1.13 08/26/2019   TSH 1.35 07/15/2017   TSH 1.26 08/13/2016   No CKD.  Reviewed BUN/creatinine levels: 10/13/2020: 12/0.84, GFR 114 Lab Results  Component Value Date   BUN 8 08/26/2019   BUN 9 07/15/2017   CREATININE 0.75 08/26/2019   CREATININE 0.75 07/15/2017   Multiple myeloma and celiac disease work-up were negative: Component     Latest Ref Rng & Units 10/08/2016  Protein Urine Random     Not Estab. mg/dL 19.8  Albumin ELP, Urine     % 40.3  Alpha-1-Globulin, U     % 2.1  ALPHA-2-GLOBULIN, U     % 15.1  Beta Globulin, U     % 26.9  Gamma Globulin, U     % 15.7  M Component, Ur      Not Observed % Not Observed  Please Note:      Comment  Antigliadin Abs, IgA     0 - 19 units 2  Transglutaminase IgA     0 - 3 U/mL <2  IgA/Immunoglobulin A, Serum     90 - 386 mg/dL 140   ROS: + See HPI  I reviewed pt's medications, allergies, PMH, social hx, family hx, and changes were documented in the history of present illness. Otherwise, unchanged from my initial visit note.  Past Medical History:  Diagnosis Date   Abdominal pain 12/01/2015   Asthma    Blood in urine    Diabetes mellitus    GERD (gastroesophageal reflux disease)    Hyperlipidemia    notes only hx of hyperlipidemia   Loss of weight 12/06/2015   MR (mental retardation), moderate    MRSA (methicillin resistant staph aureus) culture positive    Osteoporosis 08/18/2016   Routine general medical examination at a health care facility 10/15/2012    Past Surgical History:  Procedure Laterality Date   HIP SURGERY     Left   INTRAMEDULLARY (IM) NAIL INTERTROCHANTERIC Left 04/10/2014   INTRAMEDULLARY (IM) NAIL INTERTROCHANTERIC Left 04/10/2014   Procedure: INTRAMEDULLARY (IM) NAIL INTERTROCHANTRIC LEFT HIP;  Surgeon: Renette Butters, MD;  Location: [facility];  Service: Orthopedics;  Laterality: Left;    Social History   Socioeconomic History   Marital status: Single    Spouse name: Not on file   Number of children: Not on file   Years of education: Not on file   Highest education level: Not on file  Occupational History   Not on file  Tobacco Use   Smoking status: Never   Smokeless tobacco: Never  Substance and Sexual Activity   Alcohol use: No   Drug use: No   Sexual activity: Never  Other Topics Concern   Not on file  Social History Narrative   Not on file   Social Determinants of Health   Financial Resource Strain: Not on file  Food Insecurity: Not on file  Transportation Needs: Not on file  Physical Activity: Not on file  Stress: Not on file  Social Connections: Not on file  Intimate  Partner Violence: Not on file    Current Outpatient Medications on File Prior to Visit  Medication Sig Dispense Refill   ACCU-CHEK AVIVA PLUS test strip CHECK BLOOD SUGAR TWICE DAILY 200 strip 3   Accu-Chek FastClix Lancets MISC CHECK BLOOD SUGARS ONCE DAILY 102 each 3   acetaminophen (TYLENOL) 500 MG tablet Take 500 mg by mouth every 4 (four) hours as needed (pain; fever >100).      albuterol (VENTOLIN HFA) 108 (90 Base) MCG/ACT inhaler Inhale 2 puffs into the lungs 4 (four) times daily as needed for wheezing or shortness of breath. Shortness of breath/wheezing 1 each 3   alendronate (FOSAMAX) 70 MG tablet TAKE 1 TABLET BY MOUTH EVERY 7 DAYS. TAKE WITH A FULL GLASS OF WATER ON AN EMPTY STOMACH. 12 tablet 3   Calcium Carbonate (CALCIUM 600 PO) Take by mouth 2 (two) times daily.     Cholecalciferol (VITAMIN D) 2000 units CAPS Take by mouth.     diphenhydrAMINE (BENADRYL) 25 MG tablet Take 25-50 mg by mouth every 6 (six) hours as needed (allergic reaction). Take 2 tablets (50 mg) for ingestion of shellfish and seek emergency help.     docusate sodium (COLACE) 100 MG capsule Take 1 capsule (100 mg total) by mouth 2 (two) times daily. Continue this while taking narcotics to help with bowel movements (Patient taking differently: Take 100 mg by mouth 2 (two) times daily. Continue this while taking narcotics to help with bowel movements  PRN) 30 capsule 1  famotidine (PEPCID) 40 MG tablet Take 1 tablet (40 mg total) by mouth at bedtime. 90 tablet 0   fluconazole (DIFLUCAN) 150 MG tablet 1 tab po daily x 3 days then repeat again in 1 week 6 tablet 1   fluticasone (FLONASE) 50 MCG/ACT nasal spray Place 1 spray into both nostrils daily as needed for allergies or rhinitis (PRN). 48 mL 0   guaiFENesin-dextromethorphan (ROBITUSSIN DM) 100-10 MG/5ML syrup Take 15 mLs by mouth every 4 (four) hours as needed for cough (cold symptoms). Reported on 08/17/2015     ibuprofen (ADVIL,MOTRIN) 200 MG tablet Take 200 mg by  mouth every 4 (four) hours as needed.     insulin glargine (LANTUS SOLOSTAR) 100 UNIT/ML Solostar Pen INJECT 15 UNITS UNDER THE SKIN DAILY IN AM 30 mL 3   Insulin Lispro-aabc (LYUMJEV KWIKPEN) 100 UNIT/ML KwikPen Inject under skin 6-8 units before dinner 15 mL 3   Insulin Pen Needle (CAREFINE PEN NEEDLES) 32G X 4 MM MISC Use 1x a day 100 each 3   ketoconazole (NIZORAL) 2 % cream Apply 1 application topically daily. 15 g 1   loperamide (IMODIUM A-D) 2 MG tablet Take 2 mg by mouth as needed for diarrhea or loose stools. Take 2 caplets  or 4 tsp (20 mls) liquid by mouth after first loose stool and 1 caplet or 1 tsp (5 mls) liquid by mouth for each subsequent stool as needed; not to exceed 8 caplets or 50 mls in 24 hours     loratadine (CLARITIN) 10 MG tablet Take 1 tablet (10 mg total) by mouth daily. 90 tablet 0   losartan (COZAAR) 25 MG tablet Take 1 tablet (25 mg total) by mouth daily. 31 tablet PRN   metFORMIN (GLUCOPHAGE) 1000 MG tablet TAKE 1 TABLET BY MOUTH TWICE DAILY WITH A MEAL. 180 tablet 3   montelukast (SINGULAIR) 10 MG tablet TAKE 1 TABLET BY MOUTH ONCE DAILY. 31 tablet PRN   sitaGLIPtin (JANUVIA) 100 MG tablet TAKE 1 TABLET BY MOUTH ONCE DAILY BEFORE BREAKFAST. 90 tablet 3   No current facility-administered medications on file prior to visit.    Allergies  Allergen Reactions   Shellfish Allergy Anaphylaxis   Iohexol     IVP Dye    Family History  Problem Relation Age of Onset   Alcohol abuse Father    Cancer Maternal Grandmother    Hyperlipidemia Maternal Grandmother    Heart disease Maternal Grandmother    Hypertension Maternal Grandmother    Heart disease Maternal Grandfather    Hypertension Maternal Grandfather    Hyperlipidemia Maternal Grandfather    Hyperlipidemia Paternal Grandmother    Heart disease Paternal Grandmother    Hypertension Paternal Grandmother    Kidney disease Paternal Grandmother    Diabetes Paternal Grandmother    Heart disease Paternal  Grandfather    Hypertension Paternal Grandfather    Hyperlipidemia Paternal Grandfather    Alcohol abuse Mother    Pt has FH of DM in father and Paternal GPs.  PE: BP 110/80 (BP Location: Right Arm, Patient Position: Sitting, Cuff Size: Normal)    Pulse (!) 105    Ht _0  (1.803 m)    Wt 142 lb 6.4 oz (64.6 kg)    SpO2 97%    BMI 19.86 kg/m  Wt Readings from Last 3 Encounters:  06/14/21 142 lb 6.4 oz (64.6 kg)  03/13/21 141 lb 6.4 oz (64.1 kg)  11/29/20 144 lb (65.3 kg)   Constitutional: thin, in NAD, thoracic kyphosis  with poor posture Eyes: PERRLA, EOMI, no exophthalmos ENT: moist mucous membranes, no thyromegaly, no cervical lymphadenopathy Cardiovascular: tachycardia, RR, No MRG Respiratory: CTA B Musculoskeletal: no deformities, strength intact in all 4 Skin: moist, warm, no rashes Neurological: no tremor with outstretched hands, DTR normal in all 4  ASSESSMENT: 1. DM1.5, insulin-dependent, uncontrolled, without long term complications  We investigated him for type I DM and GAD Abs were positive >> Type 1.5 DM Component     Latest Ref Rng & Units 10/08/2016  Glucose     65 - 99 mg/dL 73  Pancreatic Islet Cell Antibody     <5 JDF Units <5  Glutamic Acid Decarb Ab     <5 IU/mL 14 (H)  C-Peptide     0.80 - 3.85 ng/mL 1.29   2. Low BMD for age  PLAN:  1. Patient with history of previously controlled type 1.5 diabetes, with improved control during the coronavirus pandemic as he was not able to eat out, but worsening control after he switched facilities.  HbA1c increased up to 9.2% and then improved, at last visit, being 8.1%.  At that time, per review of his log, sugars were very high at night, mostly in the 200s to 300s.  Caregiver was voicing the opinion that pt. Did not need Lyumjev before this meal, but I strongly recommended it (again -at the previous visit, I again suggested it but they declined, wanting to continue glipizide before dinner).  I advised them to change  the dose of Lyumjev based on the size of the meal and the postprandial blood sugars.  We moved his Lantus in the morning and stopped glipizide to avoid hypoglycemia. -At this visit, reviewing his blood sugars from the facility, they appear to be much improved compared to before, after he finally started Lyumjev.  He is is taking a fixed dose of Lyumjev before dinner, of 6 units, which helps, but is not quite enough.  Sugars after dinner are still occasionally high, up to the 200s and rarely 300s.  However, he now also has some better blood sugars, even down to 89.  For now, I discussed with the caregiver, for the patient to be given 8 units before a larger dinner, but otherwise, we can continue the current regimen. - I suggested to:  Patient Instructions  Please continue: - Metformin 1000 mg 2x a day with meals - Januvia 100 mg daily in am - Lantus 15 units in am  - Lyumjev 6 units before dinner (but 8 units before a larger dinner)  Shanon Brow can inject his own insulin.  Please check blood sugars 2x a day: - am - at bedtime  Call with sugars <70 or >250.  Please continue Fosamax 70 mg weekly.  Please return in 4 months with your sugar log.   - we checked his HbA1c: 7.4% (better) - advised to check sugars at different times of the day - 2x a day, rotating check times.  He could not obtain a CGM. - advised for yearly eye exams >> he is UTD - return to clinic in 3-4 months   2. Low BMD for age -No falls or fractures since last visit -She had a hip fracture while rollerskating in 2015.  At that time, a bone density scan showed mildly low BMD for age.  We started him on calcium and vitamin D 2000 units daily.  He continues on this now. -We also started Fosamax in 2018. -reviewed the latest bone density scan from 11/2019  with showing significantly improved T-scores at the level of the spine and nonsignificant improvement at the level of the right hip -We discussed about obtaining another bone  density later this year, after which we can make a decision whether to start Fosamax or not. -For now, we will continue vitamin D 2000 units daily.  Latest level was normal in 10/2020.  We will repeat this at next visit. -At this visit, I also recommended to do some form of exercise, since he is now completely inactive per caregivers report.  Address: [address] [place], [place], [address]  Philemon Kingdom, MD PhD [facility] [place] [facility]

## 2021-06-14 NOTE — Patient Instructions (Addendum)
Please continue: - Metformin 1000 mg 2x a day with meals - Januvia 100 mg daily in am - Lantus 15 units in am  - Lyumjev 6 units before dinner (but 8 units before a larger dinner)  Adam Fitzpatrick can inject his own insulin.  Please check blood sugars 2x a day: - am - at bedtime  Call with sugars <70 or >250.  Please continue Fosamax 70 mg weekly.  Please return in 4 months with your sugar log.

## 2021-06-15 LAB — POCT GLYCOSYLATED HEMOGLOBIN (HGB A1C): Hemoglobin A1C: 7.4 % — AB (ref 4.0–5.6)

## 2021-07-03 DIAGNOSIS — E109 Type 1 diabetes mellitus without complications: Secondary | ICD-10-CM | POA: Diagnosis not present

## 2021-08-10 ENCOUNTER — Other Ambulatory Visit: Payer: Self-pay | Admitting: Internal Medicine

## 2021-08-10 DIAGNOSIS — E139 Other specified diabetes mellitus without complications: Secondary | ICD-10-CM

## 2021-10-12 ENCOUNTER — Encounter: Payer: Self-pay | Admitting: Internal Medicine

## 2021-10-12 ENCOUNTER — Ambulatory Visit (INDEPENDENT_AMBULATORY_CARE_PROVIDER_SITE_OTHER): Payer: Medicare Other | Admitting: Internal Medicine

## 2021-10-12 VITALS — BP 110/68 | HR 95 | Ht 71.0 in | Wt 144.0 lb

## 2021-10-12 DIAGNOSIS — E139 Other specified diabetes mellitus without complications: Secondary | ICD-10-CM

## 2021-10-12 DIAGNOSIS — M859 Disorder of bone density and structure, unspecified: Secondary | ICD-10-CM

## 2021-10-12 LAB — POCT GLYCOSYLATED HEMOGLOBIN (HGB A1C): Hemoglobin A1C: 8.6 % — AB (ref 4.0–5.6)

## 2021-10-12 MED ORDER — LANTUS SOLOSTAR 100 UNIT/ML ~~LOC~~ SOPN: PEN_INJECTOR | SUBCUTANEOUS | 3 refills | Status: DC

## 2021-10-12 MED ORDER — METFORMIN HCL 1000 MG PO TABS: ORAL_TABLET | ORAL | 3 refills | Status: DC

## 2021-10-12 MED ORDER — LOSARTAN POTASSIUM 25 MG PO TABS: 25.0000 mg | ORAL_TABLET | Freq: Every day | ORAL | 3 refills | Status: DC

## 2021-10-12 MED ORDER — CAREFINE PEN NEEDLES 32G X 4 MM MISC: 3 refills | Status: DC

## 2021-10-12 MED ORDER — ACCU-CHEK FASTCLIX LANCETS MISC: 3 refills | Status: DC

## 2021-10-12 MED ORDER — LYUMJEV KWIKPEN 100 UNIT/ML ~~LOC~~ SOPN: PEN_INJECTOR | SUBCUTANEOUS | 3 refills | Status: DC

## 2021-10-12 MED ORDER — ACCU-CHEK AVIVA PLUS VI STRP: ORAL_STRIP | 3 refills | Status: DC

## 2021-10-12 MED ORDER — ALENDRONATE SODIUM 70 MG PO TABS: ORAL_TABLET | ORAL | 3 refills | Status: DC

## 2021-10-12 NOTE — Progress Notes (Signed)
Patient ID: Adam Fitzpatrick, adult   DOB: [birth year], 41 y.o.   MRN: [id number]  ? ?This visit occurred during the SARS-CoV-2 public health emergency.  Safety protocols were in place, including screening questions prior to the visit, additional usage of staff PPE, and extensive cleaning of exam room while observing appropriate contact time as indicated for disinfecting solutions.  ? ?HPI: ?Adam Fitzpatrick Adam Fitzpatrick) is a 41 y.o. man, returning for f/u for DM1.5, dx as DM2 in 2011, insulin-dependent, uncontrolled, without long-term complications and low BMD for age.   ?He usually comes with his aunt, who offers most of the history, as patient is very quiet, and mostly nonverbal however, at the last 2 visits and again today, he came with his caregiver from his group home.  Last visit 4 months ago. ? ?Interim history: ?He denies o blurry vision/disequilibrium/falls/fractures since last visit.  ?No increased urination, blurry vision, nausea, chest pain. ? ?DM 1.5: ? ?Reviewed HbA1c levels: ?Lab Results  ?Component Value Date  ? HGBA1C 7.4 (A) 06/15/2021  ? HGBA1C 8.1 (A) 03/13/2021  ? HGBA1C 8.4 (A) 11/29/2020  ? HGBA1C 9.2 (A) 10/03/2020  ? HGBA1C 6.1 (A) 11/02/2019  ? HGBA1C 6.4 08/26/2019  ? HGBA1C 6.4 03/12/2019  ? HGBA1C 7.7 (A) 07/28/2018  ? HGBA1C 6.0 (A) 03/26/2018  ? HGBA1C 6.7 (A) 11/18/2017  ? ?He is on: ?- Metformin 1000 mg 2x a day, with meals >> 2000 mg with dinner >> 1000 mg 2x a day with meals ?- Glipizide 5 mg 30 minutes before a large dinner >> Lyumjev 6 units before dinner >> 6 (pt was not given the higher dose) units before dinner ?- Onglyza 5 mg daily in am >>  Januvia 100 mg daily in a.m. ?- Lantus 9 >> 11 >> 15 units at bedtime >> in am ?He had chest discomfort with glipizide. ? ?His CBGs are checked 2 times a day at the facility ?- am: 90-147, 305, 311 >> 77, 90-148, 174 >> 61, 79-130 >> 125-199 ?- 2h after b'fast: n/c >> 138, 145 >> n/c ?- before lunch: n/c >> 145 >> n/c ?- 2h after lunch: n/c >>  128 >> n/c ?- before dinner: n/c >> 138 >> n/c ?- 2h after dinner: 161, 251-332 >> 68, 70, 82-227, 290, 357 >> 134, 153-302 ?- bedtime: n/c ?- nighttime: n/c ?Lowest sugar was 692 >> 90 >> 61 >> 94; it is unclear at which level he has hypoglycemia awareness. ?Highest sugar was 182 >> 332 >> 357 >> 302. ? ?Glucometer: AccuChek Aviva ? ?Pt's meals are: ?- Breakfast:  cereal: cheerios + 2% milk >> bacon + eggs, 1-2 waffle; b'fast burritos ?- Lunch: 2 PB sandwiches on wheat >> now prepacked whole meals >45 g cabs >> soup, lunch bowl (up to 45g carbs) ?- Dinner: low carb >> now: same: 45-50g carbs >> home cooked: steak + mac and cheese or other starch, greens ?- Snacks: 2 snack a day: 10 am and  pm: nabs, sugar free jello cup, fruit ?He lost a significant amount of weight before starting insulin: 25-30 lbs. ? ?-No CKD, last BUN/creatinine:  ?10/13/2020: Glucose 246, BUN/creatinine 12/0.84, GFR 114 ?Lab Results  ?Component Value Date  ? BUN 8 08/26/2019  ? BUN 9 07/15/2017  ? CREATININE 0.75 08/26/2019  ? CREATININE 0.75 07/15/2017  ? ? Ref Range & Units 21 2020 Comments  ?Creatinine, Urine Not Estab. mg/dL 90.5    ?Albumin, Urine Not Estab. ug/mL 4.7    ?Alb/Creat Ratio 0 -  29 mg/g creat 5    ?On losartan. ? ?- + HL; last set of lipids: ?10/13/2020: 173/103/51/103 ?Lab Results  ?Component Value Date  ? CHOL 145 08/26/2019  ? HDL 53.10 08/26/2019  ? [place] 80 08/26/2019  ? TRIG 59.0 08/26/2019  ? CHOLHDL 3 08/26/2019  ?He is not on a statin. ? ?- last eye exam was in 2022: No DR reportedly.  Prev. [place]e [place] in [place]. Now another Dr. ? ?- + numbness and tingling in his feet.  Last foot exam 07/03/2021 by PCP: Reviewed note:Diabetic foot exam:  ?Left: Monofilament test: Sensation normal ?Pulses: present ?Skin: Normal and no erythema, no cyanosis or pallor  ?Other findings: none ?Right: Monofilament test: Sensation normal ?Pulses: present ?Skin: Normal and no erythema, no cyanosis or pallor  ?Other findings: none ?Exam  performed with shoes and socks removed. ? ?B12 levels were normal: ?Lab Results  ?Component Value Date  ? [id number] 458 03/26/2018  ? [id number] 549 11/17/2015  ? ?Low BMD for age: ? ?Reviewed history: ? ?Reviewed the reports of available DXA scans: ? ?Z-score Lumbar spine (L1-L4) Femoral neck (FN) 33% distal radius UD radius  ?11/02/2019 ([facility]) -1.8 RFN: -2.2 ?LFN: n/a -0.9 n/a  ?08/13/2016 ([facility]) -2.6 RFN: -2.4 ?LFN: n/a -0.1 n/a  ?05/04/2014 -2.1 RFN: -2.2 +0.6 +0.1  ? ?He had a left hip fracture in 2018 while [place] [employer]. ? ?Vitamin D level was normal at last check: ?10/13/2020: Vitamin D 32.6 ?Lab Results  ?Component Value Date  ? VD25OH 52.0 11/02/2019  ? VD25OH 82.12 03/26/2018  ? VD25OH 72.84 11/17/2015  ? ?Calcitriol level was normal: ?Component ?    Latest Ref Rng & Units 10/08/2016  ?Vitamin D 1, 25 (OH) Total ?    18 - 72 pg/mL 31  ?Vitamin D3 1, 25 (OH) ?    pg/mL reviewed and addended history: 31  ?Vitamin D2 1, 25 (OH) ?    pg/mL <8  ? ?He continues on calcium 600 mg twice a day and vitamin D 2000 units daily. ? ?We started Fosamax 11/2016.  No side effects. ? ?No weightbearing exercises. ? ?She is not on vitamin A supplements ? ?Pt does have a FH of osteoporosis: MGM. ? ?No hypo or hypercalcemia or hyperparathyroidism.  No history of kidney stones. ?10/13/2020: Calcium 9.9 ?03/12/2019: calcium 9.4 (8.4-10.2) ?Lab Results  ?Component Value Date  ? PTH 21 10/08/2016  ? CALCIUM 9.7 08/26/2019  ? CALCIUM 10.5 07/15/2017  ? CALCIUM 9.8 10/08/2016  ? CALCIUM 10.3 12/01/2015  ? CALCIUM 9.8 11/17/2015  ? ?24-hour urine calcium was normal: ?Component ?    Latest Ref Rng & Units 10/14/2016  ?Creatinine, Urine ?    20 - 370 mg/dL 121  ?Creatinine, 24H Ur ?    0.63 - 2.50 g/24 h 1.57  ?Calcium, Ur ?    Not estab mg/dL 23  ?Calcium, 24 hour urine ?    55 - 300 mg/24 h 299  ? ?No thyrotoxicosis.  Reviewed TSH levels: ?Lab Results  ?Component Value Date  ? TSH 1.13 08/26/2019  ? TSH 1.35  07/15/2017  ? TSH 1.26 08/13/2016  ? ?No CKD.  Reviewed BUN/creatinine levels: ?10/13/2020: 12/0.84, GFR 114 ?Lab Results  ?Component Value Date  ? BUN 8 08/26/2019  ? BUN 9 07/15/2017  ? CREATININE 0.75 08/26/2019  ? CREATININE 0.75 07/15/2017  ? ?Multiple myeloma and celiac disease work-up were negative: ?Component ?    Latest Ref Rng & Units 10/08/2016  ?Protein Urine  Random ?    Not Estab. mg/dL 19.8  ?Albumin ELP, Urine ?    % 40.3  ?Alpha-1-Globulin, U ?    % 2.1  ?ALPHA-2-GLOBULIN, U ?    % 15.1  ?Beta Globulin, U ?    % 26.9  ?Gamma Globulin, U ?    % 15.7  ?M Component, Ur ?    Not Observed % Not Observed  ?Please Note: ?     Comment  ?Antigliadin Abs, IgA ?    0 - 19 units 2  ?Transglutaminase IgA ?    0 - 3 U/mL <2  ?IgA/Immunoglobulin A, Serum ?    90 - 386 mg/dL 140  ? ?ROS: ?+ See HPI ? ?I reviewed pt's medications, allergies, PMH, social hx, family hx, and changes were documented in the history of present illness. Otherwise, unchanged from my initial visit note. ? ?Past Medical History:  ?Diagnosis Date  ? Abdominal pain 12/01/2015  ? Asthma   ? Blood in urine   ? Diabetes mellitus   ? GERD (gastroesophageal reflux disease)   ? Hyperlipidemia   ? notes only hx of hyperlipidemia  ? Loss of weight 12/06/2015  ? MR (mental retardation), moderate   ? MRSA (methicillin resistant staph aureus) culture positive   ? Osteoporosis 08/18/2016  ? Routine general medical examination at a health care facility 10/15/2012  ? ? ?Past Surgical History:  ?Procedure Laterality Date  ? HIP SURGERY    ? Left  ? INTRAMEDULLARY (IM) NAIL INTERTROCHANTERIC Left 04/10/2014  ? INTRAMEDULLARY (IM) NAIL INTERTROCHANTERIC Left 04/10/2014  ? Procedure: INTRAMEDULLARY (IM) NAIL INTERTROCHANTRIC LEFT HIP;  Surgeon: Renette Butters, MD;  Location: [facility];  Service: Orthopedics;  Laterality: Left;  ? ? ?Social History  ? ?Socioeconomic History  ? Marital status: Single  ?  Spouse name: Not on file  ? Number of children: Not on file  ? Years of  education: Not on file  ? Highest education level: Not on file  ?Occupational History  ? Not on file  ?Tobacco Use  ? Smoking status: Never  ? Smokeless tobacco: Never  ?Substance and Sexual Activity  ? [place] Levels

## 2021-10-12 NOTE — Patient Instructions (Addendum)
Please start checking sugars 3x a day: ?- am ?- before dinner ?- ~2h after dinner ? ?Please continue: ?- Metformin 1000 mg 2x a day with meals ?- Januvia 100 mg daily in am ?- Lantus 15 units in am  ? ?Increase ?- Lyumjev 10 units before dinner  ? ?Adam Fitzpatrick can inject his own insulin. ? ?Call with sugars <70 or >250. ? ?Please continue Fosamax 70 mg weekly. ? ?Try to schedule a new bone density.  ? ?Please return in 4 months with your sugar log.  ?

## 2022-02-06 DIAGNOSIS — Z23 Encounter for immunization: Secondary | ICD-10-CM | POA: Diagnosis not present

## 2022-02-06 DIAGNOSIS — Z0000 Encounter for general adult medical examination without abnormal findings: Secondary | ICD-10-CM | POA: Diagnosis not present

## 2022-02-06 DIAGNOSIS — M810 Age-related osteoporosis without current pathological fracture: Secondary | ICD-10-CM | POA: Diagnosis not present

## 2022-02-06 DIAGNOSIS — E1065 Type 1 diabetes mellitus with hyperglycemia: Secondary | ICD-10-CM | POA: Diagnosis not present

## 2022-02-06 DIAGNOSIS — R300 Dysuria: Secondary | ICD-10-CM | POA: Diagnosis not present

## 2022-02-12 ENCOUNTER — Encounter: Payer: Self-pay | Admitting: Internal Medicine

## 2022-02-14 ENCOUNTER — Other Ambulatory Visit: Payer: Self-pay

## 2022-02-14 DIAGNOSIS — E139 Other specified diabetes mellitus without complications: Secondary | ICD-10-CM

## 2022-02-14 MED ORDER — METFORMIN HCL 1000 MG PO TABS: ORAL_TABLET | ORAL | 3 refills | Status: DC

## 2022-02-14 NOTE — Progress Notes (Signed)
[facility])                                            [place] Team                                        Statin Quality Measure Assessment    02/14/2022  Adam Fitzpatrick 1980/12/10 [id number]  Per review of chart and payor information, this patient has been flagged for non-adherence to the following CMS Quality Measure:   '[x]'$  Statin Use in Persons with Diabetes  '[]'$  Statin Use in Persons with Cardiovascular Disease  The 10-year ASCVD risk score (Arnett DK, et al., 2019) is: 0.7%   Values used to calculate the score:     Age: 41 years     Sex: Male     Is Non-Hispanic African American: No     Diabetic: Yes     Tobacco smoker: No     Systolic Blood Pressure: 034 mmHg     Is BP treated: Yes     HDL Cholesterol: 62 mg/dL     Total Cholesterol: 159 mg/dL  Last LDL 80 mg/dL (as of September 2023) per chart review. Labs updated for statin assessment. Diabetic patient and not on statin.    Please consider ONE of the following recommendations:   Initiate high intensity statin Atorvastatin '40mg'$  once daily, #90, 3 refills   Rosuvastatin '20mg'$  once daily, #90, 3 refills    Initiate moderate intensity          statin with reduced frequency if prior          statin intolerance 1x weekly, #13, 3 refills   2x weekly, #26, 3 refills   3x weekly, #39, 3 refills   Code for past statin intolerance or other exclusions (required annually)  Drug Induced Myopathy G72.0   Myositis, unspecified M60.9   Rhabdomyolysis M62.82   Cirrhosis of liver K74.69   Biliary cirrhosis, unspecified K74.5   Abnormal blood glucose - for SUPD ONLY R73.09   Thank you for your time,  Kristeen Miss, [place] Cell: [phone]

## 2022-02-15 ENCOUNTER — Ambulatory Visit (INDEPENDENT_AMBULATORY_CARE_PROVIDER_SITE_OTHER): Payer: Medicare Other | Admitting: Internal Medicine

## 2022-02-15 ENCOUNTER — Encounter: Payer: Self-pay | Admitting: Internal Medicine

## 2022-02-15 VITALS — BP 118/74 | HR 89 | Ht 71.0 in | Wt 143.2 lb

## 2022-02-15 DIAGNOSIS — E139 Other specified diabetes mellitus without complications: Secondary | ICD-10-CM

## 2022-02-15 DIAGNOSIS — M859 Disorder of bone density and structure, unspecified: Secondary | ICD-10-CM | POA: Diagnosis not present

## 2022-02-15 LAB — POCT GLYCOSYLATED HEMOGLOBIN (HGB A1C): Hemoglobin A1C: 7.3 % — AB (ref 4.0–5.6)

## 2022-02-15 MED ORDER — LYUMJEV KWIKPEN 100 UNIT/ML ~~LOC~~ SOPN: PEN_INJECTOR | SUBCUTANEOUS | 3 refills | Status: DC

## 2022-02-15 NOTE — Progress Notes (Signed)
Patient ID: Adam Fitzpatrick, adult   DOB: 10/16/1980, 41 y.o.   MRN: [id number]   HPI: Adam Fitzpatrick) is a 41 y.o. man, returning for f/u for DM1.5, dx as DM2 in 2011, insulin-dependent, uncontrolled, without long-term complications and low BMD for age.   He usually comes with his aunt, who offers most of the history, as patient is very quiet, and mostly nonverbal however, he is here with his caregiver from his group home Adam Fitzpatrick).  Last visit 4 months ago.  Interim history: He denies o blurry vision/disequilibrium/falls/fractures since last visit.  No increased urination, blurry vision, nausea, chest pain. He tells me he feels much better at this visit, compared to last visit.  DM 1.5:  Reviewed HbA1c levels: 02/06/2022: HbA1c 7.6% Lab Results  Component Value Date   HGBA1C 8.6 (A) 10/12/2021   HGBA1C 7.4 (A) 06/15/2021   HGBA1C 8.1 (A) 03/13/2021   HGBA1C 8.4 (A) 11/29/2020   HGBA1C 9.2 (A) 10/03/2020   HGBA1C 6.1 (A) 11/02/2019   HGBA1C 6.4 08/26/2019   HGBA1C 6.4 03/12/2019   HGBA1C 7.7 (A) 07/28/2018   HGBA1C 6.0 (A) 03/26/2018   He is on: - Metformin 1000 mg 2x a day, with meals >> 2000 mg with dinner >> 1000 mg 2x a day with meals - Glipizide 5 mg 30 minutes before a large dinner >> Lyumjev 6 units before dinner >> 6 (pt was not given the higher dose) >> 10 units before dinner - Onglyza 5 mg daily in am >>  Januvia 100 mg daily in a.m. - Lantus 9 >> 11 >> 15 units at bedtime >> in am He had chest discomfort with glipizide.  His CBGs are checked 2-3x a day at the facility: - am: 77, 90-148, 174 >> 61, 79-130 >> 125-199 >> 77-127, 141 - 2h after b'fast: n/c >> 138, 145 >> n/c - before lunch: n/c >> 145 >> n/c - 2h after lunch: n/c >> 128 >> n/c - before dinner: n/c >> 138 >> n/c - 2h after dinner:68, 70, 82-227, 290, 357 >> 134, 153-302 >> 55, 61, 85-302 - bedtime: n/c - nighttime: n/c Lowest sugar was 692 >> 90 >> 61 >> 94; it is unclear at which level he has  hypoglycemia awareness. Highest sugar was 182 >> 332 >> 357 >> 302.  Glucometer: AccuChek Aviva  Pt's meals are: - Breakfast:  cereal: cheerios + 2% milk >> bacon + eggs, 1-2 waffle; b'fast burritos - Lunch: 2 PB sandwiches on wheat >> now prepacked whole meals >45 g cabs >> soup, lunch bowl (up to 45g carbs) - Dinner: low carb >> now: same: 45-50g carbs >> home cooked: steak + mac and cheese or other starch, greens - Snacks: 2 snack a day: 10 am and  pm: nabs, sugar free jello cup, fruit He lost a significant amount of weight before starting insulin: 25-30 lbs.  -No CKD, last BUN/creatinine:  02/06/2022: 14/0.85, GFR 113; glucose 256; ACR 4; Urine Glu 500 10/13/2020: Glucose 246, BUN/creatinine 12/0.84, GFR 114 Lab Results  Component Value Date   BUN 8 08/26/2019   BUN 9 07/15/2017   CREATININE 0.75 08/26/2019   CREATININE 0.75 07/15/2017  On losartan.  - + HL; last set of lipids: 02/06/2022: 159/93/62/80 10/13/2020: 173/103/51/103 Lab Results  Component Value Date   CHOL 145 08/26/2019   HDL 53.10 08/26/2019   LDLCALC 80 08/26/2019   TRIG 59.0 08/26/2019   CHOLHDL 3 08/26/2019  He is not on a statin.  -  last eye exam was in 2022: No DR reportedly.  Prev. Dr. Nancee Fitzpatrick in [place]. Now another Dr.  Marland Kitchen + numbness and tingling in his feet.  Last foot exam 07/03/2021 by PCP: Reviewed note: Left: Monofilament test: Sensation normal Pulses: present Skin: Normal and no erythema, no cyanosis or pallor  Other findings: none Right: Monofilament test: Sensation normal Pulses: present Skin: Normal and no erythema, no cyanosis or pallor  Other findings: none Exam performed with shoes and socks removed.  B12 levels were normal: Lab Results  Component Value Date   VITAMINB12 458 03/26/2018   VITAMINB12 549 11/17/2015   Low BMD for age:  Reviewed history:  Reviewed the reports of available DXA scans:  Z-score Lumbar spine (L1-L4) Femoral neck (FN) 33% distal radius UD radius   11/02/2019 ([facility]) -1.8 RFN: -2.2 LFN: n/a -0.9 n/a  08/13/2016 ([facility]) -2.6 RFN: -2.4 LFN: n/a -0.1 n/a  05/04/2014 -2.1 RFN: -2.2 +0.6 +0.1   He had a left hip fracture in 2018 while [place] [employer].  Vitamin D level was normal at last check: 10/13/2020: Vitamin D 32.6 Lab Results  Component Value Date   VD25OH 52.0 11/02/2019   VD25OH 82.12 03/26/2018   VD25OH 72.84 11/17/2015   Calcitriol level was normal: Component     Latest Ref Rng & Units 10/08/2016  Vitamin D 1, 25 (OH) Total     18 - 72 pg/mL 31  Vitamin D3 1, 25 (OH)     pg/mL reviewed and addended history: 31  Vitamin D2 1, 25 (OH)     pg/mL <8   He continues on calcium 600 mg twice a day and vitamin D 2000 units daily.  We started Fosamax 11/2016.  No side effects.  No weightbearing exercises.  She is not on vitamin A supplements  Pt does have a FH of osteoporosis: MGM.  No hypo or hypercalcemia or hyperparathyroidism.  No history of kidney stones. 02/06/2022: calcium 9.9 (8.7-10.2) 10/13/2020: Calcium 9.9 03/12/2019: calcium 9.4 (8.4-10.2) Lab Results  Component Value Date   PTH 21 10/08/2016   CALCIUM 9.7 08/26/2019   CALCIUM 10.5 07/15/2017   CALCIUM 9.8 10/08/2016   CALCIUM 10.3 12/01/2015   CALCIUM 9.8 11/17/2015   24-hour urine calcium was normal: Component     Latest Ref Rng & Units 10/14/2016  Creatinine, Urine     20 - 370 mg/dL 121  Creatinine, 24H Ur     0.63 - 2.50 g/24 h 1.57  Calcium, Ur     Not estab mg/dL 23  Calcium, 24 hour urine     55 - 300 mg/24 h 299   No thyrotoxicosis.  Reviewed TSH levels: 02/06/2022: TSH 1.380 Lab Results  Component Value Date   TSH 1.13 08/26/2019   TSH 1.35 07/15/2017   TSH 1.26 08/13/2016   Multiple myeloma and celiac disease work-up were negative: Component     Latest Ref Rng & Units 10/08/2016  Protein Urine Random     Not Estab. mg/dL 19.8  Albumin ELP, Urine     % 40.3  Alpha-1-Globulin, U     % 2.1   ALPHA-2-GLOBULIN, U     % 15.1  Beta Globulin, U     % 26.9  Gamma Globulin, U     % 15.7  M Component, Ur     Not Observed % Not Observed  Please Note:      Comment  Antigliadin Abs, IgA     0 - 19 units 2  Transglutaminase IgA     0 - 3 U/mL <2  IgA/Immunoglobulin A, Serum     90 - 386 mg/dL 140   ROS: + See HPI  I reviewed pt's medications, allergies, PMH, social hx, family hx, and changes were documented in the history of present illness. Otherwise, unchanged from my initial visit note.  Past Medical History:  Diagnosis Date   Abdominal pain 12/01/2015   Asthma    Blood in urine    Diabetes mellitus    GERD (gastroesophageal reflux disease)    Hyperlipidemia    notes only hx of hyperlipidemia   Loss of weight 12/06/2015   MR (mental retardation), moderate    MRSA (methicillin resistant staph aureus) culture positive    Osteoporosis 08/18/2016   Routine general medical examination at a health care facility 10/15/2012    Past Surgical History:  Procedure Laterality Date   HIP SURGERY     Left   INTRAMEDULLARY (IM) NAIL INTERTROCHANTERIC Left 04/10/2014   INTRAMEDULLARY (IM) NAIL INTERTROCHANTERIC Left 04/10/2014   Procedure: INTRAMEDULLARY (IM) NAIL INTERTROCHANTRIC LEFT HIP;  Surgeon: Renette Butters, MD;  Location: [facility];  Service: Orthopedics;  Laterality: Left;    Social History   Socioeconomic History   Marital status: Single    Spouse name: Not on file   Number of children: Not on file   Years of education: Not on file   Highest education level: Not on file  Occupational History   Not on file  Tobacco Use   Smoking status: Never   Smokeless tobacco: Never  Substance and Sexual Activity   Alcohol use: No   Drug use: No   Sexual activity: Never  Other Topics Concern   Not on file  Social History Narrative   Not on file   Social Determinants of Health   Financial Resource Strain: Not on file  Food Insecurity: Not on file  Transportation  Needs: Not on file  Physical Activity: Not on file  Stress: Not on file  Social Connections: Not on file  Intimate Partner Violence: Not on file    Current Outpatient Medications on File Prior to Visit  Medication Sig Dispense Refill   ACCU-CHEK AVIVA PLUS test strip CHECK BLOOD SUGAR 3x DAILY 300 strip 3   Accu-Chek FastClix Lancets MISC CHECK BLOOD SUGARS 3x DAILY 300 each 3   acetaminophen (TYLENOL) 500 MG tablet Take 500 mg by mouth every 4 (four) hours as needed (pain; fever >100).      albuterol (VENTOLIN HFA) 108 (90 Base) MCG/ACT inhaler Inhale 2 puffs into the lungs 4 (four) times daily as needed for wheezing or shortness of breath. Shortness of breath/wheezing 1 each 3   alendronate (FOSAMAX) 70 MG tablet TAKE 1 TABLET BY MOUTH EVERY 7 DAYS. TAKE WITH A FULL GLASS OF WATER ON AN EMPTY STOMACH. 12 tablet 3   Calcium Carbonate (CALCIUM 600 PO) Take by mouth 2 (two) times daily.     Cholecalciferol (VITAMIN D) 2000 units CAPS Take by mouth.     diphenhydrAMINE (BENADRYL) 25 MG tablet Take 25-50 mg by mouth every 6 (six) hours as needed (allergic reaction). Take 2 tablets (50 mg) for ingestion of shellfish and seek emergency help.     docusate sodium (COLACE) 100 MG capsule Take 1 capsule (100 mg total) by mouth 2 (two) times daily. Continue this while taking narcotics to help with bowel movements (Patient taking differently: Take 100 mg by mouth 2 (two) times daily. Continue this while taking  narcotics to help with bowel movements  PRN) 30 capsule 1   famotidine (PEPCID) 40 MG tablet Take 1 tablet (40 mg total) by mouth at bedtime. 90 tablet 0   fluconazole (DIFLUCAN) 150 MG tablet 1 tab po daily x 3 days then repeat again in 1 week 6 tablet 1   fluticasone (FLONASE) 50 MCG/ACT nasal spray Place 1 spray into both nostrils daily as needed for allergies or rhinitis (PRN). 48 mL 0   guaiFENesin-dextromethorphan (ROBITUSSIN DM) 100-10 MG/5ML syrup Take 15 mLs by mouth every 4 (four) hours as  needed for cough (cold symptoms). Reported on 08/17/2015     ibuprofen (ADVIL,MOTRIN) 200 MG tablet Take 200 mg by mouth every 4 (four) hours as needed.     insulin glargine (LANTUS SOLOSTAR) 100 UNIT/ML Solostar Pen INJECT 15 UNITS UNDER THE SKIN DAILY IN AM 30 mL 3   Insulin Lispro-aabc (LYUMJEV KWIKPEN) 100 UNIT/ML KwikPen Inject under skin 10 units before dinner 30 mL 3   Insulin Pen Needle (CAREFINE PEN NEEDLES) 32G X 4 MM MISC Use 2x a day 200 each 3   ketoconazole (NIZORAL) 2 % cream Apply 1 application topically daily. 15 g 1   loperamide (IMODIUM A-D) 2 MG tablet Take 2 mg by mouth as needed for diarrhea or loose stools. Take 2 caplets  or 4 tsp (20 mls) liquid by mouth after first loose stool and 1 caplet or 1 tsp (5 mls) liquid by mouth for each subsequent stool as needed; not to exceed 8 caplets or 50 mls in 24 hours     loratadine (CLARITIN) 10 MG tablet Take 1 tablet (10 mg total) by mouth daily. 90 tablet 0   losartan (COZAAR) 25 MG tablet Take 1 tablet (25 mg total) by mouth daily. 90 tablet 3   metFORMIN (GLUCOPHAGE) 1000 MG tablet TAKE 1 TABLET BY MOUTH TWICE DAILY WITH A MEAL. 180 tablet 3   montelukast (SINGULAIR) 10 MG tablet TAKE 1 TABLET BY MOUTH ONCE DAILY. 31 tablet PRN   sitaGLIPtin (JANUVIA) 100 MG tablet TAKE 1 TABLET (100MG) BY MOUTH ONCE DAILY BEFORE BREAKFAST 30 tablet 11   No current facility-administered medications on file prior to visit.    Allergies  Allergen Reactions   Shellfish Allergy Anaphylaxis   Iohexol     IVP Dye    Family History  Problem Relation Age of Onset   Alcohol abuse Father    Cancer Maternal Grandmother    Hyperlipidemia Maternal Grandmother    Heart disease Maternal Grandmother    Hypertension Maternal Grandmother    Heart disease Maternal Grandfather    Hypertension Maternal Grandfather    Hyperlipidemia Maternal Grandfather    Hyperlipidemia Paternal Grandmother    Heart disease Paternal Grandmother    Hypertension Paternal  Grandmother    Kidney disease Paternal Grandmother    Diabetes Paternal Grandmother    Heart disease Paternal Grandfather    Hypertension Paternal Grandfather    Hyperlipidemia Paternal Grandfather    Alcohol abuse Mother    Pt has FH of DM in father and Paternal GPs.  PE: BP 118/74 (BP Location: Left Arm, Patient Position: Sitting, Cuff Size: Normal)   Pulse 89   Ht _0  (1.803 m)   Wt 143 lb 3.2 oz (65 kg)   SpO2 99%   BMI 19.97 kg/m  Wt Readings from Last 3 Encounters:  02/15/22 143 lb 3.2 oz (65 kg)  10/12/21 144 lb (65.3 kg)  06/14/21 142 lb 6.4 oz (64.6 kg)  Constitutional: thin, in NAD, thoracic kyphosis with poor posture Eyes:EOMI, no exophthalmos ENT: no thyromegaly, no cervical lymphadenopathy Cardiovascular: RRR, No MRG Respiratory: CTA B Musculoskeletal: no deformities Skin: moist, warm, no rashes Neurological: no tremor with outstretched hands  ASSESSMENT: 1. DM1.5, insulin-dependent, uncontrolled, without long term complications  We investigated him for type I DM and GAD Abs were positive >> Type 1.5 DM Component     Latest Ref Rng & Units 10/08/2016  Glucose     65 - 99 mg/dL 73  Pancreatic Islet Cell Antibody     <5 JDF Units <5  Glutamic Acid Decarb Ab     <5 IU/mL 14 (H)  C-Peptide     0.80 - 3.85 ng/mL 1.29   2. Low BMD for age  PLAN:  1. Patient with history of uncontrolled type 1.5 diabetes, with improved control during the coronavirus pandemic as he was not able to eat out, but worsening control after he switched facilities.  HbA1c increased up to 9.2% and sugars also increase to 200s to 300s.  I suggested to add Lyumjev before dinner, but this was not added to right away, due to resistance from the caregiver.  Finally, before last visit, he was taking 6 units before dinner, which was not enough.  I advised him to take up to 10 units especially for larger meals.  At last visit, HbA1c was higher, at 8.6%.  Since then, he had another HbA1c  obtained by PCP and it was lower, at 7.6%. -We did discuss that last visit to check sugars more frequently, in a.m., before dinner, and after dinner, since he mentions that they cannot be checked during the day since he is not in the facility. - at today's visit, HbA1c is even lower: 7.3%! -At today's visit, sugars are mostly at goal in the morning, much improved from before and they are close to goal at night, with occasional higher blood sugars, which the caregiver believes are happening when patient is dining out with his family.  I advised Shanon Fitzpatrick to take 1 more unit of Lyumjev when he dines out.  Also, when they feel that he is getting a smaller dinner at home, I advised him to give him slightly less low, 9 units, before dinner, especially since he had few instances in which his sugars dropped after dinner to the 50s and 60s.  Otherwise, we will continue the current regimen. - I suggested to:  Patient Instructions  Please continue checking sugars 2x a day: - am - ~2h after dinner  Please continue: - Metformin 1000 mg 2x a day with meals - Januvia 100 mg daily in am - Lantus 15 units in am   Use: - Lyumjev 9-10 units before dinner unless he eats out: 11 units  Shanon Fitzpatrick can inject his own insulin.  Call with sugars <70 or >250.  Please continue Fosamax 70 mg weekly.  Try to schedule a new bone density.   Please return in 4 months with your sugar log.   - advised to check sugars at different times of the day - 3x a day, rotating check times - advised for yearly eye exams >> he is UTD - return to clinic in 4 months  2. Low BMD for age -No falls or fractures since last visit -She had a hip fracture while rollerskating in 2015.  At that time, a bone density scan showed mildly low BMD for age.  We started him on calcium and vitamin D 2000 units daily.  He continues on this now.  Plan to recheck his vitamin D at next visit. -Restarted Fosamax in 2018.  He tolerates it well.  Since he has  been on it for 5 years, we discussed at the last visit about taking a drug holiday after the new bone density return.  However, he still did not have this yet.  At last visit,  I ordered this again and given him information so that they can call and schedule it for him.  The caregiver is telling me that patient has an appointment with ophthalmology towards the end of the month and they will schedule the bone density test afterwards. -Latest kidney function and calcium was normal per labs from PCP -At last visit, he was not very active, only doing some mild exercise and gardening. Discussed with pt and caregiver to try to do more consistent exercise.  At the moment, Shanon Fitzpatrick tells me that he gets regular exercise during the day.  Aunt: Evorn Gong [place], [place], [id number]  Philemon Kingdom, MD PhD [facility]

## 2022-02-15 NOTE — Patient Instructions (Addendum)
Please continue checking sugars 2x a day: - am - ~2h after dinner  Please continue: - Metformin 1000 mg 2x a day with meals - Januvia 100 mg daily in am - Lantus 15 units in am   Use: - Lyumjev 9-10 units before dinner unless he eats out: 11 units  Shanon Brow can inject his own insulin.  Call with sugars <70 or >250.  Please continue Fosamax 70 mg weekly.  Try to schedule a new bone density.   Please return in 4 months with your sugar log.

## 2022-03-01 DIAGNOSIS — E109 Type 1 diabetes mellitus without complications: Secondary | ICD-10-CM | POA: Diagnosis not present

## 2022-03-01 DIAGNOSIS — H53023 Refractive amblyopia, bilateral: Secondary | ICD-10-CM | POA: Diagnosis not present

## 2022-06-17 ENCOUNTER — Ambulatory Visit (INDEPENDENT_AMBULATORY_CARE_PROVIDER_SITE_OTHER): Payer: 59 | Admitting: Internal Medicine

## 2022-06-17 ENCOUNTER — Encounter: Payer: Self-pay | Admitting: Internal Medicine

## 2022-06-17 VITALS — BP 120/76 | HR 100 | Ht 71.0 in | Wt 149.8 lb

## 2022-06-17 DIAGNOSIS — M859 Disorder of bone density and structure, unspecified: Secondary | ICD-10-CM

## 2022-06-17 DIAGNOSIS — E139 Other specified diabetes mellitus without complications: Secondary | ICD-10-CM | POA: Diagnosis not present

## 2022-06-17 LAB — POCT GLYCOSYLATED HEMOGLOBIN (HGB A1C): Hemoglobin A1C: 8.3 % — AB (ref 4.0–5.6)

## 2022-06-17 MED ORDER — SITAGLIPTIN PHOSPHATE 100 MG PO TABS: ORAL_TABLET | ORAL | 11 refills | Status: DC

## 2022-06-17 NOTE — Progress Notes (Signed)
Patient ID: YOUSOF Fitzpatrick, adult   DOB: 02/20/1981, 42 y.o.   MRN: [id number]   HPI: Adam Fitzpatrick Adam Fitzpatrick) is a 42 y.o. man, returning for f/u for DM1.5, dx as DM2 in 2011, insulin-dependent, uncontrolled, without long-term complications and low BMD for age.   He usually comes with his aunt, who offers most of the history, as patient is very quiet, and mostly nonverbal however, he is here with his caregiver from his group home Adam Fitzpatrick).  Last visit 4 months ago.  Interim history: He denies o blurry vision/disequilibrium/falls/fractures since last visit.  No increased urination, blurry vision, nausea, chest pain. He relaxed his diet over the holidays, when visiting family was not in the facility, and sugars increased. Since returning to the facility, they are now weighing his food portions and try to keep the amount of carbs per meal stable.  The sugars already started to improve.  DM 1.5:  Reviewed HbA1c levels: Lab Results  Component Value Date   HGBA1C 7.3 (A) 02/15/2022   HGBA1C 8.6 (A) 10/12/2021   HGBA1C 7.4 (A) 06/15/2021   HGBA1C 8.1 (A) 03/13/2021   HGBA1C 8.4 (A) 11/29/2020   HGBA1C 9.2 (A) 10/03/2020   HGBA1C 6.1 (A) 11/02/2019   HGBA1C 6.4 08/26/2019   HGBA1C 6.4 03/12/2019   HGBA1C 7.7 (A) 07/28/2018  02/06/2022: HbA1c 7.6%  He is on: - Metformin 1000 mg 2x a day, with meals >> 2000 mg with dinner >> 1000 mg 2x a day with meals - >> Lyumjev 6 units before dinner >> 6 (pt was not given the higher dose) >> 10 units before dinner >>  9-10 units before dinner unless eating out: 11 units - Onglyza 5 mg daily in am >>  Januvia 100 mg daily in a.m. - Lantus 9 >> 11 >> 15 units at bedtime >> in am He had chest discomfort with glipizide.  His CBGs are checked 2x a day at the facility: - am:  61, 79-130 >> 125-199 >> 77-127, 141 >> 70, 103-198 - 2h after b'fast: n/c >> 138, 145 >> n/c - before lunch: n/c >> 145 >> n/c - 2h after lunch: n/c >> 128 >> n/c - before dinner:  n/c >> 138 >> n/c - 2h after dinner: 134, 153-302 >> 55, 61, 85-302 >> 66, 97-322 - bedtime: n/c - nighttime: n/c Lowest sugar was 692 >> 90 >> 61 >> 94 >> 58; it is unclear at which level he has hypoglycemia awareness. Highest sugar was 182 >> 332 >> 357 >> 302 >> 322.  Glucometer: AccuChek Aviva  Pt's meals are: - Breakfast:  cereal: cheerios + 2% milk >> bacon + eggs, 1-2 waffle; b'fast burritos - Lunch: 2 PB sandwiches on wheat >> now prepacked whole meals >45 g cabs >> soup, lunch bowl (up to 45g carbs) - Dinner: low carb >> now: same: 45-50g carbs >> home cooked: steak + mac and cheese or other starch, greens - Snacks: 2 snack a day: 10 am and  pm: nabs, sugar free jello cup, fruit He lost a significant amount of weight before starting insulin: 25-30 lbs.  -No CKD, last BUN/creatinine:  02/06/2022: 14/0.85, GFR 113; glucose 256; ACR 4; Urine Glu 500 10/13/2020: Glucose 246, BUN/creatinine 12/0.84, GFR 114 Lab Results  Component Value Date   BUN 8 08/26/2019   BUN 9 07/15/2017   CREATININE 0.75 08/26/2019   CREATININE 0.75 07/15/2017  On losartan.  - + HL; last set of lipids: 02/06/2022: 159/93/62/80 10/13/2020: 173/103/51/103 Lab Results  Component Value Date   CHOL 145 08/26/2019   HDL 53.10 08/26/2019   LDLCALC 80 08/26/2019   TRIG 59.0 08/26/2019   CHOLHDL 3 08/26/2019  He is not on a statin.  - last eye exam was in 02/2022: No DR reportedly.   - + numbness and tingling in his feet.  Last foot exam 07/03/2021 by PCP: Reviewed note: Left: Monofilament test: Sensation normal Pulses: present Skin: Normal and no erythema, no cyanosis or pallor  Other findings: none Right: Monofilament test: Sensation normal Pulses: present Skin: Normal and no erythema, no cyanosis or pallor  Other findings: none Exam performed with shoes and socks removed.  B12 levels were normal: Lab Results  Component Value Date   VITAMINB12 458 03/26/2018   VITAMINB12 549 11/17/2015   Low  BMD for age:  Reviewed history:  Reviewed the reports of available DXA scans: Z-score Lumbar spine (L1-L4) Femoral neck (FN) 33% distal radius UD radius  11/02/2019 ([facility]) -1.8 RFN: -2.2 LFN: n/a -0.9 n/a  08/13/2016 ([facility]) -2.6 RFN: -2.4 LFN: n/a -0.1 n/a  05/04/2014 -2.1 RFN: -2.2 +0.6 +0.1   He had a left hip fracture in 2018 while [place] [employer].  Vitamin D level was normal at last check: 10/13/2020: Vitamin D 32.6 Lab Results  Component Value Date   VD25OH 52.0 11/02/2019   VD25OH 82.12 03/26/2018   VD25OH 72.84 11/17/2015   Calcitriol level was normal: Component     Latest Ref Rng & Units 10/08/2016  Vitamin D 1, 25 (OH) Total     18 - 72 pg/mL 31  Vitamin D3 1, 25 (OH)     pg/mL reviewed and addended history: 31  Vitamin D2 1, 25 (OH)     pg/mL <8   He continues on calcium 600 mg twice a day and vitamin D 2000 units daily.  We started Fosamax 11/2016.  No side effects.  No weightbearing exercises.  She is not on vitamin A supplements  Pt does have a FH of osteoporosis: MGM.  No hypo or hypercalcemia or hyperparathyroidism.  No history of kidney stones. 02/06/2022: calcium 9.9 (8.7-10.2) 10/13/2020: Calcium 9.9 03/12/2019: calcium 9.4 (8.4-10.2) Lab Results  Component Value Date   PTH 21 10/08/2016   CALCIUM 9.7 08/26/2019   CALCIUM 10.5 07/15/2017   CALCIUM 9.8 10/08/2016   CALCIUM 10.3 12/01/2015   CALCIUM 9.8 11/17/2015   24-hour urine calcium was normal: Component     Latest Ref Rng & Units 10/14/2016  Creatinine, Urine     20 - 370 mg/dL 121  Creatinine, 24H Ur     0.63 - 2.50 g/24 h 1.57  Calcium, Ur     Not estab mg/dL 23  Calcium, 24 hour urine     55 - 300 mg/24 h 299   No thyrotoxicosis.  Reviewed TSH levels: 02/06/2022: TSH 1.380 Lab Results  Component Value Date   TSH 1.13 08/26/2019   TSH 1.35 07/15/2017   TSH 1.26 08/13/2016   Multiple myeloma and celiac disease work-up were negative: Component     Latest  Ref Rng & Units 10/08/2016  Protein Urine Random     Not Estab. mg/dL 19.8  Albumin ELP, Urine     % 40.3  Alpha-1-Globulin, U     % 2.1  ALPHA-2-GLOBULIN, U     % 15.1  Beta Globulin, U     % 26.9  Gamma Globulin, U     % 15.7  M Component, Ur  Not Observed % Not Observed  Please Note:      Comment  Antigliadin Abs, IgA     0 - 19 units 2  Transglutaminase IgA     0 - 3 U/mL <2  IgA/Immunoglobulin A, Serum     90 - 386 mg/dL 140   ROS: + See HPI  I reviewed pt's medications, allergies, PMH, social hx, family hx, and changes were documented in the history of present illness. Otherwise, unchanged from my initial visit note.  Past Medical History:  Diagnosis Date   Abdominal pain 12/01/2015   Asthma    Blood in urine    Diabetes mellitus    GERD (gastroesophageal reflux disease)    Hyperlipidemia    notes only hx of hyperlipidemia   Loss of weight 12/06/2015   MR (mental retardation), moderate    MRSA (methicillin resistant staph aureus) culture positive    Osteoporosis 08/18/2016   Routine general medical examination at a health care facility 10/15/2012    Past Surgical History:  Procedure Laterality Date   HIP SURGERY     Left   INTRAMEDULLARY (IM) NAIL INTERTROCHANTERIC Left 04/10/2014   INTRAMEDULLARY (IM) NAIL INTERTROCHANTERIC Left 04/10/2014   Procedure: INTRAMEDULLARY (IM) NAIL INTERTROCHANTRIC LEFT HIP;  Surgeon: Renette Butters, MD;  Location: [facility];  Service: Orthopedics;  Laterality: Left;    Social History   Socioeconomic History   Marital status: Single    Spouse name: Not on file   Number of children: Not on file   Years of education: Not on file   Highest education level: Not on file  Occupational History   Not on file  Tobacco Use   Smoking status: Never   Smokeless tobacco: Never  Substance and Sexual Activity   Alcohol use: No   Drug use: No   Sexual activity: Never  Other Topics Concern   Not on file  Social History Narrative    Not on file   Social Determinants of Health   Financial Resource Strain: Not on file  Food Insecurity: Not on file  Transportation Needs: Not on file  Physical Activity: Not on file  Stress: Not on file  Social Connections: Not on file  Intimate Partner Violence: Not on file    Current Outpatient Medications on File Prior to Visit  Medication Sig Dispense Refill   ACCU-CHEK AVIVA PLUS test strip CHECK BLOOD SUGAR 3x DAILY 300 strip 3   Accu-Chek FastClix Lancets MISC CHECK BLOOD SUGARS 3x DAILY 300 each 3   acetaminophen (TYLENOL) 500 MG tablet Take 500 mg by mouth every 4 (four) hours as needed (pain; fever >100).      albuterol (VENTOLIN HFA) 108 (90 Base) MCG/ACT inhaler Inhale 2 puffs into the lungs 4 (four) times daily as needed for wheezing or shortness of breath. Shortness of breath/wheezing 1 each 3   alendronate (FOSAMAX) 70 MG tablet TAKE 1 TABLET BY MOUTH EVERY 7 DAYS. TAKE WITH A FULL GLASS OF WATER ON AN EMPTY STOMACH. 12 tablet 3   Calcium Carbonate (CALCIUM 600 PO) Take by mouth 2 (two) times daily.     Cholecalciferol (VITAMIN D) 2000 units CAPS Take by mouth.     diphenhydrAMINE (BENADRYL) 25 MG tablet Take 25-50 mg by mouth every 6 (six) hours as needed (allergic reaction). Take 2 tablets (50 mg) for ingestion of shellfish and seek emergency help.     docusate sodium (COLACE) 100 MG capsule Take 1 capsule (100 mg total) by mouth 2 (two)  times daily. Continue this while taking narcotics to help with bowel movements (Patient taking differently: Take 100 mg by mouth 2 (two) times daily. Continue this while taking narcotics to help with bowel movements  PRN) 30 capsule 1   famotidine (PEPCID) 40 MG tablet Take 1 tablet (40 mg total) by mouth at bedtime. 90 tablet 0   fluconazole (DIFLUCAN) 150 MG tablet 1 tab po daily x 3 days then repeat again in 1 week 6 tablet 1   fluticasone (FLONASE) 50 MCG/ACT nasal spray Place 1 spray into both nostrils daily as needed for allergies or  rhinitis (PRN). 48 mL 0   guaiFENesin-dextromethorphan (ROBITUSSIN DM) 100-10 MG/5ML syrup Take 15 mLs by mouth every 4 (four) hours as needed for cough (cold symptoms). Reported on 08/17/2015     ibuprofen (ADVIL,MOTRIN) 200 MG tablet Take 200 mg by mouth every 4 (four) hours as needed.     insulin glargine (LANTUS SOLOSTAR) 100 UNIT/ML Solostar Pen INJECT 15 UNITS UNDER THE SKIN DAILY IN AM 30 mL 3   Insulin Lispro-aabc (LYUMJEV KWIKPEN) 100 UNIT/ML KwikPen Inject under skin 9-11 units before dinner 30 mL 3   Insulin Pen Needle (CAREFINE PEN NEEDLES) 32G X 4 MM MISC Use 2x a day 200 each 3   ketoconazole (NIZORAL) 2 % cream Apply 1 application topically daily. 15 g 1   loperamide (IMODIUM A-D) 2 MG tablet Take 2 mg by mouth as needed for diarrhea or loose stools. Take 2 caplets  or 4 tsp (20 mls) liquid by mouth after first loose stool and 1 caplet or 1 tsp (5 mls) liquid by mouth for each subsequent stool as needed; not to exceed 8 caplets or 50 mls in 24 hours     loratadine (CLARITIN) 10 MG tablet Take 1 tablet (10 mg total) by mouth daily. 90 tablet 0   losartan (COZAAR) 25 MG tablet Take 1 tablet (25 mg total) by mouth daily. 90 tablet 3   metFORMIN (GLUCOPHAGE) 1000 MG tablet TAKE 1 TABLET BY MOUTH TWICE DAILY WITH A MEAL. 180 tablet 3   montelukast (SINGULAIR) 10 MG tablet TAKE 1 TABLET BY MOUTH ONCE DAILY. 31 tablet PRN   sitaGLIPtin (JANUVIA) 100 MG tablet TAKE 1 TABLET (100MG) BY MOUTH ONCE DAILY BEFORE BREAKFAST 30 tablet 11   No current facility-administered medications on file prior to visit.    Allergies  Allergen Reactions   Shellfish Allergy Anaphylaxis   Iohexol     IVP Dye    Family History  Problem Relation Age of Onset   Alcohol abuse Father    Cancer Maternal Grandmother    Hyperlipidemia Maternal Grandmother    Heart disease Maternal Grandmother    Hypertension Maternal Grandmother    Heart disease Maternal Grandfather    Hypertension Maternal Grandfather     Hyperlipidemia Maternal Grandfather    Hyperlipidemia Paternal Grandmother    Heart disease Paternal Grandmother    Hypertension Paternal Grandmother    Kidney disease Paternal Grandmother    Diabetes Paternal Grandmother    Heart disease Paternal Grandfather    Hypertension Paternal Grandfather    Hyperlipidemia Paternal Grandfather    Alcohol abuse Mother    Pt has FH of DM in father and Paternal GPs.  PE: BP 120/76 (BP Location: Right Arm, Patient Position: Sitting, Cuff Size: Normal)   Pulse 100   Ht _0  (1.803 m)   Wt 149 lb 12.8 oz (67.9 kg)   SpO2 99%   BMI 20.89 kg/m  Wt Readings from Last 3 Encounters:  06/17/22 149 lb 12.8 oz (67.9 kg)  02/15/22 143 lb 3.2 oz (65 kg)  10/12/21 144 lb (65.3 kg)   Constitutional: thin, in NAD, thoracic kyphosis with poor posture Eyes:EOMI, no exophthalmos ENT: no cervical lymphadenopathy Cardiovascular: tachycardia, RR, No MRG Respiratory: CTA B Musculoskeletal: no deformities Skin: no rashes Neurological: no tremor with outstretched hands  ASSESSMENT: 1. DM1.5, insulin-dependent, uncontrolled, without long term complications  We investigated him for type I DM and GAD Abs were positive >> Type 1.5 DM Component     Latest Ref Rng & Units 10/08/2016  Glucose     65 - 99 mg/dL 73  Pancreatic Islet Cell Antibody     <5 JDF Units <5  Glutamic Acid Decarb Ab     <5 IU/mL 14 (H)  C-Peptide     0.80 - 3.85 ng/mL 1.29   2. Low BMD for age  PLAN:  1. Patient with history of uncontrolled type 1.5 diabetes, with improved control during the coronavirus pandemic as he was not able to eat out, but worsening control after switching facilities.  His HbA1c again improved at last visit, from 8.6% to 7.6% and then to 7.3% after adding Lyumjev before dinner.  At last visit, I advised him to take a slightly higher dose if he was eating out, but I did not change his regimen otherwise. -At today's visit, sugars are quite fluctuating, but they  are higher than before, up to 300s especially at night.  However, there is clear improvement in his blood sugars within the last week, after improving diet and eating more consistent carb portions.  For now, I encouraged the caregiver to continue with the dietary changes, but I did not necessarily suggest a change in regimen.  The caregiver expressed his concern that Adam Fitzpatrick may not have taken the Lyumjev at the higher dose when eating dinner at home.  We did discuss that this is necessary for larger meals or if he has dessert after a meal.  In fact, at today's visit, I advised him to take 12 to 13 units before such meals.  Otherwise, we can continue the same regimen. - I suggested to:  Patient Instructions  Please continue checking sugars 2x a day: - am - ~2h after dinner  Please continue: - Metformin 1000 mg 2x a day with meals - Januvia 100 mg daily in am - Lantus 15 units in am  - Lyumjev 9-10 units before dinner unless eating out: 12-13 units before a larger meal or if Adam have dessert after the meal.  Adam Fitzpatrick can inject his own insulin.  Call with sugars <70 or >250.  Please continue Fosamax 70 mg weekly.  Try to schedule a new bone density.   Please ask your PCP to send me your most recent labs and Office Visit note.  Please return in 4 months with your sugar log.   - we checked his HbA1c:8.3% - higher  - advised to check sugars at different times of the day - 2x a day, rotating check times - advised for yearly eye exams >> he is UTD - return to clinic in 4 months  2. Low BMD for age -No falls or fractures since last visit -he had a hip fracture while rollerskating in 2015.  At that time, a bone density scan showed mildly low BMD for age.  We started him on calcium and vitamin D 2000 units daily.  He continues on this now.  Will  need to check a vitamin D level today. -we started Fosamax in 2018 - tolerated well.  Since he has been on it for 5 years, we discussed at the last visit  about taking a drug holiday after the new bone density return.  I have ordered this for him to be daily but he did not have it done yet.  At last visit, his caregiver was telling me that patient had an appointment with ophthalmology towards the end of the month and they planning to schedule the bone density test afterwards.  He did not have this yet. - Ca and GFR were normal per labs from PCP 02/2022 -At last visit, Adam Fitzpatrick last elevated he was getting regular exercise during the day; previously, only mild exercise and gardening.  At this visit, he tells me that he started to use some weights.  I encouraged him to continue with this.  Aunt: Evorn Gong [place], [place], [address]  Philemon Kingdom, MD PhD [facility]

## 2022-06-17 NOTE — Patient Instructions (Addendum)
Please continue checking sugars 2x a day: - am - ~2h after dinner  Please continue: - Metformin 1000 mg 2x a day with meals - Januvia 100 mg daily in am - Lantus 15 units in am  - Lyumjev 9-10 units before dinner unless eating out: 12-13 units before a larger meal or if you have dessert after the meal.  Adam Fitzpatrick can inject his own insulin.  Call with sugars <70 or >250.  Please continue Fosamax 70 mg weekly.  Try to schedule a new bone density.   Please ask your PCP to send me your most recent labs and Office Visit note.  Please return in 4 months with your sugar log.

## 2022-06-27 ENCOUNTER — Encounter: Payer: Self-pay | Admitting: Internal Medicine

## 2022-06-27 ENCOUNTER — Other Ambulatory Visit: Payer: Self-pay | Admitting: Internal Medicine

## 2022-06-27 DIAGNOSIS — M859 Disorder of bone density and structure, unspecified: Secondary | ICD-10-CM

## 2022-07-18 DIAGNOSIS — M8588 Other specified disorders of bone density and structure, other site: Secondary | ICD-10-CM | POA: Diagnosis not present

## 2022-07-18 DIAGNOSIS — M810 Age-related osteoporosis without current pathological fracture: Secondary | ICD-10-CM | POA: Diagnosis not present

## 2022-07-22 ENCOUNTER — Encounter: Payer: Self-pay | Admitting: Internal Medicine

## 2022-07-29 DIAGNOSIS — R509 Fever, unspecified: Secondary | ICD-10-CM | POA: Diagnosis not present

## 2022-07-29 DIAGNOSIS — L02411 Cutaneous abscess of right axilla: Secondary | ICD-10-CM | POA: Diagnosis not present

## 2022-07-31 NOTE — Telephone Encounter (Signed)
Opened in error

## 2022-08-09 DIAGNOSIS — K219 Gastro-esophageal reflux disease without esophagitis: Secondary | ICD-10-CM | POA: Diagnosis not present

## 2022-08-09 DIAGNOSIS — E139 Other specified diabetes mellitus without complications: Secondary | ICD-10-CM | POA: Diagnosis not present

## 2022-08-09 DIAGNOSIS — M859 Disorder of bone density and structure, unspecified: Secondary | ICD-10-CM | POA: Diagnosis not present

## 2022-08-19 ENCOUNTER — Ambulatory Visit (INDEPENDENT_AMBULATORY_CARE_PROVIDER_SITE_OTHER): Payer: 59 | Admitting: Podiatry

## 2022-08-19 DIAGNOSIS — B351 Tinea unguium: Secondary | ICD-10-CM | POA: Diagnosis not present

## 2022-08-19 DIAGNOSIS — M79675 Pain in left toe(s): Secondary | ICD-10-CM | POA: Diagnosis not present

## 2022-08-19 DIAGNOSIS — H5203 Hypermetropia, bilateral: Secondary | ICD-10-CM | POA: Diagnosis not present

## 2022-08-19 DIAGNOSIS — M79674 Pain in right toe(s): Secondary | ICD-10-CM | POA: Diagnosis not present

## 2022-08-19 DIAGNOSIS — E1149 Type 2 diabetes mellitus with other diabetic neurological complication: Secondary | ICD-10-CM | POA: Diagnosis not present

## 2022-08-19 MED ORDER — GABAPENTIN 100 MG PO CAPS: 100.0000 mg | ORAL_CAPSULE | Freq: Every day | ORAL | 0 refills | Status: DC

## 2022-08-19 NOTE — Progress Notes (Unsigned)
Subjective:   Patient ID: Adam Fitzpatrick, male   DOB: 42 y.o.   MRN: [id number]   HPI Chief Complaint  Patient presents with   routine foot care     Diabetic, diabetic foot care/ nail trim    He states the feet get numb, cold, tingle. No history of ulcerations. No claudication symptoms.   Occasional cramp in arch of the feet.    Last A1c was 8.3 Endo:   ROS      Objective:  Physical Exam  ***     Assessment:  ***     Plan:  ***

## 2022-08-19 NOTE — Patient Instructions (Signed)
Diabetes Mellitus and Foot Care Diabetes, also called diabetes mellitus, may cause problems with your feet and legs because of poor blood flow (circulation). Poor circulation may make your skin: Become thinner and drier. Break more easily. Heal more slowly. Peel and crack. You may also have nerve damage (neuropathy). This can cause decreased feeling in your legs and feet. This means that you may not notice minor injuries to your feet that could lead to more serious problems. Finding and treating problems early is the best way to prevent future foot problems. How to care for your feet Foot hygiene  Wash your feet daily with warm water and mild soap. Do not use hot water. Then, pat your feet and the areas between your toes until they are fully dry. Do not soak your feet. This can dry your skin. Trim your toenails straight across. Do not dig under them or around the cuticle. File the edges of your nails with an emery board or nail file. Apply a moisturizing lotion or petroleum jelly to the skin on your feet and to dry, brittle toenails. Use lotion that does not contain alcohol and is unscented. Do not apply lotion between your toes. Shoes and socks Wear clean socks or stockings every day. Make sure they are not too tight. Do not wear knee-high stockings. These may decrease blood flow to your legs. Wear shoes that fit well and have enough cushioning. Always look in your shoes before you put them on to be sure there are no objects inside. To break in new shoes, wear them for just a few hours a day. This prevents injuries on your feet. Wounds, scrapes, corns, and calluses  Check your feet daily for blisters, cuts, bruises, sores, and redness. If you cannot see the bottom of your feet, use a mirror or ask someone for help. Do not cut off corns or calluses or try to remove them with medicine. If you find a minor scrape, cut, or break in the skin on your feet, keep it and the skin around it clean and  dry. You may clean these areas with mild soap and water. Do not clean the area with peroxide, alcohol, or iodine. If you have a wound, scrape, corn, or callus on your foot, look at it several times a day to make sure it is healing and not infected. Check for: Redness, swelling, or pain. Fluid or blood. Warmth. Pus or a bad smell. General tips Do not cross your legs. This may decrease blood flow to your feet. Do not use heating pads or hot water bottles on your feet. They may burn your skin. If you have lost feeling in your feet or legs, you may not know this is happening until it is too late. Protect your feet from hot and cold by wearing shoes, such as at the beach or on hot pavement. Schedule a complete foot exam at least once a year or more often if you have foot problems. Report any cuts, sores, or bruises to your health care provider right away. Where to find more information American Diabetes Association: diabetes.org Association of Diabetes Care & Education Specialists: diabeteseducator.org Contact a health care provider if: You have a condition that increases your risk of infection, and you have any cuts, sores, or bruises on your feet. You have an injury that is not healing. You have redness on your legs or feet. You feel burning or tingling in your legs or feet. You have pain or cramps in your legs   and feet. Your legs or feet are numb. Your feet always feel cold. You have pain around any toenails. Get help right away if: You have a wound, scrape, corn, or callus on your foot and: You have signs of infection. You have a fever. You have a red line going up your leg. This information is not intended to replace advice given to you by your health care provider. Make sure you discuss any questions you have with your health care provider. Document Revised: 11/21/2021 Document Reviewed: 11/21/2021 Elsevier Patient Education  [place].   Gabapentin Capsules or  Tablets What is this medication? GABAPENTIN (GA ba pen tin) treats nerve pain. It may also be used to prevent and control seizures in people with epilepsy. It works by calming overactive nerves in your body. This medicine may be used for other purposes; ask your health care provider or pharmacist if you have questions. COMMON BRAND NAME(S): Active-PAC with Gabapentin, Orpha Bur, Gralise, Neurontin What should I tell my care team before I take this medication? They need to know if you have any of these conditions: Alcohol or substance use disorder Kidney disease Lung or breathing disease Suicidal thoughts, plans, or attempt; a previous suicide attempt by you or a family member An unusual or allergic reaction to gabapentin, other medications, foods, dyes, or preservatives Pregnant or trying to get pregnant Breast-feeding How should I use this medication? Take this medication by mouth with a glass of water. Follow the directions on the prescription label. You can take it with or without food. If it upsets your stomach, take it with food. Take your medication at regular intervals. Do not take it more often than directed. Do not stop taking except on your care team's advice. If you are directed to break the 600 or 800 mg tablets in half as part of your dose, the extra half tablet should be used for the next dose. If you have not used the extra half tablet within 28 days, it should be thrown away. A special MedGuide will be given to you by the pharmacist with each prescription and refill. Be sure to read this information carefully each time. Talk to your care team about the use of this medication in children. While this medication may be prescribed for children as young as 3 years for selected conditions, precautions do apply. Overdosage: If you think you have taken too much of this medicine contact a poison control center or emergency room at once. NOTE: This medicine is only for you. Do not share this  medicine with others. What if I miss a dose? If you miss a dose, take it as soon as you can. If it is almost time for your next dose, take only that dose. Do not take double or extra doses. What may interact with this medication? Alcohol Antihistamines for allergy, cough, and cold Certain medications for anxiety or sleep Certain medications for depression like amitriptyline, fluoxetine, sertraline Certain medications for seizures like phenobarbital, primidone Certain medications for stomach problems General anesthetics like halothane, isoflurane, methoxyflurane, propofol Local anesthetics like lidocaine, pramoxine, tetracaine Medications that relax muscles for surgery Opioid medications for pain Phenothiazines like chlorpromazine, mesoridazine, prochlorperazine, thioridazine This list may not describe all possible interactions. Give your health care provider a list of all the medicines, herbs, non-prescription drugs, or dietary supplements you use. Also tell them if you smoke, drink alcohol, or use illegal drugs. Some items may interact with your medicine. What should I watch for while using this  medication? Visit your care team for regular checks on your progress. You may want to keep a record at home of how you feel your condition is responding to treatment. You may want to share this information with your care team at each visit. You should contact your care team if your seizures get worse or if you have any new types of seizures. Do not stop taking this medication or any of your seizure medications unless instructed by your care team. Stopping your medication suddenly can increase your seizures or their severity. This medication may cause serious skin reactions. They can happen weeks to months after starting the medication. Contact your care team right away if you notice fevers or flu-like symptoms with a rash. The rash may be red or purple and then turn into blisters or peeling of the skin.  Or, you might notice a red rash with swelling of the face, lips or lymph nodes in your neck or under your arms. Wear a medical identification bracelet or chain if you are taking this medication for seizures. Carry a card that lists all your medications. This medication may affect your coordination, reaction time, or judgment. Do not drive or operate machinery until you know how this medication affects you. Sit up or stand slowly to reduce the risk of dizzy or fainting spells. Drinking alcohol with this medication can increase the risk of these side effects. Your mouth may get dry. Chewing sugarless gum or sucking hard candy, and drinking plenty of water may help. Watch for new or worsening thoughts of suicide or depression. This includes sudden changes in mood, behaviors, or thoughts. These changes can happen at any time but are more common in the beginning of treatment or after a change in dose. Call your care team right away if you experience these thoughts or worsening depression. If you become pregnant while using this medication, you may enroll in the [place] Pregnancy Registry by calling [phone]. This registry collects information about the safety of antiepileptic medication use during pregnancy. What side effects may I notice from receiving this medication? Side effects that you should report to your care team as soon as possible: Allergic reactions or angioedema--skin rash, itching, hives, swelling of the face, eyes, lips, tongue, arms, or legs, trouble swallowing or breathing Rash, fever, and swollen lymph nodes Thoughts of suicide or self harm, worsening mood, feelings of depression Trouble breathing Unusual changes in mood or behavior in children after use such as difficulty concentrating, hostility, or restlessness Side effects that usually do not require medical attention (report to your care team if they continue or are  bothersome): Dizziness Drowsiness Nausea Swelling of ankles, feet, or hands Vomiting This list may not describe all possible side effects. Call your doctor for medical advice about side effects. You may report side effects to FDA at 1-800-FDA-1088. Where should I keep my medication? Keep out of reach of children and pets. Store at room temperature between 15 and 30 degrees C (59 and 86 degrees F). Get rid of any unused medication after the expiration date. This medication may cause accidental overdose and death if taken by other adults, children, or pets. To get rid of medications that are no longer needed or have expired: Take the medication to a medication take-back program. Check with your pharmacy or law enforcement to find a location. If you cannot return the medication, check the label or package insert to see if the medication should be thrown out in the garbage  or flushed down the toilet. If you are not sure, ask your care team. If it is safe to put it in the trash, empty the medication out of the container. Mix the medication with cat litter, dirt, coffee grounds, or other unwanted substance. Seal the mixture in a bag or container. Put it in the trash. NOTE: This sheet is a summary. It may not cover all possible information. If you have questions about this medicine, talk to your doctor, pharmacist, or health care provider.  2023 Elsevier/Gold Standard (2020-11-20 00:00:00)

## 2022-08-20 ENCOUNTER — Other Ambulatory Visit: Payer: Self-pay

## 2022-08-20 ENCOUNTER — Telehealth: Payer: Self-pay

## 2022-08-20 MED ORDER — GABAPENTIN 100 MG PO CAPS: 100.0000 mg | ORAL_CAPSULE | Freq: Every day | ORAL | 0 refills | Status: DC

## 2022-08-20 NOTE — Telephone Encounter (Signed)
Prescription has been faxed over today on 08/20/22.

## 2022-08-28 ENCOUNTER — Encounter: Payer: Self-pay | Admitting: Podiatry

## 2022-08-28 ENCOUNTER — Telehealth: Payer: Self-pay

## 2022-09-03 NOTE — Telephone Encounter (Signed)
This was taken care of in another encounter.  

## 2022-09-18 ENCOUNTER — Other Ambulatory Visit: Payer: Self-pay

## 2022-09-18 DIAGNOSIS — E139 Other specified diabetes mellitus without complications: Secondary | ICD-10-CM

## 2022-09-18 MED ORDER — ACCU-CHEK AVIVA PLUS VI STRP: ORAL_STRIP | 3 refills | Status: DC

## 2022-10-15 ENCOUNTER — Encounter: Payer: Self-pay | Admitting: Internal Medicine

## 2022-10-15 ENCOUNTER — Ambulatory Visit (INDEPENDENT_AMBULATORY_CARE_PROVIDER_SITE_OTHER): Payer: 59 | Admitting: Internal Medicine

## 2022-10-15 VITALS — BP 138/82 | HR 75 | Ht 71.0 in | Wt 147.6 lb

## 2022-10-15 DIAGNOSIS — M859 Disorder of bone density and structure, unspecified: Secondary | ICD-10-CM

## 2022-10-15 DIAGNOSIS — Z7984 Long term (current) use of oral hypoglycemic drugs: Secondary | ICD-10-CM

## 2022-10-15 DIAGNOSIS — E1365 Other specified diabetes mellitus with hyperglycemia: Secondary | ICD-10-CM

## 2022-10-15 DIAGNOSIS — Z794 Long term (current) use of insulin: Secondary | ICD-10-CM

## 2022-10-15 DIAGNOSIS — E119 Type 2 diabetes mellitus without complications: Secondary | ICD-10-CM

## 2022-10-15 DIAGNOSIS — E139 Other specified diabetes mellitus without complications: Secondary | ICD-10-CM

## 2022-10-15 LAB — POCT GLYCOSYLATED HEMOGLOBIN (HGB A1C): Hemoglobin A1C: 7.9 % — AB (ref 4.0–5.6)

## 2022-10-15 MED ORDER — CAREFINE PEN NEEDLES 32G X 4 MM MISC: 3 refills | Status: DC

## 2022-10-15 MED ORDER — LANTUS SOLOSTAR 100 UNIT/ML ~~LOC~~ SOPN: PEN_INJECTOR | SUBCUTANEOUS | 3 refills | Status: DC

## 2022-10-15 MED ORDER — ACCU-CHEK AVIVA PLUS VI STRP: ORAL_STRIP | 3 refills | Status: DC

## 2022-10-15 MED ORDER — LYUMJEV KWIKPEN 100 UNIT/ML ~~LOC~~ SOPN: PEN_INJECTOR | SUBCUTANEOUS | 3 refills | Status: DC

## 2022-10-15 MED ORDER — LOSARTAN POTASSIUM 25 MG PO TABS: 25.0000 mg | ORAL_TABLET | Freq: Every day | ORAL | 3 refills | Status: AC

## 2022-10-15 MED ORDER — ACCU-CHEK FASTCLIX LANCETS MISC
3 refills | Status: DC
Start: 2022-10-15 — End: 2023-10-13

## 2022-10-15 MED ORDER — ALENDRONATE SODIUM 70 MG PO TABS
ORAL_TABLET | ORAL | 3 refills | Status: DC
Start: 2022-10-15 — End: 2024-01-07

## 2022-10-15 NOTE — Patient Instructions (Addendum)
Please continue checking sugars 2x a day: - am - ~2h after dinner  Please continue: - Metformin 1000 mg 2x a day with meals - Januvia 100 mg daily in am - Lantus 15 units in am  - Lyumjev 9-10 units before dinner unless eating out: 12-13 units before a larger meal or if you have dessert after the meal.  Adam Fitzpatrick can inject his own insulin.  Call with sugars <70 or >250.  Please continue Fosamax 70 mg weekly.   Please send me the DEXA scan report and most recently vitamin D level.  Please return in 4 months with your sugar log.

## 2022-10-15 NOTE — Progress Notes (Signed)
Patient ID: KAZUKI RAYGOR, adult   DOB: [birth year], 42 y.o.   MRN: [id number]   HPI: KAMARION CICALE Onalee Hua) is a 42 y.o. man, returning for f/u for DM1.5, dx as DM2 in 2011, insulin-dependent, uncontrolled, without long-term complications and low BMD for age.   Today, he comes with his aunt, who offers most of the history, as patient is very quiet, and mostly nonverbal.  Last visit was 4 months ago.  Interim history: He denies blurry vision/disequilibrium/falls/fractures since last visit.  No increased urination, blurry vision, nausea, chest pain. He was not given a blood sugar log from the facility.   Also, I do not have his latest DXA scan results.  DM 1.5:  Reviewed HbA1c levels: Lab Results  Component Value Date   HGBA1C 8.3 (A) 06/17/2022   HGBA1C 7.3 (A) 02/15/2022   HGBA1C 8.6 (A) 10/12/2021   HGBA1C 7.4 (A) 06/15/2021   HGBA1C 8.1 (A) 03/13/2021   HGBA1C 8.4 (A) 11/29/2020   HGBA1C 9.2 (A) 10/03/2020   HGBA1C 6.1 (A) 11/02/2019   HGBA1C 6.4 08/26/2019   HGBA1C 6.4 03/12/2019  02/06/2022: HbA1c 7.6%  He is on: - Metformin 1000 mg 2x a day, with meals >> 2000 mg with dinner >> 1000 mg 2x a day with meals - Lyumjev 9-10 units before dinner unless eating out: 12-13 units before a larger meal or if you have dessert after the meal - Onglyza 5 mg daily in am >>  Januvia 100 mg daily in a.m. - Lantus 9 >> 11 >> 15 units at bedtime >> in am He had chest discomfort with glipizide.  His CBGs are checked 2x a day at the facility: - am:  61, 79-130 >> 125-199 >> 77-127, 141 >> 70, 103-198 >> ? - 2h after b'fast: n/c >> 138, 145 >> n/c - before lunch: n/c >> 145 >> n/c - 2h after lunch: n/c >> 128 >> n/c - before dinner: n/c >> 138 >> n/c - 2h after dinner: 134, 153-302 >> 55, 61, 85-302 >> 66, 97-322 >> ? - bedtime: n/c - nighttime: n/c Lowest sugar was 692 >> 90 >> 61 >> 94 >> 58 >> 58 (exercising); it is unclear at which level he has hypoglycemia awareness. Highest sugar  was 182 >> 332 >> 357 >> 302 >> 322 >> 281.  Glucometer: AccuChek Aviva  Pt's meals are: - Breakfast:  cereal: cheerios + 2% milk >> bacon + eggs, 1-2 waffle; b'fast burritos - Lunch: 2 PB sandwiches on wheat >> now prepacked whole meals >45 g cabs >> soup, lunch bowl (up to 45g carbs) - Dinner: low carb >> now: same: 45-50g carbs >> home cooked: steak + mac and cheese or other starch, greens - Snacks: 2 snack a day: 10 am and  pm: nabs, sugar free jello cup, fruit He lost a significant amount of weight before starting insulin: 25-30 lbs.  -No CKD, last BUN/creatinine:  02/06/2022: 14/0.85, GFR 113; glucose 256; ACR 4; Urine Glu 500 10/13/2020: Glucose 246, BUN/creatinine 12/0.84, GFR 114 Lab Results  Component Value Date   BUN 8 08/26/2019   BUN 9 07/15/2017   CREATININE 0.75 08/26/2019   CREATININE 0.75 07/15/2017  On losartan.  - + HL; last set of lipids: 02/06/2022: 159/93/62/80 10/13/2020: 173/103/51/103 Lab Results  Component Value Date   CHOL 145 08/26/2019   HDL 53.10 08/26/2019   LDLCALC 80 08/26/2019   TRIG 59.0 08/26/2019   CHOLHDL 3 08/26/2019  He is not on a statin.  -  last eye exam was in 02/2022: No DR reportedly.   - + numbness and tingling in his feet.  Last foot exam was by Dr. Ardelle Anton 08/19/2022.  B12 levels were normal: Lab Results  Component Value Date   VITAMINB12 458 03/26/2018   VITAMINB12 549 11/17/2015   Low BMD for age:  Reviewed history:  Reviewed the reports of available DXA scans: Z-score Lumbar spine (L1-L4) Femoral neck (FN) 33% distal radius UD radius  11/02/2019 ([facility]) -1.8 RFN: -2.2 LFN: n/a -0.9 n/a  08/13/2016 ([facility]) -2.6 RFN: -2.4 LFN: n/a -0.1 n/a  05/04/2014 -2.1 RFN: -2.2 +0.6 +0.1   He had a left hip fracture in 2018 while [place] [employer].  Vitamin D level was normal at last check: 10/13/2020: Vitamin D 32.6 Lab Results  Component Value Date   VD25OH 52.0 11/02/2019   VD25OH 82.12 03/26/2018    VD25OH 72.84 11/17/2015   Calcitriol level was normal: Component     Latest Ref Rng & Units 10/08/2016  Vitamin D 1, 25 (OH) Total     18 - 72 pg/mL 31  Vitamin D3 1, 25 (OH)     pg/mL reviewed and addended history: 31  Vitamin D2 1, 25 (OH)     pg/mL <8   He continues on calcium 600 mg twice a day and vitamin D 2000 units daily.  We started Fosamax 11/2016.  No side effects.  No weightbearing exercises.  She is not on vitamin A supplements  Pt does have a FH of osteoporosis: MGM.  No hypo or hypercalcemia or hyperparathyroidism.  No history of kidney stones. 02/06/2022: calcium 9.9 (8.7-10.2) 10/13/2020: Calcium 9.9 03/12/2019: calcium 9.4 (8.4-10.2) Lab Results  Component Value Date   PTH 21 10/08/2016   CALCIUM 9.7 08/26/2019   CALCIUM 10.5 07/15/2017   CALCIUM 9.8 10/08/2016   CALCIUM 10.3 12/01/2015   CALCIUM 9.8 11/17/2015   24-hour urine calcium was normal: Component     Latest Ref Rng & Units 10/14/2016  Creatinine, Urine     20 - 370 mg/dL 478  Creatinine, 29F Ur     0.63 - 2.50 g/24 h 1.57  Calcium, Ur     Not estab mg/dL 23  Calcium, 24 hour urine     55 - 300 mg/24 h 299   No thyrotoxicosis.  Reviewed TSH levels: 02/06/2022: TSH 1.380 Lab Results  Component Value Date   TSH 1.13 08/26/2019   TSH 1.35 07/15/2017   TSH 1.26 08/13/2016   Multiple myeloma and celiac disease work-up were negative: Component     Latest Ref Rng & Units 10/08/2016  Protein Urine Random     Not Estab. mg/dL 62.1  Albumin ELP, Urine     % 40.3  Alpha-1-Globulin, U     % 2.1  ALPHA-2-GLOBULIN, U     % 15.1  Beta Globulin, U     % 26.9  Gamma Globulin, U     % 15.7  M Component, Ur     Not Observed % Not Observed  Please Note:      Comment  Antigliadin Abs, IgA     0 - 19 units 2  Transglutaminase IgA     0 - 3 U/mL <2  IgA/Immunoglobulin A, Serum     90 - 386 mg/dL 308   ROS: + See HPI  I reviewed pt's medications, allergies, PMH, social hx, family hx, and  changes were documented in the history of present illness. Otherwise, unchanged from my initial  visit note.  Past Medical History:  Diagnosis Date   Abdominal pain 12/01/2015   Asthma    Blood in urine    Diabetes mellitus    GERD (gastroesophageal reflux disease)    Hyperlipidemia    notes only hx of hyperlipidemia   Loss of weight 12/06/2015   MR (mental retardation), moderate    MRSA (methicillin resistant staph aureus) culture positive    Osteoporosis 08/18/2016   Routine general medical examination at a health care facility 10/15/2012    Past Surgical History:  Procedure Laterality Date   HIP SURGERY     Left   INTRAMEDULLARY (IM) NAIL INTERTROCHANTERIC Left 04/10/2014   INTRAMEDULLARY (IM) NAIL INTERTROCHANTERIC Left 04/10/2014   Procedure: INTRAMEDULLARY (IM) NAIL INTERTROCHANTRIC LEFT HIP;  Surgeon: Sheral Apley, MD;  Location: [facility] OR;  Service: Orthopedics;  Laterality: Left;    Social History   Socioeconomic History   Marital status: Single    Spouse name: Not on file   Number of children: Not on file   Years of education: Not on file   Highest education level: Not on file  Occupational History   Not on file  Tobacco Use   Smoking status: Never   Smokeless tobacco: Never  Substance and Sexual Activity   Alcohol use: No   Drug use: No   Sexual activity: Never  Other Topics Concern   Not on file  Social History Narrative   Not on file   Social Determinants of Health   Financial Resource Strain: Not on file  Food Insecurity: Not on file  Transportation Needs: Not on file  Physical Activity: Not on file  Stress: Not on file  Social Connections: Not on file  Intimate Partner Violence: Not on file    Current Outpatient Medications on File Prior to Visit  Medication Sig Dispense Refill   ACCU-CHEK AVIVA PLUS test strip CHECK BLOOD SUGAR 3x DAILY 300 strip 3   Accu-Chek FastClix Lancets MISC CHECK BLOOD SUGARS 3x DAILY 300 each 3   acetaminophen  (TYLENOL) 500 MG tablet Take 500 mg by mouth every 4 (four) hours as needed (pain; fever >100).      albuterol (VENTOLIN HFA) 108 (90 Base) MCG/ACT inhaler Inhale 2 puffs into the lungs 4 (four) times daily as needed for wheezing or shortness of breath. Shortness of breath/wheezing 1 each 3   alendronate (FOSAMAX) 70 MG tablet TAKE 1 TABLET BY MOUTH EVERY 7 DAYS. TAKE WITH A FULL GLASS OF WATER ON AN EMPTY STOMACH. 12 tablet 3   Calcium Carbonate (CALCIUM 600 PO) Take by mouth 2 (two) times daily.     Cholecalciferol (VITAMIN D) 2000 units CAPS Take by mouth.     diphenhydrAMINE (BENADRYL) 25 MG tablet Take 25-50 mg by mouth every 6 (six) hours as needed (allergic reaction). Take 2 tablets (50 mg) for ingestion of shellfish and seek emergency help.     docusate sodium (COLACE) 100 MG capsule Take 1 capsule (100 mg total) by mouth 2 (two) times daily. Continue this while taking narcotics to help with bowel movements (Patient taking differently: Take 100 mg by mouth 2 (two) times daily. Continue this while taking narcotics to help with bowel movements  PRN) 30 capsule 1   famotidine (PEPCID) 40 MG tablet Take 1 tablet (40 mg total) by mouth at bedtime. 90 tablet 0   fluconazole (DIFLUCAN) 150 MG tablet 1 tab po daily x 3 days then repeat again in 1 week 6 tablet 1  fluticasone (FLONASE) 50 MCG/ACT nasal spray Place 1 spray into both nostrils daily as needed for allergies or rhinitis (PRN). 48 mL 0   gabapentin (NEURONTIN) 100 MG capsule Take 1 capsule (100 mg total) by mouth at bedtime. 90 capsule 0   guaiFENesin-dextromethorphan (ROBITUSSIN DM) 100-10 MG/5ML syrup Take 15 mLs by mouth every 4 (four) hours as needed for cough (cold symptoms). Reported on 08/17/2015     ibuprofen (ADVIL,MOTRIN) 200 MG tablet Take 200 mg by mouth every 4 (four) hours as needed.     insulin glargine (LANTUS SOLOSTAR) 100 UNIT/ML Solostar Pen INJECT 15 UNITS UNDER THE SKIN DAILY IN AM 30 mL 3   Insulin Lispro-aabc (LYUMJEV  KWIKPEN) 100 UNIT/ML KwikPen Inject under skin 9-11 units before dinner 30 mL 3   Insulin Pen Needle (CAREFINE PEN NEEDLES) 32G X 4 MM MISC Use 2x a day 200 each 3   ketoconazole (NIZORAL) 2 % cream Apply 1 application topically daily. 15 g 1   loperamide (IMODIUM A-D) 2 MG tablet Take 2 mg by mouth as needed for diarrhea or loose stools. Take 2 caplets  or 4 tsp (20 mls) liquid by mouth after first loose stool and 1 caplet or 1 tsp (5 mls) liquid by mouth for each subsequent stool as needed; not to exceed 8 caplets or 50 mls in 24 hours     loratadine (CLARITIN) 10 MG tablet Take 1 tablet (10 mg total) by mouth daily. 90 tablet 0   losartan (COZAAR) 25 MG tablet Take 1 tablet (25 mg total) by mouth daily. 90 tablet 3   metFORMIN (GLUCOPHAGE) 1000 MG tablet TAKE 1 TABLET BY MOUTH TWICE DAILY WITH A MEAL. 180 tablet 3   montelukast (SINGULAIR) 10 MG tablet TAKE 1 TABLET BY MOUTH ONCE DAILY. 31 tablet PRN   sitaGLIPtin (JANUVIA) 100 MG tablet TAKE 1 TABLET (100MG ) BY MOUTH ONCE DAILY BEFORE BREAKFAST 30 tablet 11   No current facility-administered medications on file prior to visit.    Allergies  Allergen Reactions   Shellfish Allergy Anaphylaxis   Iohexol     IVP Dye    Family History  Problem Relation Age of Onset   Alcohol abuse Father    Cancer Maternal Grandmother    Hyperlipidemia Maternal Grandmother    Heart disease Maternal Grandmother    Hypertension Maternal Grandmother    Heart disease Maternal Grandfather    Hypertension Maternal Grandfather    Hyperlipidemia Maternal Grandfather    Hyperlipidemia Paternal Grandmother    Heart disease Paternal Grandmother    Hypertension Paternal Grandmother    Kidney disease Paternal Grandmother    Diabetes Paternal Grandmother    Heart disease Paternal Grandfather    Hypertension Paternal Grandfather    Hyperlipidemia Paternal Grandfather    Alcohol abuse Mother    Pt has FH of DM in father and Paternal GPs.  PE: BP 138/82 (BP  Location: Left Arm, Patient Position: Sitting, Cuff Size: Normal)   Pulse 75   Ht 5\' 11"  (1.803 m)   Wt 147 lb 9.6 oz (67 kg)   SpO2 98%   BMI 20.59 kg/m  Wt Readings from Last 3 Encounters:  10/15/22 147 lb 9.6 oz (67 kg)  06/17/22 149 lb 12.8 oz (67.9 kg)  02/15/22 143 lb 3.2 oz (65 kg)   Constitutional: thin, in NAD, thoracic kyphosis with poor posture Eyes:EOMI, no exophthalmos ENT: no cervical lymphadenopathy Cardiovascular: tachycardia, RR, No MRG Respiratory: CTA B Musculoskeletal: no deformities Skin: no rashes Neurological: no  tremor with outstretched hands  ASSESSMENT: 1. DM1.5, insulin-dependent, uncontrolled, without long term complications  We investigated him for type I DM and GAD Abs were positive >> Type 1.5 DM Component     Latest Ref Rng & Units 10/08/2016  Glucose     65 - 99 mg/dL 73  Pancreatic Islet Cell Antibody     <5 JDF Units <5  Glutamic Acid Decarb Ab     <5 IU/mL 14 (H)  C-Peptide     0.80 - 3.85 ng/mL 1.29   2. Low BMD for age  PLAN:  1. Patient with history of uncontrolled type 1.5 diabetes, with improved control during the coronavirus pandemic as he was not able to go out for meals, but worsening control after switching facilities.  At last visit, sugars were quite fluctuating, higher than before, up to 300s especially at night.  There was improvement in the blood sugars in the week prior to the appointment after improving diet and eating more consistent carb portions so we did not drastically change his regimen but I did advise him to take more Lyumjev before a larger meal or if he had dessert after the meal.  HbA1c at that time was 8.3%, higher. -At today's visit, they do not have blood sugars with them as the blood sugar log was not ready when they left the facility.  From what Onalee Hua remembers, he had 2 blood sugars in the 200s recently, presumably after dinner, but unclear.  He also had a low blood sugar, at 59, after biking intensely when he  was visiting family, per history given by arms.  It appears that his blood sugars after dinner may still be high, especially when he is eating meals.  We discussed that in this situation, he needs to be given a slightly higher dose of Lyumjev.  The aunt describes that he is usually given 10 units of insulin at the facility.  Besides increasing the Lyumjev insulin before larger meals, I advised him to continue the same regimen.  He may need a snack if he plans to do if tenuous exercise. - I suggested to:  Patient Instructions  Please continue checking sugars 2x a day: - am - ~2h after dinner  Please continue: - Metformin 1000 mg 2x a day with meals - Januvia 100 mg daily in am - Lantus 15 units in am  - Lyumjev 9-10 units before dinner unless eating out: 12-13 units before a larger meal or if you have dessert after the meal.  Onalee Hua can inject his own insulin.  Call with sugars <70 or >250.  Please continue Fosamax 70 mg weekly.   Please send me the DEXA scan report and most recently vitamin D level.  Please return in 4 months with your sugar log.   - we checked his HbA1c: 7.9% (lower) - advised to check sugars at different times of the day - 2x a day, rotating check times - advised for yearly eye exams >> he is UTD - return to clinic in 4 months  2. Low BMD for age -No falls or fractures since last visit -he had a hip fracture while rollerskating in 2015.  At that time, a bone density scan showed mildly low BMD for age.  We started him on calcium and vitamin D 2000 units daily.  He continues on this now.   -we started Fosamax in 2018 - tolerated well.  Since he has been on it for 5 years, we discussed at the last  visit about taking a drug holiday after the new bone density returns.   -He finally had his bone density scan in 07/2022 at [facility] with [facility] in [place], which is on a different machine.  I do not have the records yet but his aunt will send them to me.  I did  discuss with his aunt at that time that going forward, we have to get the DXA's on the same machine -Calcium and GFR were normal per records from PCP from 02/2022 -if not checked in PCPs office, we will recheck these at next visit.  At that time, he will also need a new vitamin D level.  Patient's aunt cannot remember if he had this done with in the last year and she will look at home and let me know. -Before last visit, he started to use some weight and I strongly advised him to continue.  Aunt: Derald Macleod [address] [place], [place], [address]  Carlus Pavlov, MD PhD [facility]

## 2022-10-16 ENCOUNTER — Encounter: Payer: Self-pay | Admitting: Internal Medicine

## 2022-10-17 ENCOUNTER — Telehealth: Payer: Self-pay

## 2022-10-17 MED ORDER — LYUMJEV KWIKPEN 100 UNIT/ML ~~LOC~~ SOPN: PEN_INJECTOR | SUBCUTANEOUS | 3 refills | Status: DC

## 2022-10-17 NOTE — Telephone Encounter (Signed)
New rx faxed to pharmacy and Okey Regal with the group home at [phone]

## 2022-10-17 NOTE — Telephone Encounter (Signed)
This message goes along with the other MyChart message with the attachments.

## 2022-10-17 NOTE — Telephone Encounter (Signed)
Let's say: 9 units for a small meal 11 units for a regular meal 13 units for a larger meal or if he has dessert after the meal  Would this help? C

## 2022-10-17 NOTE — Telephone Encounter (Signed)
Please see attachments

## 2022-10-17 NOTE — Telephone Encounter (Signed)
Group home nurse called requesting detailed parameters to provide the group home when providing Adam Fitzpatrick insulin. Instructions state 9-13 units of Lyumjev. Insulin is given by unlicensed professionals and they need more information regarding how much to give when.

## 2022-10-18 ENCOUNTER — Other Ambulatory Visit: Payer: Self-pay | Admitting: Internal Medicine

## 2022-10-18 DIAGNOSIS — E139 Other specified diabetes mellitus without complications: Secondary | ICD-10-CM

## 2022-10-18 DIAGNOSIS — M859 Disorder of bone density and structure, unspecified: Secondary | ICD-10-CM

## 2022-10-31 ENCOUNTER — Other Ambulatory Visit (INDEPENDENT_AMBULATORY_CARE_PROVIDER_SITE_OTHER): Payer: 59

## 2022-10-31 DIAGNOSIS — M859 Disorder of bone density and structure, unspecified: Secondary | ICD-10-CM

## 2022-10-31 DIAGNOSIS — E139 Other specified diabetes mellitus without complications: Secondary | ICD-10-CM | POA: Diagnosis not present

## 2022-10-31 LAB — COMPREHENSIVE METABOLIC PANEL
ALT: 10 U/L (ref 0–53)
AST: 14 U/L (ref 0–37)
Albumin: 4.7 g/dL (ref 3.5–5.2)
Alkaline Phosphatase: 45 U/L (ref 39–117)
BUN: 13 mg/dL (ref 6–23)
CO2: 30 mEq/L (ref 19–32)
Calcium: 10.5 mg/dL (ref 8.4–10.5)
Chloride: 99 mEq/L (ref 96–112)
Creatinine, Ser: 0.94 mg/dL (ref 0.40–1.50)
GFR: 101.12 mL/min (ref >60.00)
Glucose, Bld: 214 mg/dL — ABNORMAL HIGH (ref 70–99)
Potassium: 4.5 mEq/L (ref 3.5–5.1)
Sodium: 138 mEq/L (ref 135–145)
Total Bilirubin: 1.3 mg/dL — ABNORMAL HIGH (ref 0.2–1.2)
Total Protein: 7.7 g/dL (ref 6.0–8.3)

## 2022-10-31 LAB — LIPID PANEL
Cholesterol: 181 mg/dL (ref 0–200)
HDL: 67.1 mg/dL (ref >39.00)
LDL Cholesterol: 94 mg/dL (ref 0–99)
NonHDL: 114.33
Total CHOL/HDL Ratio: 3
Triglycerides: 101 mg/dL (ref 0.0–149.0)
VLDL: 20.2 mg/dL (ref 0.0–40.0)

## 2022-10-31 LAB — MICROALBUMIN / CREATININE URINE RATIO
Creatinine,U: 50.7 mg/dL
Microalb Creat Ratio: 1.4 mg/g (ref 0.0–30.0)
Microalb, Ur: <0.7 mg/dL (ref 0.0–1.9)

## 2022-10-31 LAB — VITAMIN D 25 HYDROXY (VIT D DEFICIENCY, FRACTURES): VITD: 62.97 ng/mL (ref 30.00–100.00)

## 2022-11-01 DIAGNOSIS — R739 Hyperglycemia, unspecified: Secondary | ICD-10-CM | POA: Diagnosis not present

## 2023-02-11 ENCOUNTER — Other Ambulatory Visit: Payer: Self-pay

## 2023-02-11 DIAGNOSIS — E139 Other specified diabetes mellitus without complications: Secondary | ICD-10-CM

## 2023-02-11 MED ORDER — METFORMIN HCL 1000 MG PO TABS
ORAL_TABLET | ORAL | 1 refills | Status: DC
Start: 2023-02-11 — End: 2023-08-04

## 2023-03-06 DIAGNOSIS — E109 Type 1 diabetes mellitus without complications: Secondary | ICD-10-CM | POA: Diagnosis not present

## 2023-03-06 DIAGNOSIS — H40053 Ocular hypertension, bilateral: Secondary | ICD-10-CM | POA: Diagnosis not present

## 2023-03-11 ENCOUNTER — Encounter: Payer: Self-pay | Admitting: Podiatry

## 2023-03-11 ENCOUNTER — Ambulatory Visit (INDEPENDENT_AMBULATORY_CARE_PROVIDER_SITE_OTHER): Payer: 59 | Admitting: Podiatry

## 2023-03-11 ENCOUNTER — Encounter: Payer: Self-pay | Admitting: Internal Medicine

## 2023-03-11 ENCOUNTER — Ambulatory Visit: Payer: 59 | Admitting: Podiatry

## 2023-03-11 ENCOUNTER — Ambulatory Visit (INDEPENDENT_AMBULATORY_CARE_PROVIDER_SITE_OTHER): Payer: 59 | Admitting: Internal Medicine

## 2023-03-11 VITALS — BP 122/70 | HR 65 | Ht 71.0 in | Wt 144.6 lb

## 2023-03-11 DIAGNOSIS — B351 Tinea unguium: Secondary | ICD-10-CM | POA: Diagnosis not present

## 2023-03-11 DIAGNOSIS — M79674 Pain in right toe(s): Secondary | ICD-10-CM | POA: Diagnosis not present

## 2023-03-11 DIAGNOSIS — E1149 Type 2 diabetes mellitus with other diabetic neurological complication: Secondary | ICD-10-CM

## 2023-03-11 DIAGNOSIS — E1065 Type 1 diabetes mellitus with hyperglycemia: Secondary | ICD-10-CM

## 2023-03-11 DIAGNOSIS — M859 Disorder of bone density and structure, unspecified: Secondary | ICD-10-CM | POA: Diagnosis not present

## 2023-03-11 DIAGNOSIS — M79675 Pain in left toe(s): Secondary | ICD-10-CM | POA: Diagnosis not present

## 2023-03-11 DIAGNOSIS — E139 Other specified diabetes mellitus without complications: Secondary | ICD-10-CM

## 2023-03-11 LAB — POCT GLYCOSYLATED HEMOGLOBIN (HGB A1C): Hemoglobin A1C: 6.6 % — AB (ref 4.0–5.6)

## 2023-03-11 LAB — BASIC METABOLIC PANEL
BUN: 10 mg/dL (ref 6–23)
CO2: 31 meq/L (ref 19–32)
Calcium: 10.4 mg/dL (ref 8.4–10.5)
Chloride: 102 meq/L (ref 96–112)
Creatinine, Ser: 0.73 mg/dL (ref 0.40–1.50)
GFR: 113.21 mL/min (ref >60.00)
Glucose, Bld: 159 mg/dL — ABNORMAL HIGH (ref 70–99)
Potassium: 4.2 meq/L (ref 3.5–5.1)
Sodium: 141 meq/L (ref 135–145)

## 2023-03-11 MED ORDER — GLUCOSE BLOOD VI STRP: ORAL_STRIP | 12 refills | Status: AC

## 2023-03-11 MED ORDER — LYUMJEV KWIKPEN 100 UNIT/ML ~~LOC~~ SOPN: PEN_INJECTOR | SUBCUTANEOUS | 3 refills | Status: DC

## 2023-03-11 MED ORDER — PRODIGY AUTOCODE BLOOD GLUCOSE W/DEVICE KIT: PACK | 0 refills | Status: DC

## 2023-03-11 NOTE — Patient Instructions (Addendum)
Please continue checking sugars 2x a day: - am - ~2h after dinner  Please continue: - Metformin 1000 mg 2x a day with meals - Januvia 100 mg daily in am - Lantus 15 units in am   Change: - Lyumjev: 7 units for a small meal 9 units for a regular meal 11 units for a larger meal or if he has dessert after the meal  Onalee Hua can inject his own insulin.  Call with sugars <70 or >250.  Start Reclast. Stop Fosamax when starting Reclast.  Please stop at the lab.  Please return in 4 months with your sugar log.

## 2023-03-11 NOTE — Progress Notes (Signed)
This patient returns to the office for evaluation and treatment of long thick painful nails .  This patient is unable to trim his own nails since the patient cannot reach his feet.  Patient says the nails are painful walking and wearing his shoes.  He returns for preventive foot care services.  General Appearance  Alert, conversant and in no acute stress.  Vascular  Dorsalis pedis and posterior tibial  pulses are palpable  bilaterally.  Capillary return is within normal limits  bilaterally. Temperature is within normal limits  bilaterally.  Neurologic  Senn-Weinstein monofilament wire test within normal limits  bilaterally. Muscle power within normal limits bilaterally.  Nails Thick disfigured discolored nails with subungual debris hallux nails   bilaterally. No evidence of bacterial infection or drainage bilaterally.  Orthopedic  No limitations of motion  feet .  No crepitus or effusions noted.  No bony pathology or digital deformities noted.  Skin  normotropic skin with no porokeratosis noted bilaterally.  No signs of infections or ulcers noted.     Onychomycosis  Pain in toes right foot  Pain in toes left foot  Debridement  of nails  1-5  B/L with a nail nipper.  Nails were then filed using a dremel tool with no incidents.    RTC  3 months   Gardiner Barefoot DPM

## 2023-03-11 NOTE — Progress Notes (Signed)
Patient ID: Adam Fitzpatrick, adult   DOB: May 18, 1981, 42 y.o.   MRN: [id number]   HPI: Adam Fitzpatrick) is a 42 y.o. man, returning for f/u for DM1.5, dx as DM2 in 2011, insulin-dependent, uncontrolled, without long-term complications and low BMD for age.   Today, he comes with his aunt, who offers part of the history especially regarding blood sugars, medications, and pt's activity.  He last visit was 5 months ago.  Interim history: He denies blurry vision/disequilibrium/falls/fractures since last visit.  No increased urination, blurry vision, nausea, chest pain. He presented to the emergency room soon after our last visit with hyperglycemia: Electronically signed by Quitman Livings PA-C at 11/01/2022 7:54 PM EDT  Reason for Visit  Reason for Visit - Reason Comments  Hyperglycemia Took glucose level approx 4:15pm it was 424. Ate pizza and a TV that had potato rounds in it. Still feeling clammy and dizzy. Takes insulin QHS and oral medications QD.  Sugars improved afterwards. He continues to do strength training exercises and riding his bike.  DM 1.5:  Reviewed HbA1c levels: Lab Results  Component Value Date   HGBA1C 7.9 (A) 10/15/2022   HGBA1C 8.3 (A) 06/17/2022   HGBA1C 7.3 (A) 02/15/2022   HGBA1C 8.6 (A) 10/12/2021   HGBA1C 7.4 (A) 06/15/2021   HGBA1C 8.1 (A) 03/13/2021   HGBA1C 8.4 (A) 11/29/2020   HGBA1C 9.2 (A) 10/03/2020   HGBA1C 6.1 (A) 11/02/2019   HGBA1C 6.4 08/26/2019  02/06/2022: HbA1c 7.6%  He is on: - Metformin 1000 mg 2x a day, with meals >> 2000 mg with dinner >> 1000 mg 2x a day with meals - Lyumjev 9-10 units before dinner unless eating out: 12-13 units before a larger meal or if you have dessert after the meal >>  9 units for a small meal 11 units for a regular meal 13 units for a larger meal or if he has dessert after the meal - Onglyza 5 mg daily in am >>  Januvia 100 mg daily in a.m. - Lantus 9 >> 11 >> 15 units at bedtime >> in am He had chest  discomfort with glipizide.  His CBGs are checked 2x a day at the facility: - am:  77-127, 141 >> 70, 103-198 >> 60, 69-139, 157, 213 - 2h after b'fast: n/c >> 138, 145 >> n/c - before lunch: n/c >> 145 >> n/c - 2h after lunch: n/c >> 128 >> n/c - before dinner: n/c >> 138 >> n/c >> 118-172 - 2h after dinner: 55, 61, 85-302 >> 66, 97-322 >> 51, 68, 84-168, 234 - bedtime: n/c - nighttime: n/c Lowest sugar was 58 >> 58 (exercising) >> 51; it is unclear at which level he has hypoglycemia awareness. Highest sugar was 322 >> 281 >> 234.  Glucometer: AccuChek Aviva  Pt's meals are: - Breakfast:  cereal: cheerios + 2% milk >> bacon + eggs, 1-2 waffle; b'fast burritos - Lunch: 2 PB sandwiches on wheat >> now prepacked whole meals >45 g cabs >> soup, lunch bowl (up to 45g carbs) - Dinner: low carb >> now: same: 45-50g carbs >> home cooked: steak + mac and cheese or other starch, greens - Snacks: 2 snack a day: 10 am and  pm: nabs, sugar free jello cup, fruit He lost a significant amount of weight before starting insulin: 25-30 lbs.  -No CKD, last BUN/creatinine:  Lab Results  Component Value Date   BUN 13 10/31/2022   BUN 8 08/26/2019   CREATININE  0.94 10/31/2022   CREATININE 0.75 08/26/2019   Lab Results  Component Value Date   MICRALBCREAT 1.4 10/31/2022   MICRALBCREAT 0.7 10/23/2011  On losartan.  - + HL; last set of lipids: Lab Results  Component Value Date   CHOL 181 10/31/2022   HDL 67.10 10/31/2022   LDLCALC 94 10/31/2022   TRIG 101.0 10/31/2022   CHOLHDL 3 10/31/2022  He is not on a statin.  - last eye exam was in 03/2023: No DR reportedly.   - + numbness and tingling in his feet.  Last foot exam was by Dr. Ardelle Anton 08/19/2022.  B12 levels were normal: Lab Results  Component Value Date   VITAMINB12 458 03/26/2018   VITAMINB12 549 11/17/2015   Low BMD for age:  Reviewed history:  Reviewed the reports of available DXA scans: Z-score Lumbar spine (L1-L4) Femoral  neck (FN) 33% distal radius UD radius  07/19/2022 [facility] [place] [place]) -1.2 RFN: -2.6 LFN: n/a N/a N/a  11/02/2019 ([facility]) -1.8 RFN: -2.2 LFN: n/a -0.9 n/a  08/13/2016 ([facility]) -2.6 RFN: -2.4 LFN: n/a -0.1 n/a  05/04/2014 -2.1 RFN: -2.2 +0.6 +0.1   He had a left hip fracture in 2018 while rollersk[place] [employer].  Vitamin D level was normal at last check: Lab Results  Component Value Date   VD25OH 62.97 10/31/2022   VD25OH 52.0 11/02/2019   VD25OH 82.12 03/26/2018   VD25OH 72.84 11/17/2015  10/13/2020: Vitamin D 32.6  Calcitriol level was normal: Component     Latest Ref Rng & Units 10/08/2016  Vitamin D 1, 25 (OH) Total     18 - 72 pg/mL 31  Vitamin D3 1, 25 (OH)     pg/mL reviewed and addended history: 31  Vitamin D2 1, 25 (OH)     pg/mL <8   He continues on calcium 600 mg twice a day and vitamin D 2000 units daily.  We started Fosamax 11/2016.  No side effects.  In 10/2022, after latest DXA results returned, I suggested Reclast.  No weightbearing exercises.  She is not on vitamin A supplements  Pt does have a FH of osteoporosis: MGM.  No hypo or hypercalcemia or hyperparathyroidism.  No history of kidney stones. 02/06/2022: calcium 9.9 (8.7-10.2) 10/13/2020: Calcium 9.9 03/12/2019: calcium 9.4 (8.4-10.2) Lab Results  Component Value Date   PTH 21 10/08/2016   CALCIUM 10.5 10/31/2022   CALCIUM 9.7 08/26/2019   CALCIUM 10.5 07/15/2017   CALCIUM 9.8 10/08/2016   CALCIUM 10.3 12/01/2015   24-hour urine calcium was normal: Component     Latest Ref Rng & Units 10/14/2016  Creatinine, Urine     20 - 370 mg/dL 161  Creatinine, 09U Ur     0.63 - 2.50 g/24 h 1.57  Calcium, Ur     Not estab mg/dL 23  Calcium, 24 hour urine     55 - 300 mg/24 h 299   No thyrotoxicosis.  Reviewed TSH levels: 02/06/2022: TSH 1.380 Lab Results  Component Value Date   TSH 1.13 08/26/2019   TSH 1.35 07/15/2017   TSH 1.26 08/13/2016   Multiple myeloma  and celiac disease work-up were negative: Component     Latest Ref Rng & Units 10/08/2016  Protein Urine Random     Not Estab. mg/dL 04.5  Albumin ELP, Urine     % 40.3  Alpha-1-Globulin, U     % 2.1  ALPHA-2-GLOBULIN, U     % 15.1  Beta Globulin, U     %  26.9  Gamma Globulin, U     % 15.7  M Component, Ur     Not Observed % Not Observed  Please Note:      Comment  Antigliadin Abs, IgA     0 - 19 units 2  Transglutaminase IgA     0 - 3 U/mL <2  IgA/Immunoglobulin A, Serum     90 - 386 mg/dL 161   ROS: + See HPI  I reviewed pt's medications, allergies, PMH, social hx, family hx, and changes were documented in the history of present illness. Otherwise, unchanged from my initial visit note.  Past Medical History:  Diagnosis Date   Abdominal pain 12/01/2015   Asthma    Blood in urine    Diabetes mellitus    GERD (gastroesophageal reflux disease)    Hyperlipidemia    notes only hx of hyperlipidemia   Loss of weight 12/06/2015   MR (mental retardation), moderate    MRSA (methicillin resistant staph aureus) culture positive    Osteoporosis 08/18/2016   Routine general medical examination at a health care facility 10/15/2012    Past Surgical History:  Procedure Laterality Date   HIP SURGERY     Left   INTRAMEDULLARY (IM) NAIL INTERTROCHANTERIC Left 04/10/2014   INTRAMEDULLARY (IM) NAIL INTERTROCHANTERIC Left 04/10/2014   Procedure: INTRAMEDULLARY (IM) NAIL INTERTROCHANTRIC LEFT HIP;  Surgeon: Sheral Apley, MD;  Location: [facility] OR;  Service: Orthopedics;  Laterality: Left;    Social History   Socioeconomic History   Marital status: Single    Spouse name: Not on file   Number of children: Not on file   Years of education: Not on file   Highest education level: Not on file  Occupational History   Not on file  Tobacco Use   Smoking status: Never   Smokeless tobacco: Never  Substance and Sexual Activity   Alcohol use: No   Drug use: No   Sexual activity: Never   Other Topics Concern   Not on file  Social History Narrative   Not on file   Social Determinants of Health   Financial Resource Strain: Low Risk  (08/09/2022)   Received from [facility], [facility]   Overall Financial Resource Strain (CARDIA)    Difficulty of Paying Living Expenses: Not hard at all  Food Insecurity: No Food Insecurity (08/09/2022)   Received from [facility], [facility]   Hunger Vital Sign    Worried About Running Out of Food in the Last Year: Never true    Ran Out of Food in the Last Year: Never true  Transportation Needs: No Transportation Needs (08/09/2022)   Received from [facility], [facility]   PRAPARE - Transportation    Lack of Transportation (Medical): No    Lack of Transportation (Non-Medical): No  Physical Activity: Unknown (02/06/2022)   Received from [facility], [facility]   Exercise Vital Sign    Days of Exercise per Week: 0 days    Minutes of Exercise per Session: Not on file  Stress: No Stress Concern Present (02/06/2022)   Received from [facility], [facility] - Occupational Stress Questionnaire    Feeling of Stress : Not at all  Social Connections: Unknown (02/12/2023)   Received from [facility]   Social Network    Social Network: Not on file  Intimate Partner Violence: Unknown (02/12/2023)   Received from [facility]   HITS    Physically Hurt: Not  on file    Insult or Talk Down To: Not on file    Threaten Physical Harm: Not on file    Scream or Curse: Not on file    Current Outpatient Medications on File Prior to Visit  Medication Sig Dispense Refill   ACCU-CHEK AVIVA PLUS test strip CHECK BLOOD SUGAR 3x DAILY 300 strip 3   Accu-Chek FastClix Lancets MISC CHECK BLOOD SUGARS 3x DAILY 300 each 3   acetaminophen (TYLENOL) 500 MG tablet Take 500 mg by mouth every 4 (four) hours as needed (pain; fever >100).      albuterol (VENTOLIN HFA) 108 (90 Base) MCG/ACT inhaler Inhale  2 puffs into the lungs 4 (four) times daily as needed for wheezing or shortness of breath. Shortness of breath/wheezing 1 each 3   alendronate (FOSAMAX) 70 MG tablet TAKE 1 TABLET BY MOUTH EVERY 7 DAYS. TAKE WITH A FULL GLASS OF WATER ON AN EMPTY STOMACH. 12 tablet 3   Calcium Carbonate (CALCIUM 600 PO) Take by mouth 2 (two) times daily.     Cholecalciferol (VITAMIN D) 2000 units CAPS Take by mouth.     diphenhydrAMINE (BENADRYL) 25 MG tablet Take 25-50 mg by mouth every 6 (six) hours as needed (allergic reaction). Take 2 tablets (50 mg) for ingestion of shellfish and seek emergency help.     docusate sodium (COLACE) 100 MG capsule Take 1 capsule (100 mg total) by mouth 2 (two) times daily. Continue this while taking narcotics to help with bowel movements (Patient taking differently: Take 100 mg by mouth 2 (two) times daily. Continue this while taking narcotics to help with bowel movements  PRN) 30 capsule 1   famotidine (PEPCID) 40 MG tablet Take 1 tablet (40 mg total) by mouth at bedtime. 90 tablet 0   fluconazole (DIFLUCAN) 150 MG tablet 1 tab po daily x 3 days then repeat again in 1 week 6 tablet 1   fluticasone (FLONASE) 50 MCG/ACT nasal spray Place 1 spray into both nostrils daily as needed for allergies or rhinitis (PRN). 48 mL 0   gabapentin (NEURONTIN) 100 MG capsule Take 1 capsule (100 mg total) by mouth at bedtime. 90 capsule 0   guaiFENesin-dextromethorphan (ROBITUSSIN DM) 100-10 MG/5ML syrup Take 15 mLs by mouth every 4 (four) hours as needed for cough (cold symptoms). Reported on 08/17/2015     ibuprofen (ADVIL,MOTRIN) 200 MG tablet Take 200 mg by mouth every 4 (four) hours as needed.     insulin glargine (LANTUS SOLOSTAR) 100 UNIT/ML Solostar Pen INJECT 15 UNITS UNDER THE SKIN DAILY IN AM 30 mL 3   Insulin Lispro-aabc (LYUMJEV KWIKPEN) 100 UNIT/ML KwikPen Inject under skin 9-13 units before dinner 30 mL 3   Insulin Pen Needle (CAREFINE PEN NEEDLES) 32G X 4 MM MISC Use 2x a day 200 each 3    ketoconazole (NIZORAL) 2 % cream Apply 1 application topically daily. 15 g 1   loperamide (IMODIUM A-D) 2 MG tablet Take 2 mg by mouth as needed for diarrhea or loose stools. Take 2 caplets  or 4 tsp (20 mls) liquid by mouth after first loose stool and 1 caplet or 1 tsp (5 mls) liquid by mouth for each subsequent stool as needed; not to exceed 8 caplets or 50 mls in 24 hours     loratadine (CLARITIN) 10 MG tablet Take 1 tablet (10 mg total) by mouth daily. 90 tablet 0   losartan (COZAAR) 25 MG tablet Take 1 tablet (25 mg total) by mouth daily. 90  tablet 3   metFORMIN (GLUCOPHAGE) 1000 MG tablet TAKE 1 TABLET BY MOUTH TWICE DAILY WITH A MEAL. 180 tablet 1   montelukast (SINGULAIR) 10 MG tablet TAKE 1 TABLET BY MOUTH ONCE DAILY. 31 tablet PRN   sitaGLIPtin (JANUVIA) 100 MG tablet TAKE 1 TABLET (100MG ) BY MOUTH ONCE DAILY BEFORE BREAKFAST 30 tablet 11   No current facility-administered medications on file prior to visit.    Allergies  Allergen Reactions   Shellfish Allergy Anaphylaxis   Iohexol     IVP Dye    Family History  Problem Relation Age of Onset   Alcohol abuse Father    Cancer Maternal Grandmother    Hyperlipidemia Maternal Grandmother    Heart disease Maternal Grandmother    Hypertension Maternal Grandmother    Heart disease Maternal Grandfather    Hypertension Maternal Grandfather    Hyperlipidemia Maternal Grandfather    Hyperlipidemia Paternal Grandmother    Heart disease Paternal Grandmother    Hypertension Paternal Grandmother    Kidney disease Paternal Grandmother    Diabetes Paternal Grandmother    Heart disease Paternal Grandfather    Hypertension Paternal Grandfather    Hyperlipidemia Paternal Grandfather    Alcohol abuse Mother    Pt has FH of DM in father and Paternal GPs.  PE: BP 122/70   Pulse 65   Ht 5\' 11"  (1.803 m)   Wt 144 lb 9.6 oz (65.6 kg)   SpO2 99%   BMI 20.17 kg/m  Wt Readings from Last 3 Encounters:  10/15/22 147 lb 9.6 oz (67 kg)   06/17/22 149 lb 12.8 oz (67.9 kg)  02/15/22 143 lb 3.2 oz (65 kg)   Constitutional: thin, in NAD, thoracic kyphosis with poor posture Eyes:EOMI, no exophthalmos ENT: no cervical lymphadenopathy Cardiovascular: RRR, No MRG Respiratory: CTA B Musculoskeletal: no deformities Skin: no rashes Neurological: no tremor with outstretched hands  ASSESSMENT: 1. 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 Low BMD for age  PLAN:  1. Patient with history of uncontrolled type 1.5 diabetes (LADA), with improved control during the coronavirus pandemic as he was not able to go up for meals, but worsening control after switching facilities.  At last visit, he did not have the blood sugars with him at the blood sugar log was not ready when he left the facility.  From what they have been remembered, he had 2 blood sugars in the 200s recently, presumably after dinner but it was unclear.  He also had a low blood sugar at 59, after biking intensely when he was visiting family.  It appeared that his blood sugars after dinner was higher if he was having a larger meal so I advised him to use a higher dose of Lyumjev before such meals.  The and describes that he was usually given 10 units of insulin at the facility and I advised him to take 12 to 13 units for larger dinner. -At today's visit, after his hyperglycemic episode from 5 months ago, sugars have  improved and he even had some lows in the 50s and 60s now.  These usually happen after dinner but occasionally also in the morning.  Otherwise, sugars are mostly at goal with a hyperglycemic spike  usually on Saturday night when he is going home.  For now, my suggestion was to reduce his Lyumjev insulin doses before dinner, but we can continue the rest of the regimen. - I suggested to:  Patient Instructions  Please continue checking sugars 2x a day: - am - ~2h after dinner  Please continue: - Metformin 1000 mg 2x a day with meals - Januvia 100 mg daily in am - Lantus 15 units in am   Change: - Lyumjev: 7 units for a small meal 9 units for a regular meal 11 units for a larger meal or if he has dessert after the meal  Adam Fitzpatrick can inject his own insulin.  Call with sugars <70 or >250.  Start Reclast. Stop Fosamax when starting Reclast.  Please stop at the lab.  Please return in 4 months with your sugar log.   - we checked his HbA1c: 6.6% (improved) - advised to check sugars at different times of the day - 2x a day, rotating check times - advised for yearly eye exams >> he is UTD - return to clinic in 4 months  2. Low BMD for age - no falls or fractures since last visit -he had a hip fracture while rollerskating in 2015.  At that time, a bone density scan showed mildly low BMD for age.  We started calcium and vitamin D 2000 units daily.  He continues on this now.  His vitamin D level was normal in 10/2022. -We also started Fosamax in 2019.  He tolerates it well.  At previous visits we discussed about continuing this for 5 years, after which I suggested a drug holiday after the new bone density results returned -He finally had his bone density scan in 07/2022 at [facility] with [facility] in [place], which is on a different machine.  I did discuss with his aunt that going forward, to have the bone density performed at the same DXA machine.  His Z-score was improved at the level of the spine, however, it was worse at the level of the right femoral neck.  At that point, I suggested Reclast.  They did not decide for this yet, but agreed to start it going forward.  I advised  him to stop Fosamax when starting Reclast. -We reviewed his calcium and GFR from 10/2022 and these were normal.  We will repeat these today in preparation for Reclast infusion. -I advised him to continue strength training exercises. -Plan to repeat another bone density scan in 1.5 years after starting Reclast.  Plan to continue Reclast for 2 to 3 years.  Component     Latest Ref Rng 03/11/2023  Hemoglobin A1C     4.0 - 5.6 % 6.6 !   Sodium     135 - 145 mEq/L 141   Potassium     3.5 - 5.1 mEq/L 4.2   Chloride     96 - 112 mEq/L 102   CO2     19 - 32 mEq/L 31   Glucose     70 - 99 mg/dL 161 (H)   BUN     6 - 23 mg/dL 10   Creatinine     0.96 - 1.50 mg/dL 0.45   GFR     >40.98 mL/min 113.21  Calcium     8.4 - 10.5 mg/dL 69.6     Calcium and kidney function are normal.  Will put the order in for Reclast.   Aunt: Derald Macleod [address] [place], [place], [address]  Carlus Pavlov, MD PhD [facility]

## 2023-03-12 ENCOUNTER — Telehealth: Payer: Self-pay | Admitting: Pharmacy Technician

## 2023-03-12 ENCOUNTER — Encounter: Payer: Self-pay | Admitting: Internal Medicine

## 2023-03-12 DIAGNOSIS — M810 Age-related osteoporosis without current pathological fracture: Secondary | ICD-10-CM | POA: Insufficient documentation

## 2023-03-12 NOTE — Telephone Encounter (Signed)
Dr. Elvera Lennox,  Patient will be scheduled as soon as possible.  Auth Submission: NO AUTH NEEDED Site of care: Site of care: [facility] Payer: [employer] MEDICARE Medication & CPT/J Code(s) submitted: Reclast (Zolendronic acid) [id number] Route of submission (phone, fax, portal):  Phone # Fax # Auth type: Buy/Bill PB Units/visits requested: X1 Reference number:  Approval from: 03/12/23 to 06/03/23

## 2023-03-13 ENCOUNTER — Encounter: Payer: Self-pay | Admitting: Internal Medicine

## 2023-03-18 DIAGNOSIS — G8929 Other chronic pain: Secondary | ICD-10-CM | POA: Diagnosis not present

## 2023-03-18 DIAGNOSIS — M25561 Pain in right knee: Secondary | ICD-10-CM | POA: Diagnosis not present

## 2023-03-18 DIAGNOSIS — E139 Other specified diabetes mellitus without complications: Secondary | ICD-10-CM | POA: Diagnosis not present

## 2023-03-18 DIAGNOSIS — M859 Disorder of bone density and structure, unspecified: Secondary | ICD-10-CM | POA: Diagnosis not present

## 2023-03-18 DIAGNOSIS — M25562 Pain in left knee: Secondary | ICD-10-CM | POA: Diagnosis not present

## 2023-03-18 DIAGNOSIS — K219 Gastro-esophageal reflux disease without esophagitis: Secondary | ICD-10-CM | POA: Diagnosis not present

## 2023-03-18 DIAGNOSIS — Z23 Encounter for immunization: Secondary | ICD-10-CM | POA: Diagnosis not present

## 2023-03-20 ENCOUNTER — Ambulatory Visit: Payer: 59

## 2023-03-20 DIAGNOSIS — M810 Age-related osteoporosis without current pathological fracture: Secondary | ICD-10-CM

## 2023-03-24 ENCOUNTER — Ambulatory Visit: Payer: 59

## 2023-03-24 VITALS — BP 116/79 | HR 81 | Temp 98.8°F | Resp 16 | Ht 71.0 in | Wt 141.2 lb

## 2023-03-24 DIAGNOSIS — M810 Age-related osteoporosis without current pathological fracture: Secondary | ICD-10-CM | POA: Diagnosis not present

## 2023-03-24 MED ORDER — ZOLEDRONIC ACID 5 MG/100ML IV SOLN
5.0000 mg | Freq: Once | INTRAVENOUS | Status: AC
  Administered 2023-03-24: 5 mg via INTRAVENOUS
  Filled 2023-03-24: qty 100

## 2023-03-24 MED ORDER — DIPHENHYDRAMINE HCL 25 MG PO CAPS
25.0000 mg | ORAL_CAPSULE | Freq: Once | ORAL | Status: AC
  Administered 2023-03-24: 25 mg via ORAL
  Filled 2023-03-24: qty 1

## 2023-03-24 MED ORDER — ACETAMINOPHEN 325 MG PO TABS
650.0000 mg | ORAL_TABLET | Freq: Once | ORAL | Status: DC
Start: 2023-03-24 — End: 2023-03-24

## 2023-03-24 NOTE — Progress Notes (Signed)
Diagnosis: Osteoporosis  Provider:  Chilton Greathouse MD  Procedure: IV Infusion  IV Type: Peripheral, IV Location: R Forearm  Reclast (Zolendronic Acid), Dose: 5 mg  Infusion Start Time: 1204  Infusion Stop Time: 1233  Post Infusion IV Care: Observation period completed and Peripheral IV Discontinued  Discharge: Condition: Good, Destination: Home . AVS Provided  Performed by:  Wyvonne Lenz, RN

## 2023-04-08 ENCOUNTER — Encounter: Payer: Self-pay | Admitting: Internal Medicine

## 2023-04-09 ENCOUNTER — Other Ambulatory Visit: Payer: Self-pay

## 2023-04-09 DIAGNOSIS — Z794 Long term (current) use of insulin: Secondary | ICD-10-CM

## 2023-04-09 MED ORDER — BLOOD GLUCOSE MONITORING SUPPL DEVI: 1.0000 | Freq: Once | 0 refills | Status: AC

## 2023-05-09 ENCOUNTER — Telehealth: Payer: Self-pay

## 2023-05-09 NOTE — Telephone Encounter (Signed)
Received a call from Lorian with [facility].  She is needing clarification for the Lyumjev. Per your last note:- Lyumjev: 7 units for a small meal 9 units for a regular meal 11 units for a larger meal or if he has dessert after the meal   In their system they need to have time frames and with these instructions they need to know what time to give certain units.   Are we classifying Small meal as breakfast, Lunch is regular and dinner is Larger?   Fax #: [phone]

## 2023-05-14 NOTE — Telephone Encounter (Signed)
Tried calling Lorian back , unable to leave a vm (mailbox full)

## 2023-05-14 NOTE — Telephone Encounter (Addendum)
Blank letter has been typed and faxed.

## 2023-06-16 ENCOUNTER — Telehealth: Payer: Self-pay

## 2023-06-16 NOTE — Telephone Encounter (Signed)
 Received a call from [place] and she states that the people whom are working at the group home where this patient is currently located. They need to have specifics (like a sliding scale) for his Lyumjev  [phone] fax #

## 2023-06-16 NOTE — Telephone Encounter (Signed)
 OK. Let's use this: - 150-175: + 1 unit  - 176-200: + 2 units  - 201-225: + 3 units  - 226-250: + 4 units  >250: + 5 units

## 2023-06-17 NOTE — Telephone Encounter (Signed)
 Letter has been Typed with the details.

## 2023-06-17 NOTE — Telephone Encounter (Signed)
 Faxed

## 2023-06-19 ENCOUNTER — Other Ambulatory Visit: Payer: Self-pay | Admitting: Internal Medicine

## 2023-06-26 DIAGNOSIS — K625 Hemorrhage of anus and rectum: Secondary | ICD-10-CM | POA: Diagnosis not present

## 2023-06-26 LAB — CMP 10231: EGFR: 90

## 2023-07-04 ENCOUNTER — Telehealth: Payer: Self-pay

## 2023-07-04 ENCOUNTER — Other Ambulatory Visit: Payer: Self-pay | Admitting: Internal Medicine

## 2023-07-04 DIAGNOSIS — E139 Other specified diabetes mellitus without complications: Secondary | ICD-10-CM

## 2023-07-04 MED ORDER — SITAGLIPTIN PHOSPHATE 100 MG PO TABS: ORAL_TABLET | ORAL | 3 refills | Status: DC

## 2023-07-04 NOTE — Telephone Encounter (Signed)
Requested Prescriptions   Signed Prescriptions Disp Refills   sitaGLIPtin (JANUVIA) 100 MG tablet 90 tablet 3    Sig: TAKE 1 TABLET (100MG ) BY MOUTH ONCE DAILY BEFORE BREAKFAST    Authorizing Provider: Carlus Pavlov    Ordering User: Pollie Meyer

## 2023-07-05 DIAGNOSIS — R42 Dizziness and giddiness: Secondary | ICD-10-CM | POA: Diagnosis not present

## 2023-07-05 DIAGNOSIS — Z91013 Allergy to seafood: Secondary | ICD-10-CM | POA: Diagnosis not present

## 2023-07-05 DIAGNOSIS — Z7984 Long term (current) use of oral hypoglycemic drugs: Secondary | ICD-10-CM | POA: Diagnosis not present

## 2023-07-05 DIAGNOSIS — Z794 Long term (current) use of insulin: Secondary | ICD-10-CM | POA: Diagnosis not present

## 2023-07-05 DIAGNOSIS — I10 Essential (primary) hypertension: Secondary | ICD-10-CM | POA: Diagnosis not present

## 2023-07-05 DIAGNOSIS — R531 Weakness: Secondary | ICD-10-CM | POA: Diagnosis not present

## 2023-07-05 DIAGNOSIS — E1065 Type 1 diabetes mellitus with hyperglycemia: Secondary | ICD-10-CM | POA: Diagnosis not present

## 2023-07-05 DIAGNOSIS — Z79899 Other long term (current) drug therapy: Secondary | ICD-10-CM | POA: Diagnosis not present

## 2023-07-05 DIAGNOSIS — Z87892 Personal history of anaphylaxis: Secondary | ICD-10-CM | POA: Diagnosis not present

## 2023-07-05 DIAGNOSIS — R739 Hyperglycemia, unspecified: Secondary | ICD-10-CM | POA: Diagnosis not present

## 2023-07-09 ENCOUNTER — Encounter: Payer: Self-pay | Admitting: Internal Medicine

## 2023-07-14 ENCOUNTER — Ambulatory Visit (INDEPENDENT_AMBULATORY_CARE_PROVIDER_SITE_OTHER): Payer: 59 | Admitting: Internal Medicine

## 2023-07-14 ENCOUNTER — Encounter: Payer: Self-pay | Admitting: Internal Medicine

## 2023-07-14 VITALS — BP 110/60 | HR 89 | Ht 71.0 in | Wt 142.8 lb

## 2023-07-14 DIAGNOSIS — E109 Type 1 diabetes mellitus without complications: Secondary | ICD-10-CM

## 2023-07-14 DIAGNOSIS — M859 Disorder of bone density and structure, unspecified: Secondary | ICD-10-CM

## 2023-07-14 DIAGNOSIS — E139 Other specified diabetes mellitus without complications: Secondary | ICD-10-CM

## 2023-07-14 LAB — POCT GLYCOSYLATED HEMOGLOBIN (HGB A1C): Hemoglobin A1C: 7.3 % — AB (ref 4.0–5.6)

## 2023-07-14 MED ORDER — LYUMJEV KWIKPEN 100 UNIT/ML ~~LOC~~ SOPN: PEN_INJECTOR | SUBCUTANEOUS | 3 refills | Status: DC

## 2023-07-14 NOTE — Patient Instructions (Addendum)
 Please continue checking sugars 2x a day: in the morning and approximately 2 hours after dinner  Please continue: - Metformin  1000 mg 2x a day with meals - Januvia  100 mg daily in am - Lantus  15 units in am   Change: - Lyumjev  2x a day - before b'fast and before dinner: 7 units for a small meal 8 units for a regular meal 9 units for a larger meal or if he has dessert after the meal  Call with sugars consistently <70 or >250.  Myrtie Atkinson can inject his own insulin .  Myrtie Atkinson needs to have sugar free (<1g sugars) condiments.  Please return in 4 months with your sugar log.

## 2023-07-14 NOTE — Progress Notes (Signed)
 Patient ID: Adam Fitzpatrick, adult   DOB: [birth year], 43 y.o.   MRN: [id number]   HPI: AGIM KINGCADE Myrtie Atkinson) is a 43 y.o. man, returning for f/u for DM1.5, dx as DM2 in 2011, insulin -dependent, uncontrolled, without long-term complications and low BMD for age.   Today, he comes with his aunt, who offers part of the history especially regarding blood sugars, medications, and pt's activity.  He last visit was 4 months ago.  Interim history: No No increased urination, blurry vision, nausea, chest pain. He presented to the emergency room recently with high blood sugars at 584 after dietary indiscretions (increased Ketchup intake after this has been approved to be placed on the table in the facility where he resides). He continues to do strength training exercises and riding his bike.  DM 1.5:  Reviewed HbA1c levels: Lab Results  Component Value Date   HGBA1C 6.6 (A) 03/11/2023   HGBA1C 7.9 (A) 10/15/2022   HGBA1C 8.3 (A) 06/17/2022   HGBA1C 7.3 (A) 02/15/2022   HGBA1C 8.6 (A) 10/12/2021   HGBA1C 7.4 (A) 06/15/2021   HGBA1C 8.1 (A) 03/13/2021   HGBA1C 8.4 (A) 11/29/2020   HGBA1C 9.2 (A) 10/03/2020   HGBA1C 6.1 (A) 11/02/2019  02/06/2022: HbA1c 7.6%  He is on: - Metformin  1000 mg 2x a day, with meals >> 2000 mg with dinner >> 1000 mg 2x a day with meals - Lyumjev  9-10 units before dinner unless eating out: 12-13 units before a larger meal or if you have dessert after the meal >> (prev. 3x a day 11/ and 06/2023, then qdaily)  >> 7 units for a small meal 9 units for a regular meal 11 units for a larger meal or if he has dessert after the meal - Lyumjev  Sliding scale: - 150-175: + 1 unit  - 176-200: + 2 units  - 201-225: + 3 units  - 226-250: + 4 units  >250: + 5 units - Onglyza 5 mg daily in am >>  Januvia  100 mg daily in a.m. - Lantus  9 >> 11 >> 15 units at bedtime >> in am He had chest discomfort with glipizide .  His CBGs are checked 2x a day at the facility: - am: 70, 103-198  >> 60, 69-139, 157, 213 >> 78-244 - 2h after b'fast: n/c >> 138, 145 >> n/c - before lunch: n/c >> 145 >> n/c - 2h after lunch: n/c >> 128 >> n/c - before dinner: n/c >> 138 >> n/c >> 118-172 >> 109-294, 390 - 2h after dinner: 66, 97-322 >> 51, 68, 84-168, 234 >> 65-309 - bedtime: n/c - nighttime: n/c Lowest sugar was 58 >> 58 (exercising) >> 51; it is unclear at which level he has hypoglycemia awareness. Highest sugar was 322 >> 281 >> 500s (see above).  Glucometer: AccuChek Aviva  Pt's meals are: - Breakfast:  cereal: cheerios + 2% milk >> bacon + eggs, 1-2 waffle; b'fast burritos - Lunch: 2 PB sandwiches on wheat >> now prepacked whole meals >45 g cabs >> soup, lunch bowl (up to 45g carbs) - Dinner: low carb >> now: same: 45-50g carbs >> home cooked: steak + mac and cheese or other starch, greens - Snacks: 2 snack a day: 10 am and  pm: nabs, sugar free jello cup, fruit He lost a significant amount of weight before starting insulin : 25-30 lbs.  -No CKD, last BUN/creatinine:  Lab Results  Component Value Date   BUN 10 03/11/2023   BUN 13 10/31/2022   CREATININE  0.73 03/11/2023   CREATININE 0.94 10/31/2022   Lab Results  Component Value Date   MICRALBCREAT 1.4 10/31/2022   MICRALBCREAT 0.7 10/23/2011  On losartan .  - + HL; last set of lipids: Lab Results  Component Value Date   CHOL 181 10/31/2022   HDL 67.10 10/31/2022   LDLCALC 94 10/31/2022   TRIG 101.0 10/31/2022   CHOLHDL 3 10/31/2022  He is not on a statin.  - last eye exam was in 03/2023: No DR reportedly.   - + numbness and tingling in his feet.  Last foot exam was by Dr. Zettie Hillock 03/11/2023.  B12 levels were normal: Lab Results  Component Value Date   VITAMINB12 458 03/26/2018   VITAMINB12 549 11/17/2015   Low BMD for age:  Reviewed history:  Reviewed the reports of available DXA scans: Z-score Lumbar spine (L1-L4) Femoral neck (FN) 33% distal radius UD radius  07/19/2022 [facility] [place]  [place]) -1.2 RFN: -2.6 LFN: n/a N/a N/a  11/02/2019 ([facility]) -1.8 RFN: -2.2 LFN: n/a -0.9 n/a  08/13/2016 ([facility]) -2.6 RFN: -2.4 LFN: n/a -0.1 n/a  05/04/2014 -2.1 RFN: -2.2 +0.6 +0.1   He had a left hip fracture in 2018 while rollerskating [place] [employer].  Vitamin D  level was normal at last check: Lab Results  Component Value Date   VD25OH 62.97 10/31/2022   VD25OH 52.0 11/02/2019   VD25OH 82.12 03/26/2018   VD25OH 72.84 11/17/2015  10/13/2020: Vitamin D  32.6  Calcitriol level was normal: Component     Latest Ref Rng & Units 10/08/2016  Vitamin D  1, 25 (OH) Total     18 - 72 pg/mL 31  Vitamin D3 1, 25 (OH)     pg/mL reviewed and addended history: 31  Vitamin D2 1, 25 (OH)     pg/mL <8    Medication history: We started Fosamax  11/2016.  No side effects.   In 10/2022, after DXA results returned, I suggested Reclast . First dose of Reclast  was 03/24/2023. He continues on calcium 600 mg twice a day and vitamin D  2000 units daily.  No weightbearing exercises.  She is not on vitamin A supplements  Pt does have a FH of osteoporosis: MGM.  No hypo or hypercalcemia or hyperparathyroidism.  No history of kidney stones. Lab Results  Component Value Date   PTH 21 10/08/2016   CALCIUM 10.4 03/11/2023   CALCIUM 10.5 10/31/2022   CALCIUM 9.7 08/26/2019   CALCIUM 10.5 07/15/2017   CALCIUM 9.8 10/08/2016   24-hour urine calcium was normal: Component     Latest Ref Rng & Units 10/14/2016  Creatinine, Urine     20 - 370 mg/dL 409  Creatinine, 81X Ur     0.63 - 2.50 g/24 h 1.57  Calcium, Ur     Not estab mg/dL 23  Calcium, 24 hour urine     55 - 300 mg/24 h 299   No thyrotoxicosis.  Reviewed TSH levels: 02/06/2022: TSH 1.380 Lab Results  Component Value Date   TSH 1.13 08/26/2019   TSH 1.35 07/15/2017   TSH 1.26 08/13/2016   Multiple myeloma and celiac disease work-up were negative: Component     Latest Ref Rng & Units 10/08/2016  Protein Urine Random      Not Estab. mg/dL 91.4  Albumin ELP, Urine     % 40.3  Alpha-1-Globulin, U     % 2.1  ALPHA-2-GLOBULIN, U     % 15.1  Beta Globulin, U     %  26.9  Gamma Globulin, U     % 15.7  M Component, Ur     Not Observed % Not Observed  Please Note:      Comment  Antigliadin Abs, IgA     0 - 19 units 2  Transglutaminase IgA     0 - 3 U/mL <2  IgA/Immunoglobulin A, Serum     90 - 386 mg/dL 161   ROS: + See HPI  I reviewed pt's medications, allergies, PMH, social hx, family hx, and changes were documented in the history of present illness. Otherwise, unchanged from my initial visit note.  Past Medical History:  Diagnosis Date   Abdominal pain 12/01/2015   Asthma    Blood in urine    Diabetes mellitus    GERD (gastroesophageal reflux disease)    Hyperlipidemia    notes only hx of hyperlipidemia   Loss of weight 12/06/2015   MR (mental retardation), moderate    MRSA (methicillin resistant staph aureus) culture positive    Osteoporosis 08/18/2016   Routine general medical examination at a health care facility 10/15/2012   Past Surgical History:  Procedure Laterality Date   HIP SURGERY     Left   INTRAMEDULLARY (IM) NAIL INTERTROCHANTERIC Left 04/10/2014   INTRAMEDULLARY (IM) NAIL INTERTROCHANTERIC Left 04/10/2014   Procedure: INTRAMEDULLARY (IM) NAIL INTERTROCHANTRIC LEFT HIP;  Surgeon: Saundra Curl, MD;  Location: [facility] OR;  Service: Orthopedics;  Laterality: Left;   Social History   Socioeconomic History   Marital status: Single    Spouse name: Not on file   Number of children: Not on file   Years of education: Not on file   Highest education level: Not on file  Occupational History   Not on file  Tobacco Use   Smoking status: Never   Smokeless tobacco: Never  Substance and Sexual Activity   Alcohol use: No   Drug use: No   Sexual activity: Never  Other Topics Concern   Not on file  Social History Narrative   Not on file   Social Drivers of Health   Financial  Resource Strain: Low Risk  (08/09/2022)   Received from [facility], [facility]   Overall Financial Resource Strain (CARDIA)    Difficulty of Paying Living Expenses: Not hard at all  Food Insecurity: No Food Insecurity (08/09/2022)   Received from [facility], [facility]   Hunger Vital Sign    Worried About Running Out of Food in the Last Year: Never true    Ran Out of Food in the Last Year: Never true  Transportation Needs: No Transportation Needs (08/09/2022)   Received from [facility], [facility]   PRAPARE - Transportation    Lack of Transportation (Medical): No    Lack of Transportation (Non-Medical): No  Physical Activity: Unknown (02/06/2022)   [facility]   Exercise Vital Sign    Days of Exercise per Week: 0 days    Minutes of Exercise per Session: Not on file  Stress: No Stress Concern Present (02/06/2022)   Received from [facility], [facility] - Occupational Stress Questionnaire    Feeling of Stress : Not at all  Social Connections: Unknown (02/12/2023)   Received from [facility]   Social Network    Social Network: Not on file  Intimate Partner Violence: Not At Risk (07/05/2023)   Received from [facility]   HITS    Over the last  12 months how often did your partner physically hurt you?: Never    Over the last 12 months how often did your partner insult you or talk down to you?: Never    Over the last 12 months how often did your partner threaten you with physical harm?: Never    Over the last 12 months how often did your partner scream or curse at you?: Never    Current Outpatient Medications on File Prior to Visit  Medication Sig Dispense Refill   ACCU-CHEK AVIVA PLUS test strip CHECK BLOOD SUGAR 3x DAILY 300 strip 3   Accu-Chek FastClix Lancets MISC CHECK BLOOD SUGARS 3x DAILY 300 each 3   acetaminophen  (TYLENOL ) 500 MG tablet Take 500 mg by mouth every 4 (four) hours as needed  (pain; fever >100).      albuterol  (VENTOLIN  HFA) 108 (90 Base) MCG/ACT inhaler Inhale 2 puffs into the lungs 4 (four) times daily as needed for wheezing or shortness of breath. Shortness of breath/wheezing 1 each 3   alendronate  (FOSAMAX ) 70 MG tablet TAKE 1 TABLET BY MOUTH EVERY 7 DAYS. TAKE WITH A FULL GLASS OF WATER ON AN EMPTY STOMACH. 12 tablet 3   Blood Glucose Monitoring Suppl (ACCU-CHEK GUIDE ME) w/Device KIT USE AS DIRECTED 1 kit PRN   Calcium Carbonate (CALCIUM 600 PO) Take by mouth 2 (two) times daily.     Cholecalciferol (VITAMIN D ) 2000 units CAPS Take by mouth.     diphenhydrAMINE  (BENADRYL ) 25 MG tablet Take 25-50 mg by mouth every 6 (six) hours as needed (allergic reaction). Take 2 tablets (50 mg) for ingestion of shellfish and seek emergency help.     docusate sodium  (COLACE) 100 MG capsule Take 1 capsule (100 mg total) by mouth 2 (two) times daily. Continue this while taking narcotics to help with bowel movements (Patient taking differently: Take 100 mg by mouth 2 (two) times daily. Continue this while taking narcotics to help with bowel movements  PRN) 30 capsule 1   famotidine  (PEPCID ) 40 MG tablet Take 1 tablet (40 mg total) by mouth at bedtime. 90 tablet 0   fluconazole  (DIFLUCAN ) 150 MG tablet 1 tab po daily x 3 days then repeat again in 1 week 6 tablet 1   fluticasone  (FLONASE ) 50 MCG/ACT nasal spray Place 1 spray into both nostrils daily as needed for allergies or rhinitis (PRN). 48 mL 0   gabapentin  (NEURONTIN ) 100 MG capsule Take 1 capsule (100 mg total) by mouth at bedtime. 90 capsule 0   glucose blood test strip Use as instructed 100 each 12   guaiFENesin-dextromethorphan (ROBITUSSIN DM) 100-10 MG/5ML syrup Take 15 mLs by mouth every 4 (four) hours as needed for cough (cold symptoms). Reported on 08/17/2015     ibuprofen (ADVIL,MOTRIN) 200 MG tablet Take 200 mg by mouth every 4 (four) hours as needed.     insulin  glargine (LANTUS  SOLOSTAR) 100 UNIT/ML Solostar Pen INJECT  15 UNITS UNDER THE SKIN DAILY IN AM 30 mL 3   Insulin  Lispro-aabc (LYUMJEV  KWIKPEN) 100 UNIT/ML KwikPen Inject under skin 7-11 units before dinner 30 mL 3   Insulin  Pen Needle (CAREFINE PEN NEEDLES) 32G X 4 MM MISC Use 2x a day 200 each 3   ketoconazole  (NIZORAL ) 2 % cream Apply 1 application topically daily. 15 g 1   loperamide (IMODIUM A-D) 2 MG tablet Take 2 mg by mouth as needed for diarrhea or loose stools. Take 2 caplets  or 4 tsp (20 mls) liquid by mouth after first  loose stool and 1 caplet or 1 tsp (5 mls) liquid by mouth for each subsequent stool as needed; not to exceed 8 caplets or 50 mls in 24 hours     loratadine  (CLARITIN ) 10 MG tablet Take 1 tablet (10 mg total) by mouth daily. 90 tablet 0   losartan  (COZAAR ) 25 MG tablet Take 1 tablet (25 mg total) by mouth daily. 90 tablet 3   metFORMIN  (GLUCOPHAGE ) 1000 MG tablet TAKE 1 TABLET BY MOUTH TWICE DAILY WITH A MEAL. 180 tablet 1   montelukast  (SINGULAIR ) 10 MG tablet TAKE 1 TABLET BY MOUTH ONCE DAILY. 31 tablet PRN   sitaGLIPtin  (JANUVIA ) 100 MG tablet TAKE 1 TABLET (100MG ) BY MOUTH ONCE DAILY BEFORE BREAKFAST 90 tablet 3   No current facility-administered medications on file prior to visit.    Allergies  Allergen Reactions   Shellfish Allergy Anaphylaxis   Iohexol     IVP Dye    Family History  Problem Relation Age of Onset   Alcohol abuse Father    Cancer Maternal Grandmother    Hyperlipidemia Maternal Grandmother    Heart disease Maternal Grandmother    Hypertension Maternal Grandmother    Heart disease Maternal Grandfather    Hypertension Maternal Grandfather    Hyperlipidemia Maternal Grandfather    Hyperlipidemia Paternal Grandmother    Heart disease Paternal Grandmother    Hypertension Paternal Grandmother    Kidney disease Paternal Grandmother    Diabetes Paternal Grandmother    Heart disease Paternal Grandfather    Hypertension Paternal Grandfather    Hyperlipidemia Paternal Grandfather    Alcohol abuse  Mother    Pt has FH of DM in father and Paternal GPs.  PE: There were no vitals taken for this visit. Wt Readings from Last 3 Encounters:  03/24/23 141 lb 3.2 oz (64 kg)  03/11/23 144 lb 9.6 oz (65.6 kg)  10/15/22 147 lb 9.6 oz (67 kg)   Constitutional: thin, in NAD, thoracic kyphosis with poor posture Eyes:EOMI, no exophthalmos ENT: no cervical lymphadenopathy Cardiovascular: RRR, No MRG Respiratory: CTA B Musculoskeletal: no deformities Skin: no rashes Neurological: no tremor with outstretched hands  ASSESSMENT: 1. 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 Low BMD for age  PLAN:  1. Patient with history of uncontrolled type 1.5 diabetes (LADA), with improved control during the coronavirus pandemic as he was not going out for meals, but worsening control after switching facilities.  However, before last visit, sugars started to improve to the point of lows.  They were usually after dinner but occasionally in the morning.  Sugars were mostly at goal otherwise except for hyperglycemic spikes usually on Saturday nights when he was going home.  We reduced his Lyumjev  insulin  before dinner and continued the rest of the regimen. -Patient's aunt contacted me recently as Myrtie Atkinson was in the emergency room with a high blood sugar of 584 after dietary indiscretions.  No ketosis.  She was discharged when blood sugars dropped to the 200s.   -At  today's visit, reviewing the blood sugars at home, they are quite high, and alternating between 60s and upper 300s.  He is on stillstands this is related to the fact that the facility  added regular Ketch-up and [place] sauce on the table and elevated was using these excessively.  She discussed with him and he started to stay away from them.  She would want me to let the facility know to only give him sugar-free condiments.  Also, she mentions that Myrtie Atkinson was given Lyumjev  insulin  3 times a day for 2 months during the holidays and this was switched to only once a day after the beginning of the year.  As of now, however, we discussed that sugars are quite high and I would recommend to take the Lyumjev  more frequently.  Since he is not home for lunch, we decided to start with taking Lyumjev  before breakfast and before dinner hopefully avoiding the need to take it with lunch.  I will decrease his doses of Lyumjev  since the sugars could be quite low after he takes 11 units for a meal. - I suggested to:  Continu Patient Instructions  Please continue checking sugars 2x a day: in the morning and approximately 2 hours after dinner  Please continue: - Metformin  1000 mg 2x a day with meals - Januvia  100 mg daily in am - Lantus  15 units in am   Change: - Lyumjev  2x a day - before b'fast and before dinner: 7 units for a small meal 8 units for a regular meal 9 units for a larger meal or if he has dessert after the meal  Call with sugars consistently <70 or >250.  Myrtie Atkinson can inject his own insulin .  Myrtie Atkinson needs to have sugar free (<1g sugars) condiments.  Please return in 4 months with your sugar log.   - we checked his HbA1c: 7.3% (higher) - advised to check sugars at different times of the day - 2x a day, rotating check times - advised for yearly eye exams >> he is UTD - return to clinic in 4 months  2. Low BMD for age -No falls or fractures since last visit -he had a hip fracture while rollerskating in 2015.  At that time, a bone density scan showed mildly low BMD for age.  We started calcium and vitamin D  2000 units daily.  He continues on this now.  His vitamin D  level  was normal in 10/2022. -We also started Fosamax  in 2019.  He tolerates it well.  At previous visits we discussed about continuing this for 5 years, after which I suggested a drug holiday after the new bone density results returned.  He finally had a bone density scan in 07/2022 at [facility] which is on a different machine.  I did discuss with him sounds that going forward, we need to have the bone density scans performed on the same DXA machine.  His disease course were improved at the level of the spine, however, they were worse at the level of the right femoral neck.  Therefore, I suggested to start Reclast  and stop Fosamax  when starting Reclast .  He had the first infusion 03/24/2023.  He tolerated it well, without jaw/hip/thigh pain.  Plan to continue this for another 1-2 years. -Latest kidney function and calcium levels were normal 03/2023 -Continue the strength training exercises -Will plan to get another bone density scan 2 years from the previous  Aunt: Andrez Banker [address] [place], [place], [address]  Emilie Harden, MD PhD [facility]

## 2023-07-22 ENCOUNTER — Encounter: Payer: Self-pay | Admitting: Internal Medicine

## 2023-07-22 DIAGNOSIS — K625 Hemorrhage of anus and rectum: Secondary | ICD-10-CM | POA: Diagnosis not present

## 2023-07-31 ENCOUNTER — Encounter: Payer: Self-pay | Admitting: Internal Medicine

## 2023-08-04 ENCOUNTER — Other Ambulatory Visit: Payer: Self-pay

## 2023-08-04 DIAGNOSIS — K921 Melena: Secondary | ICD-10-CM | POA: Diagnosis not present

## 2023-08-04 DIAGNOSIS — E139 Other specified diabetes mellitus without complications: Secondary | ICD-10-CM

## 2023-08-04 DIAGNOSIS — K648 Other hemorrhoids: Secondary | ICD-10-CM | POA: Diagnosis not present

## 2023-08-04 MED ORDER — METFORMIN HCL 1000 MG PO TABS: ORAL_TABLET | ORAL | 1 refills | Status: DC

## 2023-09-11 ENCOUNTER — Other Ambulatory Visit: Payer: Self-pay

## 2023-09-11 MED ORDER — LANTUS SOLOSTAR 100 UNIT/ML ~~LOC~~ SOPN: PEN_INJECTOR | SUBCUTANEOUS | 3 refills | Status: DC

## 2023-09-11 NOTE — Telephone Encounter (Signed)
 Requested Prescriptions   Signed Prescriptions Disp Refills   insulin glargine (LANTUS SOLOSTAR) 100 UNIT/ML Solostar Pen 30 mL 3    Sig: INJECT 15 UNITS UNDER THE SKIN DAILY IN AM    Authorizing Provider: Carlus Pavlov    Ordering User: Pollie Meyer

## 2023-09-18 ENCOUNTER — Other Ambulatory Visit: Payer: Self-pay | Admitting: Internal Medicine

## 2023-09-18 DIAGNOSIS — E139 Other specified diabetes mellitus without complications: Secondary | ICD-10-CM

## 2023-10-13 ENCOUNTER — Encounter: Payer: Self-pay | Admitting: Internal Medicine

## 2023-10-13 ENCOUNTER — Ambulatory Visit: Payer: 59 | Admitting: Internal Medicine

## 2023-10-13 DIAGNOSIS — E139 Other specified diabetes mellitus without complications: Secondary | ICD-10-CM

## 2023-10-13 MED ORDER — ACCU-CHEK AVIVA PLUS VI STRP: ORAL_STRIP | 3 refills | Status: AC

## 2023-10-13 MED ORDER — METFORMIN HCL 1000 MG PO TABS: ORAL_TABLET | ORAL | 3 refills | Status: AC

## 2023-10-13 MED ORDER — ACCU-CHEK FASTCLIX LANCETS MISC
3 refills | Status: AC
Start: 2023-10-13 — End: ?

## 2023-10-28 ENCOUNTER — Encounter: Payer: Self-pay | Admitting: Internal Medicine

## 2023-10-29 ENCOUNTER — Other Ambulatory Visit: Payer: Self-pay

## 2023-10-29 MED ORDER — LYUMJEV KWIKPEN 100 UNIT/ML ~~LOC~~ SOPN: PEN_INJECTOR | SUBCUTANEOUS | 3 refills | Status: DC

## 2023-10-29 NOTE — Telephone Encounter (Signed)
 Prescription resent as pharmacy not showing any refill on file

## 2023-11-03 DIAGNOSIS — M222X1 Patellofemoral disorders, right knee: Secondary | ICD-10-CM | POA: Diagnosis not present

## 2023-11-03 DIAGNOSIS — M170 Bilateral primary osteoarthritis of knee: Secondary | ICD-10-CM | POA: Diagnosis not present

## 2023-11-03 DIAGNOSIS — M25561 Pain in right knee: Secondary | ICD-10-CM | POA: Diagnosis not present

## 2023-11-03 DIAGNOSIS — E139 Other specified diabetes mellitus without complications: Secondary | ICD-10-CM | POA: Diagnosis not present

## 2023-11-03 DIAGNOSIS — M859 Disorder of bone density and structure, unspecified: Secondary | ICD-10-CM | POA: Diagnosis not present

## 2023-11-03 DIAGNOSIS — M25562 Pain in left knee: Secondary | ICD-10-CM | POA: Diagnosis not present

## 2023-11-03 DIAGNOSIS — M222X2 Patellofemoral disorders, left knee: Secondary | ICD-10-CM | POA: Diagnosis not present

## 2023-11-11 DIAGNOSIS — M25361 Other instability, right knee: Secondary | ICD-10-CM | POA: Diagnosis not present

## 2023-11-11 DIAGNOSIS — M25562 Pain in left knee: Secondary | ICD-10-CM | POA: Diagnosis not present

## 2023-11-11 DIAGNOSIS — M25362 Other instability, left knee: Secondary | ICD-10-CM | POA: Diagnosis not present

## 2023-11-11 DIAGNOSIS — M25561 Pain in right knee: Secondary | ICD-10-CM | POA: Diagnosis not present

## 2023-11-18 ENCOUNTER — Encounter: Payer: Self-pay | Admitting: Internal Medicine

## 2023-11-18 ENCOUNTER — Telehealth: Payer: Self-pay | Admitting: Dietician

## 2023-11-18 DIAGNOSIS — E139 Other specified diabetes mellitus without complications: Secondary | ICD-10-CM

## 2023-11-18 NOTE — Telephone Encounter (Signed)
 Returned patient's aunts call.  She states that patient would like to start the Dexcom G7 and will need education on this.  Referral requested.  Appointment made that fits patient and aunt's schedule.  Cydne Doyne, RD, LDN, CDCES, DipACLM

## 2023-11-19 ENCOUNTER — Other Ambulatory Visit: Payer: Self-pay | Admitting: Internal Medicine

## 2023-11-19 DIAGNOSIS — E1165 Type 2 diabetes mellitus with hyperglycemia: Secondary | ICD-10-CM

## 2023-11-19 MED ORDER — DEXCOM G7 SENSOR MISC: 3 refills | Status: AC

## 2023-11-19 MED ORDER — DEXCOM G7 RECEIVER DEVI: 0 refills | Status: AC

## 2023-11-19 NOTE — Telephone Encounter (Signed)
 Referral entered. C

## 2023-11-27 DIAGNOSIS — M25362 Other instability, left knee: Secondary | ICD-10-CM | POA: Diagnosis not present

## 2023-11-27 DIAGNOSIS — M25361 Other instability, right knee: Secondary | ICD-10-CM | POA: Diagnosis not present

## 2023-11-27 DIAGNOSIS — M25561 Pain in right knee: Secondary | ICD-10-CM | POA: Diagnosis not present

## 2023-11-27 DIAGNOSIS — M25562 Pain in left knee: Secondary | ICD-10-CM | POA: Diagnosis not present

## 2023-12-04 DIAGNOSIS — M25561 Pain in right knee: Secondary | ICD-10-CM | POA: Diagnosis not present

## 2023-12-04 DIAGNOSIS — M25361 Other instability, right knee: Secondary | ICD-10-CM | POA: Diagnosis not present

## 2023-12-04 DIAGNOSIS — M25362 Other instability, left knee: Secondary | ICD-10-CM | POA: Diagnosis not present

## 2023-12-04 DIAGNOSIS — M25562 Pain in left knee: Secondary | ICD-10-CM | POA: Diagnosis not present

## 2023-12-11 DIAGNOSIS — M25361 Other instability, right knee: Secondary | ICD-10-CM | POA: Diagnosis not present

## 2023-12-11 DIAGNOSIS — M25561 Pain in right knee: Secondary | ICD-10-CM | POA: Diagnosis not present

## 2023-12-11 DIAGNOSIS — M25362 Other instability, left knee: Secondary | ICD-10-CM | POA: Diagnosis not present

## 2023-12-11 DIAGNOSIS — M25562 Pain in left knee: Secondary | ICD-10-CM | POA: Diagnosis not present

## 2023-12-18 DIAGNOSIS — M25362 Other instability, left knee: Secondary | ICD-10-CM | POA: Diagnosis not present

## 2023-12-18 DIAGNOSIS — M25361 Other instability, right knee: Secondary | ICD-10-CM | POA: Diagnosis not present

## 2023-12-18 DIAGNOSIS — M25562 Pain in left knee: Secondary | ICD-10-CM | POA: Diagnosis not present

## 2023-12-18 DIAGNOSIS — M25561 Pain in right knee: Secondary | ICD-10-CM | POA: Diagnosis not present

## 2023-12-25 DIAGNOSIS — M25561 Pain in right knee: Secondary | ICD-10-CM | POA: Diagnosis not present

## 2023-12-25 DIAGNOSIS — M25361 Other instability, right knee: Secondary | ICD-10-CM | POA: Diagnosis not present

## 2023-12-25 DIAGNOSIS — M25562 Pain in left knee: Secondary | ICD-10-CM | POA: Diagnosis not present

## 2023-12-25 DIAGNOSIS — M25362 Other instability, left knee: Secondary | ICD-10-CM | POA: Diagnosis not present

## 2024-01-05 ENCOUNTER — Ambulatory Visit: Admitting: Dietician

## 2024-01-07 ENCOUNTER — Ambulatory Visit (INDEPENDENT_AMBULATORY_CARE_PROVIDER_SITE_OTHER): Admitting: Internal Medicine

## 2024-01-07 ENCOUNTER — Encounter: Attending: Internal Medicine | Admitting: Nutrition

## 2024-01-07 ENCOUNTER — Encounter: Payer: Self-pay | Admitting: Internal Medicine

## 2024-01-07 VITALS — BP 120/60 | HR 90 | Ht 71.0 in | Wt 140.8 lb

## 2024-01-07 DIAGNOSIS — M859 Disorder of bone density and structure, unspecified: Secondary | ICD-10-CM | POA: Diagnosis not present

## 2024-01-07 DIAGNOSIS — L309 Dermatitis, unspecified: Secondary | ICD-10-CM | POA: Diagnosis not present

## 2024-01-07 DIAGNOSIS — E559 Vitamin D deficiency, unspecified: Secondary | ICD-10-CM | POA: Diagnosis not present

## 2024-01-07 DIAGNOSIS — E139 Other specified diabetes mellitus without complications: Secondary | ICD-10-CM

## 2024-01-07 DIAGNOSIS — B86 Scabies: Secondary | ICD-10-CM | POA: Diagnosis not present

## 2024-01-07 DIAGNOSIS — Z794 Long term (current) use of insulin: Secondary | ICD-10-CM | POA: Insufficient documentation

## 2024-01-07 DIAGNOSIS — R21 Rash and other nonspecific skin eruption: Secondary | ICD-10-CM | POA: Diagnosis not present

## 2024-01-07 DIAGNOSIS — E1165 Type 2 diabetes mellitus with hyperglycemia: Secondary | ICD-10-CM | POA: Insufficient documentation

## 2024-01-07 LAB — POCT GLYCOSYLATED HEMOGLOBIN (HGB A1C): Hemoglobin A1C: 7.7 % — AB (ref 4.0–5.6)

## 2024-01-07 MED ORDER — LANTUS SOLOSTAR 100 UNIT/ML ~~LOC~~ SOPN: PEN_INJECTOR | SUBCUTANEOUS | 3 refills | Status: AC

## 2024-01-07 NOTE — Progress Notes (Signed)
 Patient ID: Adam Fitzpatrick, adult   DOB: 11-23-1980, 43 y.o.   MRN: [id number]   HPI: Adam SHADDOCK Charolotte) is a 43 y.o. man, returning for f/u for DM1.5, dx as DM2 in 2011, insulin -dependent, uncontrolled, without long-term complications and low BMD for age.   He is here with his aunt, who offers part of the history especially regarding blood sugars, medications, and pt's activity.  Last visit was 4 months ago.  Interim history: No increased urination, blurry vision, nausea, chest pain. He continues to do strength training exercises but not riding his bike right now b/c it is broken.  They are planning to repair it.  He is currently in PT for patellar pain sd. His aunt is preparing for knee replacement surgery in 02/2024.  DM 1.5:  Reviewed HbA1c levels: Lab Results  Component Value Date   HGBA1C 7.3 (A) 07/14/2023   HGBA1C 6.6 (A) 03/11/2023   HGBA1C 7.9 (A) 10/15/2022   HGBA1C 8.3 (A) 06/17/2022   HGBA1C 7.3 (A) 02/15/2022   HGBA1C 8.6 (A) 10/12/2021   HGBA1C 7.4 (A) 06/15/2021   HGBA1C 8.1 (A) 03/13/2021   HGBA1C 8.4 (A) 11/29/2020   HGBA1C 9.2 (A) 10/03/2020  02/06/2022: HbA1c 7.6%  He is on: - Metformin  1000 mg 2x a day, with meals >> 2000 mg with dinner >> 1000 mg 2x a day with meals - Lyumjev   7 units for a small meal 8 units for a regular meal 9 units for a larger meal or if he has dessert after the meal - Onglyza 5 mg daily in am >>  Januvia  100 mg daily in a.m. - Lantus  9 >> 11 >> 15 units at bedtime >> in am He had chest discomfort with glipizide .  His CBGs are checked 2x a day at the facility: - am: 70, 103-198 >> 60, 69-139, 157, 213 >> 78-244 >> 89-237 - 2h after b'fast: n/c >> 138, 145 >> n/c - before lunch: n/c >> 145 >> n/c - 2h after lunch: n/c >> 128 >> n/c - before dinner:  118-172 >> 109-294, 390 >> 109-211, 340 - 2h after dinner: 51, 68, 84-168, 234 >> 65-309 >> 67, 198, 346 - bedtime: n/c - nighttime: n/c Lowest sugar was 58 >> 58 (exercising) >>  51 >> 67; it is unclear at which level he has hypoglycemia awareness. Highest sugar was 322 >> 281 >> 500s (see above) >> 346.  Glucometer: AccuChek Aviva  Pt's meals are: - Breakfast:  cereal: cheerios + 2% milk >> bacon + eggs, 1-2 waffle; b'fast burritos - Lunch: 2 PB sandwiches on wheat >> now prepacked whole meals >45 g cabs >> soup, lunch bowl (up to 45g carbs) - Dinner: low carb >> now: same: 45-50g carbs >> home cooked: steak + mac and cheese or other starch, greens - Snacks: 2 snack a day: 10 am and  pm: nabs, sugar free jello cup, fruit He lost a significant amount of weight before starting insulin : 25-30 lbs.  -No CKD, last BUN/creatinine:  Lab Results  Component Value Date   BUN 10 03/11/2023   BUN 13 10/31/2022   CREATININE 0.73 03/11/2023   CREATININE 0.94 10/31/2022   No results found for: MICRALBCREAT On losartan .  - + HL; last set of lipids: Lab Results  Component Value Date   CHOL 181 10/31/2022   HDL 67.10 10/31/2022   LDLCALC 94 10/31/2022   TRIG 101.0 10/31/2022   CHOLHDL 3 10/31/2022  He is not on a statin.  -  last eye exam was in 03/2023: No DR reportedly.   - + numbness and tingling in his feet.  Last foot exam was by Dr. Loreda 03/11/2023.  B12 levels were normal: Lab Results  Component Value Date   VITAMINB12 458 03/26/2018   VITAMINB12 549 11/17/2015   Low BMD for age:  Reviewed history:  Reviewed the reports of available DXA scans: Z-score Lumbar spine (L1-L4) Femoral neck (FN) 33% distal radius UD radius  07/19/2022 [facility] [place] [place]) -1.2 RFN: -2.6 LFN: n/a N/a N/a  11/02/2019 ([facility]) -1.8 RFN: -2.2 LFN: n/a -0.9 n/a  08/13/2016 ([facility]) -2.6 RFN: -2.4 LFN: n/a -0.1 n/a  05/04/2014 -2.1 RFN: -2.2 +0.6 +0.1   He had a left hip fracture in 2018 while rollerskating [place] [employer].  Vitamin D  level was normal at last check: Lab Results  Component Value Date   VD25OH 62.97 10/31/2022   VD25OH 52.0  11/02/2019   VD25OH 82.12 03/26/2018   VD25OH 72.84 11/17/2015  10/13/2020: Vitamin D  32.6  Calcitriol level was normal: Component     Latest Ref Rng & Units 10/08/2016  Vitamin D  1, 25 (OH) Total     18 - 72 pg/mL 31  Vitamin D3 1, 25 (OH)     pg/mL reviewed and addended history: 31  Vitamin D2 1, 25 (OH)     pg/mL <8    Medication history: We started Fosamax  11/2016.  No side effects.   In 10/2022, after DXA results returned, I suggested Reclast . First dose of Reclast  was 03/24/2023. He continues on calcium 600 mg twice a day and vitamin D  2000 units daily.  No weightbearing exercises.  She is not on vitamin A supplements  Pt does have a FH of osteoporosis: MGM.  No hypo or hypercalcemia or hyperparathyroidism.  No history of kidney stones. Lab Results  Component Value Date   PTH 21 10/08/2016   CALCIUM 10.4 03/11/2023   CALCIUM 10.5 10/31/2022   CALCIUM 9.7 08/26/2019   CALCIUM 10.5 07/15/2017   CALCIUM 9.8 10/08/2016   24-hour urine calcium was normal: Component     Latest Ref Rng & Units 10/14/2016  Creatinine, Urine     20 - 370 mg/dL 878  Creatinine, 75Y Ur     0.63 - 2.50 g/24 h 1.57  Calcium, Ur     Not estab mg/dL 23  Calcium, 24 hour urine     55 - 300 mg/24 h 299   No thyrotoxicosis.  Reviewed TSH levels: 02/06/2022: TSH 1.380 Lab Results  Component Value Date   TSH 1.13 08/26/2019   TSH 1.35 07/15/2017   TSH 1.26 08/13/2016   Multiple myeloma and celiac disease work-up were negative: Component     Latest Ref Rng & Units 10/08/2016  Protein Urine Random     Not Estab. mg/dL 80.1  Albumin ELP, Urine     % 40.3  Alpha-1-Globulin, U     % 2.1  ALPHA-2-GLOBULIN, U     % 15.1  Beta Globulin, U     % 26.9  Gamma Globulin, U     % 15.7  M Component, Ur     Not Observed % Not Observed  Please Note:      Comment  Antigliadin Abs, IgA     0 - 19 units 2  Transglutaminase IgA     0 - 3 U/mL <2  IgA/Immunoglobulin A, Serum     90 - 386 mg/dL  859   ROS: + See  HPI  I reviewed pt's medications, allergies, PMH, social hx, family hx, and changes were documented in the history of present illness. Otherwise, unchanged from my initial visit note.  Past Medical History:  Diagnosis Date   Abdominal pain 12/01/2015   Asthma    Blood in urine    Diabetes mellitus    GERD (gastroesophageal reflux disease)    Hyperlipidemia    notes only hx of hyperlipidemia   Loss of weight 12/06/2015   MR (mental retardation), moderate    MRSA (methicillin resistant staph aureus) culture positive    Osteoporosis 08/18/2016   Routine general medical examination at a health care facility 10/15/2012   Past Surgical History:  Procedure Laterality Date   HIP SURGERY     Left   INTRAMEDULLARY (IM) NAIL INTERTROCHANTERIC Left 04/10/2014   INTRAMEDULLARY (IM) NAIL INTERTROCHANTERIC Left 04/10/2014   Procedure: INTRAMEDULLARY (IM) NAIL INTERTROCHANTRIC LEFT HIP;  Surgeon: Evalene JONETTA Chancy, MD;  Location: [facility] OR;  Service: Orthopedics;  Laterality: Left;   Social History   Socioeconomic History   Marital status: Single    Spouse name: Not on file   Number of children: Not on file   Years of education: Not on file   Highest education level: Not on file  Occupational History   Not on file  Tobacco Use   Smoking status: Never   Smokeless tobacco: Never  Substance and Sexual Activity   Alcohol use: No   Drug use: No   Sexual activity: Never  Other Topics Concern   Not on file  Social History Narrative   Not on file   Social Drivers of Health   Financial Resource Strain: Low Risk  (11/01/2023)   Received from [facility]   Overall Financial Resource Strain (CARDIA)    Difficulty of Paying Living Expenses: Not very hard  Food Insecurity: No Food Insecurity (11/01/2023)   Received from [facility]   Hunger Vital Sign    Within the past 12 months, you worried that your food would run out before you got the money to buy more.: Never true     Within the past 12 months, the food you bought just didn't last and you didn't have money to get more.: Never true  Transportation Needs: No Transportation Needs (11/01/2023)   Received from [facility] - Transportation    Lack of Transportation (Medical): No    Lack of Transportation (Non-Medical): No  Physical Activity: Insufficiently Active (11/01/2023)   Received from [facility]   Exercise Vital Sign    On average, how many days per week do you engage in moderate to strenuous exercise (like a brisk walk)?: 2 days    On average, how many minutes do you engage in exercise at this level?: 20 min  Stress: No Stress Concern Present (11/01/2023)   Received from [facility] - Occupational Stress Questionnaire    Feeling of Stress : Only a little  Social Connections: Moderately Integrated (11/01/2023)   Received from [facility]   Social Network    How would you rate your social network (family, work, friends)?: Adequate participation with social networks  Intimate Partner Violence: Not At Risk (11/01/2023)   Received from [facility]   HITS    Over the last 12 months how often did your partner physically hurt you?: Never    Over the last 12 months how often did your partner insult you or talk down to you?:  Never    Over the last 12 months how often did your partner threaten you with physical harm?: Never    Over the last 12 months how often did your partner scream or curse at you?: Never    Current Outpatient Medications on File Prior to Visit  Medication Sig Dispense Refill   ACCU-CHEK AVIVA PLUS test strip CHECK BLOOD SUGAR 3x DAILY 300 strip 3   Accu-Chek FastClix Lancets MISC CHECK BLOOD SUGARS 3x DAILY 300 each 3   acetaminophen  (TYLENOL ) 500 MG tablet Take 500 mg by mouth every 4 (four) hours as needed (pain; fever >100).      albuterol  (VENTOLIN  HFA) 108 (90 Base) MCG/ACT inhaler Inhale 2 puffs into the lungs 4 (four)  times daily as needed for wheezing or shortness of breath. Shortness of breath/wheezing 1 each 3   alendronate  (FOSAMAX ) 70 MG tablet TAKE 1 TABLET BY MOUTH EVERY 7 DAYS. TAKE WITH A FULL GLASS OF WATER ON AN EMPTY STOMACH. 12 tablet 3   BD PEN NEEDLE NANO 2ND GEN 32G X 4 MM MISC USE TWICE DAILY **REORDER WHEN NEEDED-NOT A CYCLE FILL MED** 100 each 12   Blood Glucose Monitoring Suppl (ACCU-CHEK GUIDE ME) w/Device KIT USE AS DIRECTED 1 kit PRN   Calcium Carbonate (CALCIUM 600 PO) Take by mouth 2 (two) times daily.     Cholecalciferol (VITAMIN D ) 2000 units CAPS Take by mouth.     Continuous Glucose Receiver (DEXCOM G7 RECEIVER) DEVI Use to monitor glucose continuously 1 each 0   Continuous Glucose Sensor (DEXCOM G7 SENSOR) MISC Use to check glucose continuously, change sensor every 10 days 9 each 3   diphenhydrAMINE  (BENADRYL ) 25 MG tablet Take 25-50 mg by mouth every 6 (six) hours as needed (allergic reaction). Take 2 tablets (50 mg) for ingestion of shellfish and seek emergency help.     docusate sodium  (COLACE) 100 MG capsule Take 1 capsule (100 mg total) by mouth 2 (two) times daily. Continue this while taking narcotics to help with bowel movements (Patient taking differently: Take 100 mg by mouth 2 (two) times daily. Continue this while taking narcotics to help with bowel movements  PRN) 30 capsule 1   famotidine  (PEPCID ) 40 MG tablet Take 1 tablet (40 mg total) by mouth at bedtime. 90 tablet 0   fluconazole  (DIFLUCAN ) 150 MG tablet 1 tab po daily x 3 days then repeat again in 1 week 6 tablet 1   fluticasone  (FLONASE ) 50 MCG/ACT nasal spray Place 1 spray into both nostrils daily as needed for allergies or rhinitis (PRN). 48 mL 0   gabapentin  (NEURONTIN ) 100 MG capsule Take 1 capsule (100 mg total) by mouth at bedtime. 90 capsule 0   glucose blood test strip Use as instructed 100 each 12   guaiFENesin-dextromethorphan (ROBITUSSIN DM) 100-10 MG/5ML syrup Take 15 mLs by mouth every 4 (four) hours as  needed for cough (cold symptoms). Reported on 08/17/2015     ibuprofen (ADVIL,MOTRIN) 200 MG tablet Take 200 mg by mouth every 4 (four) hours as needed.     insulin  glargine (LANTUS  SOLOSTAR) 100 UNIT/ML Solostar Pen INJECT 15 UNITS UNDER THE SKIN DAILY IN AM 30 mL 3   Insulin  Lispro-aabc (LYUMJEV  KWIKPEN) 100 UNIT/ML KwikPen Inject under skin 7-9 units before b'fast and dinner 30 mL 3   ketoconazole  (NIZORAL ) 2 % cream Apply 1 application topically daily. 15 g 1   loperamide (IMODIUM A-D) 2 MG tablet Take 2 mg by mouth as needed for diarrhea or  loose stools. Take 2 caplets  or 4 tsp (20 mls) liquid by mouth after first loose stool and 1 caplet or 1 tsp (5 mls) liquid by mouth for each subsequent stool as needed; not to exceed 8 caplets or 50 mls in 24 hours     loratadine  (CLARITIN ) 10 MG tablet Take 1 tablet (10 mg total) by mouth daily. 90 tablet 0   losartan  (COZAAR ) 25 MG tablet Take 1 tablet (25 mg total) by mouth daily. 90 tablet 3   metFORMIN  (GLUCOPHAGE ) 1000 MG tablet TAKE 1 TABLET BY MOUTH TWICE DAILY WITH A MEAL. 180 tablet 3   montelukast  (SINGULAIR ) 10 MG tablet TAKE 1 TABLET BY MOUTH ONCE DAILY. 31 tablet PRN   polyethylene glycol (MIRALAX / GLYCOLAX) 17 g packet Take 17 g by mouth daily. As needed     sitaGLIPtin  (JANUVIA ) 100 MG tablet TAKE 1 TABLET (100MG ) BY MOUTH ONCE DAILY BEFORE BREAKFAST 90 tablet 3   No current facility-administered medications on file prior to visit.    Allergies  Allergen Reactions   Shellfish Allergy Anaphylaxis   Iohexol     IVP Dye    Family History  Problem Relation Age of Onset   Alcohol abuse Father    Cancer Maternal Grandmother    Hyperlipidemia Maternal Grandmother    Heart disease Maternal Grandmother    Hypertension Maternal Grandmother    Heart disease Maternal Grandfather    Hypertension Maternal Grandfather    Hyperlipidemia Maternal Grandfather    Hyperlipidemia Paternal Grandmother    Heart disease Paternal Grandmother     Hypertension Paternal Grandmother    Kidney disease Paternal Grandmother    Diabetes Paternal Grandmother    Heart disease Paternal Grandfather    Hypertension Paternal Grandfather    Hyperlipidemia Paternal Grandfather    Alcohol abuse Mother    Pt has FH of DM in father and Paternal GPs.  PE: BP 120/60   Pulse 90   Ht 5' 11 (1.803 m)   Wt 140 lb 12.8 oz (63.9 kg)   SpO2 98%   BMI 19.64 kg/m  Wt Readings from Last 3 Encounters:  01/07/24 140 lb 12.8 oz (63.9 kg)  07/14/23 142 lb 12.8 oz (64.8 kg)  03/24/23 141 lb 3.2 oz (64 kg)   Constitutional: thin, in NAD, thoracic kyphosis with poor posture Eyes:EOMI, no exophthalmos ENT: no cervical lymphadenopathy Cardiovascular: RRR, No MRG Respiratory: CTA B Musculoskeletal: no deformities Skin: no rashes Neurological: no tremor with outstretched hands Diabetic Foot Exam - Simple   Simple Foot Form Diabetic Foot exam was performed with the following findings: Yes 01/07/2024 10:47 AM  Visual Inspection No deformities, no ulcerations, no other skin breakdown bilaterally: Yes Sensation Testing See comments: Yes Pulse Check Posterior Tibialis and Dorsalis pulse intact bilaterally: Yes Comments + desquamative rash B + no sensation to monofilment B + Onychodystrophy in all toenails     ASSESSMENT: 1. 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 Low BMD for age  Aunt: Adam Fitzpatrick, Adam Fitzpatrick, Adam Fitzpatrick  PLAN:  1. Patient with history of type 1.5 diabetes (LADA), with improved control during the coronavirus pandemic as he was  not going out for meals, but worsening control after switching facilities.  Before last visit he had a high blood sugar of 584 after dietary indiscretions.  No ketosis.  He was in the emergency room but discharged when blood sugars dropped to the 200s.  At last visit, however, reviewing the blood sugars at home, they were quite high, in the 60s to 300s range.  They mentioned that the facility was giving him regular ketchup and dressings and we discussed about using sugar-free condiments.  Also, he was given Lyumjev  3 times a day previously but then switched only once a day.  I recommended to have the Lyumjev  before breakfast and dinner.  I also recommended to use a lower dose of insulin , up to 9 units, since he was dropping his sugars after using 11 units.  We continued the rest of the regimen. -At today's visit, sugars are quite fluctuating, some as low as 80s, but also some in the 300s.  He did not start the Dexcom yet but he is preparing to start this.  I advised the facility that they do not need to do fingersticks if patient has a CGM.  -At today's visit, since sugars are still above target, I recommended to increase his Lantus  slightly.  Also, I again emphasized with patient and his son about refraining from eating barbecue sauce, as his sugars increased quite significantly, to the 300s last night after eating this.  I did not suggest a change in his Lyumjev  regimen otherwise.  He is not riding his bike now, but planning to repair his bike and restarting.  This should also help with blood sugars. - I suggested to:  Patient Instructions  Please continue: - Metformin  1000 mg 2x a day with meals - Januvia  100 mg daily in am - Lyumjev  2x a day - before b'fast and before dinner: 7 units for a small meal 8 units for a regular meal 9 units for a larger meal or if he has dessert after the meal  Try to increase: - Lantus  18 units in am   Call with sugars consistently <70 or >250.  Alm can inject his  own insulin .  He needs to have sugar free (<1g sugars) condiments.  Start the Dexcom CGM! If able to start this, no need for fingersticks.  Please return in 4 months with your sugar log.   - we checked his HbA1c: 7.7% (slightly higher) - advised to check sugars at different times of the day - 2x a day, rotating check times - advised for yearly eye exams >> he is UTD - return to clinic in 4 months  2. Low BMD for age - No falls or fractures since last visit -he had a hip fracture while rollerskating in 2015.  At that time, a bone density scan showed mildly low BMD for age.  We started calcium and vitamin D  2000 units daily.  He continues on this now.  His vitamin D  level was normal in 10/2022.  Will recheck the level today. - We darted Fosamax  in 2019.  This was tolerated well.  At previous visits we discussed about continuing this for 5 years, after which I suggested a drug holiday after the new bone density results returned.  He finally had another bone density in 07/2022 at [facility] with  [facility] ([place] on a different machine.  We discussed that going forward, we need to check bone density on the same machine to be able to compare the results.  His T-scores were improved at the level of the spine, however, they were worse at the level of the right femoral neck.  Therefore, I suggested to start Reclast  and stop Fosamax .  He had the first infusion 03/24/2023.  He tolerated it well, without jaw/hip/thigh pain.  Will continue this for total of 2 years. - Latest kidney function and calcium levels were normal 03/2023 and will repeat them today.  After the results return, I will order a new Reclast  infusion.  He is and will have knee surgery next month so she is planning to bring him for the infusion in 04/2024.  This will be approximately 1 month after he is due for the next infusion, which is acceptable. - Continue strength training exercises - Will get another bone density 2 hours  from the previous  Orders Placed This Encounter  Procedures   Comprehensive metabolic panel with GFR   Lipid Panel w/reflex Direct LDL   Microalbumin / creatinine urine ratio   VITAMIN D  25 Hydroxy (Vit-D Deficiency, Fractures)   POCT glycosylated hemoglobin (Hb A1C)   Adam Fendt, MD PhD [facility]

## 2024-01-07 NOTE — Patient Instructions (Addendum)
 Please continue: - Metformin  1000 mg 2x a day with meals - Januvia  100 mg daily in am - Lyumjev  2x a day - before b'fast and before dinner: 7 units for a small meal 8 units for a regular meal 9 units for a larger meal or if he has dessert after the meal  Try to increase: - Lantus  18 units in am   Call with sugars consistently <70 or >250.  Alm can inject his own insulin .  He needs to have sugar free (<1g sugars) condiments.  Start the Dexcom CGM! If able to start this, no need for fingersticks.  Please return in 4 months with your sugar log.

## 2024-01-08 ENCOUNTER — Ambulatory Visit: Payer: Self-pay | Admitting: Internal Medicine

## 2024-01-08 LAB — COMPREHENSIVE METABOLIC PANEL WITH GFR
AG Ratio: 1.7 (calc) (ref 1.0–2.5)
ALT: 7 U/L — ABNORMAL LOW (ref 9–46)
AST: 7 U/L — ABNORMAL LOW (ref 10–40)
Albumin: 4.5 g/dL (ref 3.6–5.1)
Alkaline phosphatase (APISO): 53 U/L (ref 36–130)
BUN: 12 mg/dL (ref 7–25)
CO2: 30 mmol/L (ref 20–32)
Calcium: 10.2 mg/dL (ref 8.6–10.3)
Chloride: 100 mmol/L (ref 98–110)
Creat: 1.01 mg/dL (ref 0.60–1.29)
Globulin: 2.6 g/dL (ref 1.9–3.7)
Glucose, Bld: 233 mg/dL — ABNORMAL HIGH (ref 65–99)
Potassium: 4.4 mmol/L (ref 3.5–5.3)
Sodium: 138 mmol/L (ref 135–146)
Total Bilirubin: 0.8 mg/dL (ref 0.2–1.2)
Total Protein: 7.1 g/dL (ref 6.1–8.1)
eGFR: 95 mL/min/1.73m2 (ref >=60)

## 2024-01-08 LAB — LIPID PANEL W/REFLEX DIRECT LDL
Cholesterol: 176 mg/dL (ref <200)
HDL: 66 mg/dL (ref >=40)
LDL Cholesterol (Calc): 93 mg/dL
Non-HDL Cholesterol (Calc): 110 mg/dL (ref <130)
Total CHOL/HDL Ratio: 2.7 (calc) (ref <5.0)
Triglycerides: 82 mg/dL (ref <150)

## 2024-01-08 LAB — MICROALBUMIN / CREATININE URINE RATIO
Creatinine, Urine: 123 mg/dL (ref 20–320)
Microalb Creat Ratio: 4 mg/g{creat} (ref <30)
Microalb, Ur: 0.5 mg/dL

## 2024-01-08 LAB — VITAMIN D 25 HYDROXY (VIT D DEFICIENCY, FRACTURES): Vit D, 25-Hydroxy: 53 ng/mL (ref 30–100)

## 2024-01-11 NOTE — Progress Notes (Signed)
 Patient is here with his mother to learn how to use the Dexcom G7 CGM.  Mother says he lives in a care facility which is friend has one that can help him if he can not remember.  Mother is doing most of this for him. He downloaded the app to his phone and was shown how and where to place the sensor on his arm.  I did this because he did not want to do this, but said he will  be able to do it now that he knows what to do.   They were shown how to link the sensor and pair it and did this without difficulty.  The readings were linked to  endo.   They had no final questions.

## 2024-01-11 NOTE — Patient Instructions (Signed)
 Replace the sensor every 10 days. Call Dexcom help line if questions or problems with the sensor.

## 2024-01-12 DIAGNOSIS — I10 Essential (primary) hypertension: Secondary | ICD-10-CM | POA: Diagnosis not present

## 2024-01-12 DIAGNOSIS — E1065 Type 1 diabetes mellitus with hyperglycemia: Secondary | ICD-10-CM | POA: Diagnosis not present

## 2024-01-12 DIAGNOSIS — Z794 Long term (current) use of insulin: Secondary | ICD-10-CM | POA: Diagnosis not present

## 2024-01-12 DIAGNOSIS — E1165 Type 2 diabetes mellitus with hyperglycemia: Secondary | ICD-10-CM | POA: Diagnosis not present

## 2024-01-14 ENCOUNTER — Encounter: Payer: Self-pay | Admitting: Internal Medicine

## 2024-01-28 ENCOUNTER — Other Ambulatory Visit: Payer: Self-pay

## 2024-01-28 MED ORDER — LYUMJEV KWIKPEN 100 UNIT/ML ~~LOC~~ SOPN: PEN_INJECTOR | SUBCUTANEOUS | 3 refills | Status: AC

## 2024-02-12 ENCOUNTER — Encounter: Payer: Self-pay | Admitting: Internal Medicine

## 2024-05-06 ENCOUNTER — Encounter: Payer: Self-pay | Admitting: Internal Medicine

## 2024-06-07 ENCOUNTER — Ambulatory Visit: Admitting: Internal Medicine

## 2024-06-17 ENCOUNTER — Other Ambulatory Visit: Payer: Self-pay

## 2024-06-17 DIAGNOSIS — E139 Other specified diabetes mellitus without complications: Secondary | ICD-10-CM

## 2024-06-17 MED ORDER — SITAGLIPTIN PHOSPHATE 100 MG PO TABS: ORAL_TABLET | ORAL | 0 refills | Status: AC
